# Patient Record
Sex: Male | Born: 1945 | Race: White | Hispanic: No | Marital: Married | Smoking: Former smoker
Health system: Southern US, Community
[De-identification: ages and names within clinical notes are randomized; demographics above are authoritative.]

## PROBLEM LIST (undated history)

## (undated) DIAGNOSIS — G20 Parkinson's disease: Secondary | ICD-10-CM

## (undated) DIAGNOSIS — G20A1 Parkinson's disease without dyskinesia, without mention of fluctuations: Secondary | ICD-10-CM

## (undated) DIAGNOSIS — H269 Unspecified cataract: Secondary | ICD-10-CM

## (undated) DIAGNOSIS — K219 Gastro-esophageal reflux disease without esophagitis: Secondary | ICD-10-CM

## (undated) DIAGNOSIS — I2510 Atherosclerotic heart disease of native coronary artery without angina pectoris: Secondary | ICD-10-CM

## (undated) DIAGNOSIS — Z87442 Personal history of urinary calculi: Secondary | ICD-10-CM

## (undated) DIAGNOSIS — S71139A Puncture wound without foreign body, unspecified thigh, initial encounter: Secondary | ICD-10-CM

## (undated) DIAGNOSIS — I10 Essential (primary) hypertension: Secondary | ICD-10-CM

## (undated) DIAGNOSIS — E785 Hyperlipidemia, unspecified: Secondary | ICD-10-CM

## (undated) DIAGNOSIS — Z9109 Other allergy status, other than to drugs and biological substances: Secondary | ICD-10-CM

## (undated) DIAGNOSIS — W3400XA Accidental discharge from unspecified firearms or gun, initial encounter: Secondary | ICD-10-CM

## (undated) DIAGNOSIS — N200 Calculus of kidney: Secondary | ICD-10-CM

## (undated) HISTORY — PX: LEG WOUND REPAIR / CLOSURE: SUR1143

## (undated) HISTORY — DX: Puncture wound without foreign body, unspecified thigh, initial encounter: S71.139A

## (undated) HISTORY — PX: CARDIAC CATHETERIZATION: SHX172

## (undated) HISTORY — DX: Unspecified cataract: H26.9

## (undated) HISTORY — DX: Accidental discharge from unspecified firearms or gun, initial encounter: W34.00XA

## (undated) HISTORY — DX: Parkinson's disease without dyskinesia, without mention of fluctuations: G20.A1

## (undated) HISTORY — DX: Parkinson's disease: G20

## (undated) HISTORY — PX: COLONOSCOPY: SHX174

## (undated) HISTORY — PX: TONSILLECTOMY AND ADENOIDECTOMY: SHX28

---

## 1998-10-28 ENCOUNTER — Emergency Department (HOSPITAL_COMMUNITY): Admission: EM | Admit: 1998-10-28 | Discharge: 1998-10-28 | Payer: Self-pay | Admitting: Emergency Medicine

## 2000-05-29 ENCOUNTER — Other Ambulatory Visit: Admission: RE | Admit: 2000-05-29 | Discharge: 2000-05-29 | Payer: Self-pay | Admitting: Orthopedic Surgery

## 2003-06-19 ENCOUNTER — Encounter: Payer: Self-pay | Admitting: Internal Medicine

## 2006-07-30 ENCOUNTER — Ambulatory Visit: Payer: Self-pay | Admitting: Internal Medicine

## 2006-08-07 ENCOUNTER — Ambulatory Visit: Payer: Self-pay | Admitting: Internal Medicine

## 2009-08-01 ENCOUNTER — Encounter (INDEPENDENT_AMBULATORY_CARE_PROVIDER_SITE_OTHER): Payer: Self-pay | Admitting: *Deleted

## 2009-09-24 ENCOUNTER — Encounter (INDEPENDENT_AMBULATORY_CARE_PROVIDER_SITE_OTHER): Payer: Self-pay | Admitting: *Deleted

## 2009-10-03 ENCOUNTER — Telehealth: Payer: Self-pay | Admitting: Internal Medicine

## 2009-10-24 ENCOUNTER — Encounter (INDEPENDENT_AMBULATORY_CARE_PROVIDER_SITE_OTHER): Payer: Self-pay | Admitting: *Deleted

## 2009-10-25 ENCOUNTER — Ambulatory Visit: Payer: Self-pay | Admitting: Internal Medicine

## 2009-11-01 ENCOUNTER — Ambulatory Visit (HOSPITAL_COMMUNITY): Admission: RE | Admit: 2009-11-01 | Discharge: 2009-11-01 | Payer: Self-pay | Admitting: Internal Medicine

## 2009-11-01 ENCOUNTER — Ambulatory Visit: Payer: Self-pay | Admitting: Internal Medicine

## 2010-04-27 ENCOUNTER — Emergency Department (HOSPITAL_COMMUNITY): Admission: EM | Admit: 2010-04-27 | Discharge: 2010-04-27 | Payer: Self-pay | Admitting: Emergency Medicine

## 2010-09-10 NOTE — Letter (Signed)
Summary: Previsit letter  [facility]  [address] [place], [place] [address]   Phone: [phone]  Fax: [phone]       09/24/2009 MRN: [id number]  Randall Moore [address] [place], [place]  [address]  Dear [name],  Welcome to the Gastroenterology Division at [facility].    You are scheduled to see a nurse for your pre-procedure visit on 10-25-09 at 1:00p.m. on the 3rd floor at [facility], [address]  We ask that you try to arrive at our office 15 minutes prior to your appointment time to allow for check-in.  Your nurse visit will consist of discussing your medical and surgical history, your immediate family medical history, and your medications.    Please bring a complete list of all your medications or, if you prefer, bring the medication bottles and we will list them.  We will need to be aware of both prescribed and over the counter drugs.  We will need to know exact dosage information as well.  If you are on blood thinners (Coumadin, Plavix, Aggrenox, Ticlid, etc.) please call our office today/prior to your appointment, as we need to consult with your physician about holding your medication.   Please be prepared to read and sign documents such as consent forms, a financial agreement, and acknowledgement forms.  If necessary, and with your consent, a friend or relative is welcome to sit-in on the nurse visit with you.  Please bring your insurance card so that we may make a copy of it.  If your insurance requires a referral to see a specialist, please bring your referral form from your primary care physician.  No co-pay is required for this nurse visit.     If you cannot keep your appointment, please call [phone] to cancel or reschedule prior to your appointment date.  This allows [place] the opportunity to schedule an appointment for another patient in need of care.    Thank you for choosing [facility] for your medical needs.   We appreciate the opportunity to care for you.  Please visit [place] at our website  to learn more about our practice.                     Sincerely.                                                                                                                   The Gastroenterology Division

## 2010-09-10 NOTE — Procedures (Signed)
Summary: Colonoscopy   Colonoscopy  Procedure date:  08/07/2006  Findings:      Location:  [facility].  Results: Normal.   Comments:      Repeat colonoscopy in 3 years.     Procedures Next Due Date:    Colonoscopy: 08/2009 Patient Name: Randall Moore, Randall Moore. MRN:  Procedure Procedures: Colonoscopy CPT: [phone].  Personnel: Endoscopist: Wilhemina Bonito. Marina Goodell, MD.  Exam Location: Exam performed in Outpatient Clinic. Outpatient  Patient Consent: Procedure, Alternatives, Risks and Benefits discussed, consent obtained, from patient. Consent was obtained by the RN.  Indications  Surveillance of: Adenomatous Polyp(s). This is an initial surveillance exam. Initial polypectomy was performed in 2004. in Nov. 1-2 Polyps were found at Index Exam. Largest polyp removed was 6 to 9 mm. Prior polyp located in proximal (splenic flexure and beyond) colon. Pathology of worst  polyp: tubular adenoma.  History  Current Medications: Patient is not currently taking Coumadin.  Pre-Exam Physical: Performed Aug 07, 2006. Cardio-pulmonary exam, Rectal exam, HEENT exam , Abdominal exam, Mental status exam WNL.  Comments: Pt. history reviewed/updated, physical exam performed prior to initiation of sedation?yes Exam Exam: Extent of exam reached: Cecum, extent intended: Cecum.  The cecum was identified by IC valve. Patient position: on left side. Time to Cecum: 00:17:25. Time for Withdrawl: 00:10:36. Colon retroflexion performed. Images taken. ASA Classification: I. Tolerance: poor.  Monitoring: Pulse and BP monitoring, Oximetry used. Supplemental O2 given.  Colon Prep Used Movi prep for colon prep. Prep results: fair, exam compromised.  Sedation Meds: Patient assessed and found to be appropriate for moderate (conscious) sedation. Fentanyl 125 mcg. given IV. Versed 12 given IV.  Comments:    Findings NORMAL EXAM: Cecum to Rectum.    Comments: Exam significantly compromised due to  patient combativeness and inadequate prep Assessment Normal examination.  Comments: SEE LIMITATIONS ABOVE Events  Unplanned Interventions: No intervention was required.  Unplanned Events: There were no complications. Plans Disposition: After procedure patient sent to recovery. After recovery patient sent home.  Scheduling/Referral: Colonoscopy, to Wilhemina Bonito. Marina Goodell, MD, in 3 years (consider osmo prep and anesthesia assisted sedation),    This report was created from the original endoscopy report, which was reviewed and signed by the above listed endoscopist.   cc:  [facility].      The Patient

## 2010-09-10 NOTE — Miscellaneous (Signed)
Summary: [facility] PV  Clinical Lists Changes  Medications: Added new medication of OSMOPREP 1.102-0.398 GM  TABS (SOD PHOS MONO-SOD PHOS DIBASIC) As per prep instructions. - Signed Rx of OSMOPREP 1.102-0.398 GM  TABS (SOD PHOS MONO-SOD PHOS DIBASIC) As per prep instructions.;  #32 x 0;  Signed;  Entered by: Ezra Sites RN;  Authorized by: Hilarie Fredrickson MD;  Method used: [facility]  [place] [address], [place], [place]  [address], Ph: [phone] or [phone], Fax: [phone] Allergies: Added new allergy or adverse reaction of PCN Added new allergy or adverse reaction of IBUPROFEN Observations: Added new observation of NKA: F (10/25/2009 12:18)    Prescriptions: OSMOPREP 1.102-0.398 GM  TABS (SOD PHOS MONO-SOD PHOS DIBASIC) As per prep instructions.  #32 x 0   Entered by:   Ezra Sites RN   Authorized by:   Hilarie Fredrickson MD   Signed by:   Ezra Sites RN on 10/25/2009   Method used:   [facility]        [facility]  [place] [address] (retail)       [address]       [place], [place]  [address]       Ph: [phone] or [phone]       Fax: [phone]   RxID:   [id number]

## 2010-09-10 NOTE — Progress Notes (Signed)
Summary: Question about being put under for Recall Colon  Phone Note Call from Patient Call back at [phone] cell   Call For: Dr Marina Goodell Summary of Call: Scheduled his Recall Colon but believes that at his last procedure Dr Marina Goodell told him he might need to be put completely under for the next Colon. Should this be done at the hospital?  Initial call taken by: Leanor Kail [facility],  October 03, 2009 4:31 PM  Follow-up for Phone Call         Chart requested  Teryl Lucy RN  October 04, 2009 9:03 AM  Chart reviewed by Dr.Marva Hendryx and pt. will need colon under Propofol Dr.Deslyn Cavenaugh's next hospital wk.if there is an opening. OK with pt.  Teryl Lucy RN  October 04, 2009 12:52 PM  Pt. ntfd. that colon changed to [facility] on 11/01/2009 at 12:30pm  with Propofol.Instructed to keep PV appt. that is all ready scheduled. Follow-up by: Teryl Lucy RN,  October 04, 2009 3:27 PM

## 2010-09-10 NOTE — Letter (Signed)
Summary: Osmoprep Instructions  [facility]  [address] [place], [place] [address]   Phone: [phone]  Fax: [phone]       Randall Moore    1946/01/17    MRN: [id number]        Procedure Day /Date:  Thursday 11/01/2009     Arrival Time: 11:30 am     Procedure Time: 12:30 pm     Location of Procedure:                     _x _  [facility] ( Outpatient Registration)     PREPARATION FOR COLONOSCOPY WITH OSMOPREP  Starting 5 days prior to your procedure Saturday 3/19 do not eat nuts, seeds, popcorn, corn, beans, peas,  salads, or any raw vegetables.  Do not take any fiber supplements (e.g. Metamucil, Citrucel, and Benefiber). _________________________________________________________________________________________________  THE DAY BEFORE YOUR PROCEDURE             DATE: Wednesday 3/23  1.   Drink clear liquids the entire day - NO SOLID FOOD.  Drink at least 64 oz. of fluid during the day to prevent hydration and help the prep work efficiently.    2.   Do not drink anything colored red or purple.  Avoid juices with pulp.  No orange juice.              CLEAR LIQUIDS INCLUDE: Water Jello Ice Popsicles Tea (sugar ok, no milk/cream) Powdered fruit flavored drinks Coffee (sugar ok, no milk/cream) Gatorade Juice: apple, white grape, white cranberry  Lemonade Clear bullion, consomm, broth Carbonated beverages (any kind) Strained chicken noodle soup Hard Candy   3.   Beginning at 5:00 p.m. or 6:00 p.m. the night before your procedure, drink one dose (4 tablets with 8 oz. of any clear liquid) every 15 minutes for a total of 5 doses (20 tablets total).  ___________________________________________________________________________________________________   THE DAY OF YOUR PROCEDURE            DATE: Thursday 3/24  1.   Beginning at 7:30 am  (5 hours before procedure), drink one dose (4 tablets with 8 oz. of any clear liquid) every 15 minutes for a  total of 3 doses (12 tablets).  2.   Nothing to eat or drink after midnight except for prep.       MEDICATION INSTRUCTIONS  Unless otherwise instructed, you should take regular prescription medications with a small sip of water as early as possible the morning of your procedure.            OTHER INSTRUCTIONS  You will need a responsible adult at least 64 years of age to accompany you and drive you home.   This person must remain in the waiting room during your procedure.  Wear loose fitting clothing that is easily removed.  Leave jewelry and other valuables at home.  However, you may wish to bring a book to read or an iPod/MP3 player to listen to music as you wait for your procedure to start.  Remove all body piercing jewelry and leave at home.  Total time from sign-in until discharge is approximately 2-3 hours.  You should go home directly after your procedure and rest.  You can resume normal activities the day after your procedure.  The day of your procedure you should not:   Drive   Make legal decisions   Operate machinery   Drink alcohol   Return to work  [name]  will receive specific instructions about eating, activities and medications before you leave.   The above instructions have been reviewed and explained to me by  Ezra Sites RN  October 25, 2009 12:45 PM     I fully understand and can verbalize these instructions _____________________________ Date _________

## 2010-09-10 NOTE — Procedures (Signed)
Summary: colonoscopy   Colonoscopy  Procedure date:  06/19/2003  Findings:      Location:  [facility].  Results: Polyp.   Patient Name: Randall Moore, Randall Moore. MRN:  Procedure Procedures: Colonoscopy CPT: [phone].    with polypectomy. CPT: [id number].  Personnel: Endoscopist: Wilhemina Bonito. Marina Goodell, MD.  Referred By: Teena Irani. Arlyce Dice, MD.  Exam Location: Exam performed in Outpatient Clinic. Outpatient  Patient Consent: Procedure, Alternatives, Risks and Benefits discussed, consent obtained, from patient. Consent was obtained by the RN.  Indications  Average Risk Screening Routine.  History  Current Medications: Patient is not currently taking Coumadin.  Pre-Exam Physical: Performed Jun 19, 2003. Entire physical exam was normal.  Exam Exam: Extent of exam reached: Cecum, extent intended: Cecum.  The cecum was identified by appendiceal orifice and IC valve. Patient position: on left side. Colon retroflexion performed. Images taken. ASA Classification: II. Tolerance: excellent.  Monitoring: Pulse and BP monitoring, Oximetry used. Supplemental O2 given.  Colon Prep Used Golytely for colon prep. Prep results: excellent.  Sedation Meds: Patient assessed and found to be appropriate for moderate (conscious) sedation. Fentanyl 100 mcg. given IV. Versed 15 given IV.  Findings NORMAL EXAM: Cecum to Rectum.  POLYP: Sigmoid Colon, Maximum size: 8 mm. pedunculated polyp. Distance from Anus 30 cm. Procedure:  snare without cautery, removed, retrieved, Polyp sent to pathology. ICD9: Colon Polyps: 211.3.   Assessment Abnormal examination, see findings above.  Diagnoses: 211.3: Colon Polyps.   Events  Unplanned Interventions: No intervention was required.  Unplanned Events: There were no complications. Plans Patient Education: Patient given standard instructions for: Polyps.  Disposition: After procedure patient sent to recovery. After recovery patient sent home.    Scheduling/Referral: Colonoscopy, to Wilhemina Bonito. Marina Goodell, MD, in 3 years if polyp adenomatous; otherwise 5 years.,    This report was created from the original endoscopy report, which was reviewed and signed by the above listed endoscopist.   cc: Dara Hoyer, MD     The Patient

## 2010-09-10 NOTE — Procedures (Signed)
Summary: Colonoscopy  Patient: [name] Note: All result statuses are Final unless otherwise noted.  Tests: (1) Colonoscopy (COL)   COL Colonoscopy           DONE     [facility]     [address] [place], [place]  [address]           COLONOSCOPY PROCEDURE REPORT           PATIENT:  Randall Moore, Randall Moore  MR#:  [id number]     BIRTHDATE:  September 25, 1945, 63 yrs. old  GENDER:  male     ENDOSCOPIST:  Wilhemina Bonito. Eda Keys, MD     REF. BY:  Surveillance Program Recall     PROCEDURE DATE:  11/01/2009     PROCEDURE:  Surveillance Colonoscopy     ASA CLASS:  Class II     INDICATIONS:  history of pre-cancerous (adenomatous) colon polyps     ; INDEX 2004 TUBULAR ADENOMA; 2007 FAIR PREP     MEDICATIONS:   MAC sedation, administered by CRNA           DESCRIPTION OF PROCEDURE:   After the risks benefits and     alternatives of the procedure were thoroughly explained, informed     consent was obtained.  Digital rectal exam was performed and     revealed no abnormalities.   The EC-[id number] ([id number]) endoscope was     introduced through the anus and advanced to the cecum, which was     identified by both the appendix and ileocecal valve, without     limitations.  The quality of the prep was good, using Osmoprep.     The instrument was then slowly withdrawn as the colon was fully     examined.     <<PROCEDUREIMAGES>>           FINDINGS:  A normal appearing cecum, ileocecal valve, and     appendiceal orifice were identified. The ascending, hepatic     flexure, transverse, splenic flexure, descending, sigmoid colon,     and rectum appeared unremarkable.  No polyps or cancers were seen.     Retroflexed views in the rectum revealed internal hemorrhoids.     The scope was then withdrawn from the patient and the procedure     completed.           COMPLICATIONS:  None     ENDOSCOPIC IMPRESSION:     1) Normal colon     2) No polyps or cancers     3) Small Internal hemorrhoids        RECOMMENDATIONS:     1) Follow up colonoscopy in 5 years WITH MAC           ______________________________     Wilhemina Bonito. Eda Keys, MD           CC:  The Patient; [facility]           n.     eSIGNED:   Wilhemina Bonito. Eda Keys at 11/01/2009 01:33 PM           Charlestine Massed, [id number]  Note: An exclamation mark (!) indicates a result that was not dispersed into the flowsheet. Document Creation Date: 11/01/2009 1:33 PM _______________________________________________________________________  (1) Order result status: Final Collection or observation date-time: 11/01/2009 13:21 Requested date-time:  Receipt date-time:  Reported date-time:  Referring Physician:   Ordering Physician: Fransico Setters [phone]) Specimen Source:  Source: Launa Grill  Order Number: [id number] Lab site:

## 2010-10-24 LAB — CBC
MCH: 34.7 pg — ABNORMAL HIGH (ref 26.0–34.0)
MCV: 97.3 fL (ref 78.0–100.0)
Platelets: 178 10*3/uL (ref 150–400)
RDW: 13 % (ref 11.5–15.5)

## 2010-10-24 LAB — POCT I-STAT, CHEM 8
BUN: 25 mg/dL — ABNORMAL HIGH (ref 6–23)
Calcium, Ion: 1.08 mmol/L — ABNORMAL LOW (ref 1.12–1.32)
Creatinine, Ser: 1.6 mg/dL — ABNORMAL HIGH (ref 0.4–1.5)
TCO2: 24 mmol/L (ref 0–100)

## 2010-10-24 LAB — URINALYSIS, ROUTINE W REFLEX MICROSCOPIC
Bilirubin Urine: NEGATIVE
Ketones, ur: 15 mg/dL — AB
Nitrite: POSITIVE — AB
Urobilinogen, UA: 1 mg/dL (ref 0.0–1.0)

## 2011-01-15 ENCOUNTER — Encounter: Payer: Self-pay | Admitting: Internal Medicine

## 2011-09-20 ENCOUNTER — Encounter (HOSPITAL_COMMUNITY): Payer: Self-pay | Admitting: Emergency Medicine

## 2011-09-20 ENCOUNTER — Emergency Department (HOSPITAL_COMMUNITY): Payer: 59

## 2011-09-20 ENCOUNTER — Ambulatory Visit (INDEPENDENT_AMBULATORY_CARE_PROVIDER_SITE_OTHER): Payer: 59 | Admitting: Family Medicine

## 2011-09-20 ENCOUNTER — Observation Stay (HOSPITAL_COMMUNITY)
Admission: EM | Admit: 2011-09-20 | Discharge: 2011-09-21 | Disposition: A | Discharge status: Home / Self Care | Payer: 59 | Attending: Emergency Medicine | Admitting: Emergency Medicine

## 2011-09-20 ENCOUNTER — Other Ambulatory Visit: Payer: Self-pay

## 2011-09-20 DIAGNOSIS — E7800 Pure hypercholesterolemia, unspecified: Secondary | ICD-10-CM | POA: Insufficient documentation

## 2011-09-20 DIAGNOSIS — R079 Chest pain, unspecified: Secondary | ICD-10-CM

## 2011-09-20 DIAGNOSIS — K635 Polyp of colon: Secondary | ICD-10-CM | POA: Insufficient documentation

## 2011-09-20 DIAGNOSIS — N442 Benign cyst of testis: Secondary | ICD-10-CM | POA: Insufficient documentation

## 2011-09-20 DIAGNOSIS — H538 Other visual disturbances: Secondary | ICD-10-CM | POA: Insufficient documentation

## 2011-09-20 DIAGNOSIS — R0789 Other chest pain: Secondary | ICD-10-CM

## 2011-09-20 DIAGNOSIS — E785 Hyperlipidemia, unspecified: Secondary | ICD-10-CM | POA: Insufficient documentation

## 2011-09-20 DIAGNOSIS — G453 Amaurosis fugax: Secondary | ICD-10-CM

## 2011-09-20 DIAGNOSIS — K8020 Calculus of gallbladder without cholecystitis without obstruction: Secondary | ICD-10-CM

## 2011-09-20 DIAGNOSIS — H3400 Transient retinal artery occlusion, unspecified eye: Secondary | ICD-10-CM

## 2011-09-20 DIAGNOSIS — Z87442 Personal history of urinary calculi: Secondary | ICD-10-CM | POA: Insufficient documentation

## 2011-09-20 DIAGNOSIS — J309 Allergic rhinitis, unspecified: Secondary | ICD-10-CM | POA: Insufficient documentation

## 2011-09-20 HISTORY — DX: Other allergy status, other than to drugs and biological substances: Z91.09

## 2011-09-20 HISTORY — DX: Calculus of kidney: N20.0

## 2011-09-20 HISTORY — DX: Hyperlipidemia, unspecified: E78.5

## 2011-09-20 LAB — BASIC METABOLIC PANEL
BUN: 15 mg/dL (ref 6–23)
Calcium: 9.5 mg/dL (ref 8.4–10.5)
GFR calc Af Amer: 83 mL/min — ABNORMAL LOW (ref >90)
GFR calc non Af Amer: 71 mL/min — ABNORMAL LOW (ref >90)
Potassium: 3.9 mEq/L (ref 3.5–5.1)
Sodium: 139 mEq/L (ref 135–145)

## 2011-09-20 LAB — CBC
HCT: 38 % — ABNORMAL LOW (ref 39.0–52.0)
MCHC: 35 g/dL (ref 30.0–36.0)
RDW: 12.8 % (ref 11.5–15.5)

## 2011-09-20 LAB — POCT I-STAT TROPONIN I: Troponin i, poc: 0 ng/mL (ref 0.00–0.08)

## 2011-09-20 MED ORDER — NITROGLYCERIN 0.4 MG SL SUBL
0.4000 mg | SUBLINGUAL_TABLET | SUBLINGUAL | Status: DC | PRN
  Administered 2011-09-21: 0.4 mg via SUBLINGUAL
  Filled 2011-09-20 (×2): qty 25

## 2011-09-20 MED ORDER — NITROGLYCERIN 0.4 MG SL SUBL: 0.4000 mg | SUBLINGUAL_TABLET | SUBLINGUAL | Status: DC | PRN

## 2011-09-20 NOTE — ED Notes (Signed)
Pt reports this am around 830 he had blurred vision to right eye and a pressure behind his right eye. Pt states while he was at the urgent care he started having midsternal chest pressure. Pt reports he feels like he has to concentrate hard. Pt reports he had mild nausea and right facial numbness that has resolved. Pt was given 324asa. 1 nitro given. 20g(L)AC. 170/100 initial blood pressure.

## 2011-09-20 NOTE — ED Notes (Signed)
EKG given to Dr.J, no old EKG on file. Copy placed in chart.

## 2011-09-20 NOTE — ED Provider Notes (Signed)
Medical screening examination/treatment/procedure(s) were conducted as a shared visit with non-physician practitioner(s) and myself.  I personally evaluated the patient during the encounter  Yeimy Brabant, MD 09/20/11 1712 

## 2011-09-20 NOTE — ED Notes (Signed)
EDP at bedside  

## 2011-09-20 NOTE — Progress Notes (Signed)
66 yo former smoker who works for [employer].  He experienced sudden vision loss right eye this morning associated with dizziness and some chest pressure at 8:30 am.  He has subsequently felt all symptoms gradually resolve, although the vision is not back to normal yet.  Had mild nausea (unusual for him).  Also felt right facial numbness and lost his balance for awhile No h/o heart disease or CVA.  No prior amaurosis Took ASA 81 mg this am. Is treated for hyperlipidemia. F/H mother has a.fib  Patient seen urgently  O:  Alert, NAD, cooperative, appropriate, oriented CN:  Intact Motor:  Equal 4 extrem HEENT unremarkable Eyes:  Mild pallor right fundus.  EOMI, PERRLA Heart:  Reg, no tachycardia, no murmur Neck:  No bruit, no thyromegaly Chest:  Clear Abd: soft nontender without HSM Ext:  No edema  A: acute amaurosis with new chest tightness and nausea.  Suspect TIA +/- cardiac event  P:  IV access ASA 325 mg given Emergency tx to ED
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
## 2011-09-20 NOTE — ED Notes (Signed)
Pt resting quietly at present time. Denies any chest pain or blurred vision.

## 2011-09-20 NOTE — ED Notes (Signed)
Pt currently denies any chest pain or blurred vision. Pt denies any c/o at this time.

## 2011-09-20 NOTE — ED Provider Notes (Addendum)
Subjective :Developed blurred vision in both eyes right greater than left , and difficulty concentrating onset830 am this morning followed by anterior chest discomfort which is vague which was alleviated after one sublingual nitroglycerin and 3 baby aspirins. Visual problem has resolved completely Presently discomfort is minimal cardiac risk factors hypercholesterolemia. Obecttive On exam no distress lungs clear to auscultation heart regular rate and rhythm no murmurs. Neurologic cranial nerves II through XII grossly intact moves all extremities well motor is grossly 5 over 5 overall.  ED course the patient became pain-free after he received a second sublingual nitroglycerin tablet in the emergency department Assessment symptoms highly atypical for TIA . Plan :Based on symptoms will place in CDU for cardiac rule out protocol   Doug Sou, MD 09/20/11 1547  Doug Sou, MD 09/20/11 1719

## 2011-09-20 NOTE — ED Notes (Signed)
Received report assumed patient care walking rounds completed.  Patient sitting up in bed pain free,  Denies needs.

## 2011-09-20 NOTE — ED Provider Notes (Signed)
Patient is resting comfortably in bed without complaint. VSS. Patient is on CP protocol pending CT coronary for the morning. He he agreeable to protocol and voices understanding to alert staff if return of CP. Sign out given by Dr. Sherri Sear.   Jenness Corner, PA 09/20/11 1709  Jenness Corner, PA 09/20/11 2033

## 2011-09-21 ENCOUNTER — Other Ambulatory Visit: Payer: Self-pay

## 2011-09-21 ENCOUNTER — Encounter (HOSPITAL_COMMUNITY): Payer: Self-pay | Admitting: Physician Assistant

## 2011-09-21 ENCOUNTER — Observation Stay (HOSPITAL_COMMUNITY): Payer: 59

## 2011-09-21 MED ORDER — METOPROLOL TARTRATE 25 MG PO TABS
50.0000 mg | ORAL_TABLET | Freq: Once | ORAL | Status: AC
  Administered 2011-09-21: 50 mg via ORAL
  Filled 2011-09-21: qty 2

## 2011-09-21 MED ORDER — METOPROLOL TARTRATE 1 MG/ML IV SOLN
5.0000 mg | INTRAVENOUS | Status: DC | PRN
  Filled 2011-09-21: qty 10

## 2011-09-21 MED ORDER — IOHEXOL 350 MG/ML SOLN
80.0000 mL | Freq: Once | INTRAVENOUS | Status: AC | PRN
  Administered 2011-09-21: 80 mL via INTRAVENOUS

## 2011-09-21 NOTE — Consult Note (Signed)
Patient examined chart reviewed.  Reviewed cardiac CT.  Nonobstructive plaque in LAD 30% or less RCA and circumflex normal.  Neuro symptoms more concerning.  Plan per ER for that.  However I encouraged patient to F/U with Dr Jamelle Haring who has done lens work on him before.  Check eyes for embolic event.  Will do carotid dopplers in our office.  I will see him in 2 weeks and recheck BP and make sure he has F/U as he goes to the [place] when needed and has no primary in [place].  He has been seen at [facility] urgent care and I encouraged him to F/U there for primary care.  ASA for now.  [facility]  CT negative for acute event and no carotid bruits.

## 2011-09-21 NOTE — ED Notes (Signed)
Cardiology coming to see pt about results of ct scan.

## 2011-09-21 NOTE — ED Provider Notes (Signed)
Medical screening examination/treatment/procedure(s) were conducted as a shared visit with non-physician practitioner(s) and myself.  I personally evaluated the patient during the encounter  Doug Sou, MD 09/21/11 1434
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
## 2011-09-21 NOTE — ED Notes (Signed)
Transported to CT for Cardiac CT via w/c w/telemetry.

## 2011-09-21 NOTE — ED Notes (Signed)
Returned from CT. Pt tolerated well.

## 2011-10-01 NOTE — Progress Notes (Signed)
Observation review is complete for 09/20/2011.

## 2011-10-10 ENCOUNTER — Ambulatory Visit (INDEPENDENT_AMBULATORY_CARE_PROVIDER_SITE_OTHER): Payer: PRIVATE HEALTH INSURANCE | Admitting: Internal Medicine

## 2011-10-10 ENCOUNTER — Encounter: Payer: Self-pay | Admitting: Internal Medicine

## 2011-10-10 VITALS — BP 134/83 | HR 84 | Ht 71.0 in | Wt 183.0 lb

## 2011-10-10 DIAGNOSIS — H547 Unspecified visual loss: Secondary | ICD-10-CM

## 2011-10-10 DIAGNOSIS — I2510 Atherosclerotic heart disease of native coronary artery without angina pectoris: Secondary | ICD-10-CM

## 2011-10-10 DIAGNOSIS — E785 Hyperlipidemia, unspecified: Secondary | ICD-10-CM
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
## 2011-10-10 NOTE — Patient Instructions (Signed)
Your physician has requested that you have a carotid duplex. This test is an ultrasound of the carotid arteries in your neck. It looks at blood flow through these arteries that supply the brain with blood. Allow one hour for this exam. There are no restrictions or special instructions. 

## 2011-10-10 NOTE — Progress Notes (Signed)
Patient ID: Randall Moore, male   DOB: 1945-08-30, 66 y.o.   MRN: [id number]

## 2011-10-13 DIAGNOSIS — H547 Unspecified visual loss: Secondary | ICD-10-CM | POA: Insufficient documentation

## 2011-10-13 DIAGNOSIS — E785 Hyperlipidemia, unspecified: Secondary | ICD-10-CM | POA: Insufficient documentation

## 2011-10-13 DIAGNOSIS — I2510 Atherosclerotic heart disease of native coronary artery without angina pectoris: Secondary | ICD-10-CM | POA: Insufficient documentation

## 2011-10-13 NOTE — Assessment & Plan Note (Signed)
Needs to have carotid USN.  Should also be seen in ophthy, possibly neuro.   Keep on ASA

## 2011-10-13 NOTE — Assessment & Plan Note (Signed)
Patient with mild chol plaquing in coronaries.  I do not think his symptoms related to above event.   Does need aggressive control of risk factors.

## 2011-10-13 NOTE — Assessment & Plan Note (Signed)
Needs to have fasting lipids.

## 2011-10-27 ENCOUNTER — Encounter (INDEPENDENT_AMBULATORY_CARE_PROVIDER_SITE_OTHER): Payer: 59

## 2011-10-27 DIAGNOSIS — I6529 Occlusion and stenosis of unspecified carotid artery: Secondary | ICD-10-CM

## 2011-10-27 DIAGNOSIS — H53129 Transient visual loss, unspecified eye: Secondary | ICD-10-CM

## 2011-10-27 DIAGNOSIS — R209 Unspecified disturbances of skin sensation: Secondary | ICD-10-CM

## 2011-10-27 DIAGNOSIS — H547 Unspecified visual loss: Secondary | ICD-10-CM

## 2012-11-16 ENCOUNTER — Encounter (INDEPENDENT_AMBULATORY_CARE_PROVIDER_SITE_OTHER): Payer: 59

## 2012-11-16 DIAGNOSIS — I6529 Occlusion and stenosis of unspecified carotid artery: Secondary | ICD-10-CM

## 2013-04-06 ENCOUNTER — Ambulatory Visit (INDEPENDENT_AMBULATORY_CARE_PROVIDER_SITE_OTHER): Payer: Self-pay | Admitting: General Surgery

## 2013-04-07 ENCOUNTER — Encounter (INDEPENDENT_AMBULATORY_CARE_PROVIDER_SITE_OTHER): Payer: Self-pay | Admitting: General Surgery

## 2013-04-07 ENCOUNTER — Ambulatory Visit (INDEPENDENT_AMBULATORY_CARE_PROVIDER_SITE_OTHER): Payer: 59 | Admitting: General Surgery

## 2013-04-07 VITALS — BP 160/92 | HR 72 | Temp 96.7°F | Resp 14 | Ht 72.0 in | Wt 175.2 lb

## 2013-04-07 DIAGNOSIS — M6200 Separation of muscle (nontraumatic), unspecified site: Secondary | ICD-10-CM

## 2013-04-07 DIAGNOSIS — M6208 Separation of muscle (nontraumatic), other site: Secondary | ICD-10-CM | POA: Insufficient documentation

## 2013-04-07 NOTE — Patient Instructions (Signed)
The bulge in your upper abdomen is a condition called diastases recti.  This is not a true hernia, and we do not recommend surgery to repair this.  You have been given a prescription for a Velcro elastic abdominal binder to see if that helps with the discomfort.  Return to see Dr. Derrell Lolling if your symptoms progress or if the bulge progresses.
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
## 2013-06-21 ENCOUNTER — Encounter: Payer: Self-pay | Admitting: Family Medicine

## 2013-12-05 ENCOUNTER — Other Ambulatory Visit (HOSPITAL_COMMUNITY): Payer: Self-pay | Admitting: Cardiology

## 2013-12-05 DIAGNOSIS — I6529 Occlusion and stenosis of unspecified carotid artery: Secondary | ICD-10-CM

## 2013-12-12 ENCOUNTER — Ambulatory Visit (HOSPITAL_COMMUNITY): Payer: 59 | Attending: Cardiovascular Disease | Admitting: Cardiology

## 2013-12-12 DIAGNOSIS — I6529 Occlusion and stenosis of unspecified carotid artery: Secondary | ICD-10-CM

## 2013-12-12 DIAGNOSIS — I2510 Atherosclerotic heart disease of native coronary artery without angina pectoris: Secondary | ICD-10-CM | POA: Insufficient documentation

## 2013-12-12 DIAGNOSIS — E785 Hyperlipidemia, unspecified: Secondary | ICD-10-CM | POA: Insufficient documentation

## 2013-12-12 DIAGNOSIS — I658 Occlusion and stenosis of other precerebral arteries: Secondary | ICD-10-CM | POA: Insufficient documentation

## 2013-12-12 DIAGNOSIS — Z87891 Personal history of nicotine dependence: Secondary | ICD-10-CM | POA: Insufficient documentation

## 2013-12-12 NOTE — Progress Notes (Signed)
Carotid duplex complete 

## 2016-03-19 DIAGNOSIS — R079 Chest pain, unspecified: Secondary | ICD-10-CM | POA: Diagnosis present

## 2016-03-19 NOTE — H&P (Signed)
OFFICE VISIT NOTES COPIED TO EPIC FOR DOCUMENTATION  . History of Present Illness Randall Moore AGNP-C; 03/19/2016 11:48 AM) The patient is a 70 year old male who presents with chest pain. He has a history of hyperlipidemia. He saw his PCP this morning for evaluation of intermittent episodes of palpitations and chest pain over the past 3-4 days. Patient typically very active and runs several miles regularly, however is now experiencing chest discomfort with even minimal amounts of exertion such as walking across the parking lot into our office. Denies any associated shortness of breath, nausea, diaphoresis, or dizziness. Denies any radiation of pain. Symptoms resolve with rest. He describes palpitations as a sensation of a skipped beat and feels a fullness in his chest or head during this. He denies any PND, orthopnea, edema, dizziness, syncope, or symptoms suggestive of TIA. He also reports symptoms suggestive of claudication in his right thigh with exertion.   Problem List/Past Medical Randall Moore; 04/08/2016 3:47 PM) Hypercholesteremia (E78.00)  Seasonal allergies (J30.2)  History of kidney stones [id number])   Allergies Randall Moore; April 08, 2016 3:40 PM) Penicillins  Ibuprofen *ANALGESICS - ANTI-INFLAMMATORY*   Family History Randall Moore; 2016/04/08 3:42 PM) Mother  Deceased. at age 59, afib Father  Deceased. at age 54, mesothelioma, no known heart conditions Sister 1  1 year younger, Hypercholesteremia  Social History Randall Moore; 2016/04/08 3:43 PM) Current tobacco use  quit in 1976, smoked for about 8 years Non Drinker/No Alcohol Use  Marital status  Married. Living Situation  Lives with spouse. Number of Children  2.  Past Surgical History Randall Moore; 2016/04/08 3:44 PM) Adenoidectomy 1958 Tonsillectomy 1958  Medication History Randall Moore; April 08, 2016 3:54 PM) Simvastatin (40MG  Tablet, 1 Oral daily) Active. Clarinex (5MG   Tablet, 1 Oral daily) Active. Clobetasol Propionate (0.05% Ointment, External as needed) Active. (for bug bites) Omega 3 (1 gram Oral daily) Specific dose unknown - Active. Aspirin (81MG  Tablet DR, 1 Oral daily) Active. Saw Palmetto (1 Oral daily) Specific dose unknown - Active. Multivital (1 Oral daily) Active. Calcium Acetate (500 mg Oral daily) Specific dose unknown - Active. Desloratadine (5MG  Tablet, 1 Oral daily) Active. Docusate Sodium (1 Oral daily) Specific dose unknown - Active. Ferrous Sulfate (1 Oral daily) Specific dose unknown - Active. Medications Reconciled (List present)  Diagnostic Studies History Randall Moore, AGNP-C; 08-Apr-2016 4:12 PM) Carotid Doppler 12/12/2013 1-39% bilateral ICA stenosis, mild right subclavian artery stenosis Colonoscopy 2008 Normal.    Review of Systems (Randall Moore AGNP-C; 2016-04-08 4:15 PM) General Not Present- Anorexia, Fatigue and Fever. Respiratory Not Present- Cough, Decreased Exercise Tolerance, Difficulty Breathing on Exertion and Dyspnea. Cardiovascular Present- Chest Pain and Claudications (right thigh). Not Present- Edema, Orthopnea, Palpitations and Paroxysmal Nocturnal Dyspnea. Gastrointestinal Not Present- Black, Tarry Stool, Change in Bowel Habits and Nausea. Neurological Not Present- Focal Neurological Symptoms and Syncope. Endocrine Not Present- Cold Intolerance, Excessive Sweating, Heat Intolerance and Thyroid Problems. Hematology Not Present- Anemia, Easy Bruising, Petechiae and Prolonged Bleeding.  Vitals Randall Moore; April 08, 2016 3:56 PM) Apr 08, 2016 3:25 PM Weight: 173.44 lb Height: 72in Body Surface Area: 2.01 m Body Mass Index: 23.52 kg/m  Pulse: 65 (Regular)  P.OX: 98% (Room air) BP: 140/80 (Sitting, Left Arm, Standard)       Physical Exam (Randall Moore AGNP-C; 2016-04-08 4:16 PM) General Mental Status-Alert. General Appearance-Cooperative, Appears stated age, Not in  acute distress. Build & Nutrition-Moderately built.  Head and Neck Thyroid Gland Characteristics - no palpable nodules, no palpable enlargement.  Chest and Lung Exam Palpation Tender - No  chest wall tenderness. Auscultation Breath sounds - Clear.  Cardiovascular Inspection Jugular vein - Right - No Distention. Auscultation Heart Sounds - S1 WNL, S2 WNL and No gallop present. Murmurs & Other Heart Sounds - Murmur - No murmur.  Abdomen Palpation/Percussion Palpation and Percussion of the abdomen reveal - Non Tender and No hepatosplenomegaly. Auscultation Auscultation of the abdomen reveals - Bowel sounds normal.  Peripheral Vascular Lower Extremity Inspection - Bilateral - Inspection Normal. Palpation - Edema - Bilateral - No edema. Femoral pulse - Bilateral - Normal. Popliteal pulse - Bilateral - Normal. Dorsalis pedis pulse - Bilateral - Normal. Posterior tibial pulse - Left - Normal. Right - 1+. Carotid arteries - Bilateral-No Carotid bruit. Abdomen-No prominent abdominal aortic pulsation, No epigastric bruit.  Neurologic Neurologic evaluation reveals -alert and oriented x 3 with no impairment of recent or remote memory. Motor-Grossly intact without any focal deficits.  Musculoskeletal Global Assessment Left Lower Extremity - normal range of motion without pain. Right Lower Extremity - normal range of motion without pain.    Assessment & Plan (Randall Moore AGNP-C; 03/19/2016 11:49 AM) 1. 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 x1. Started Nitroglycerin 0.4MG , 1 (one) Tablet every 5 minutes as needed for chest pain., #25, 03/18/2016, No Refill. Started AmLODIPine Besylate 5MG , 1 (one) Tablet daily, #30, 03/18/2016, Ref. x1. METABOLIC PANEL, BASIC (99991111) CBC & PLATELETS (AUTO) (85027) PT (PROTHROMBIN TIME) (09811) 2. 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 Palpitations (R00.2) 4. Hyperlipidemia, group A (E78.00) Current Plans Started Atorvastatin Calcium 80MG , 1 (one) Tablet daily, #30, 03/18/2016, No Refill. Local Order: d/c simvastatin Discontinued Simvastatin 40MG  (change to atorvastatin). 5. 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 I discussed primary/secondary prevention and also dietary counseling was done. He presents for evaluation of intermittent episodes of chest pain, strongly suggestive of unstable angina pectoris. Symptoms occur with even minimal amounts of exertion such as walking across the parking lot. Given presentation in a patient with established hyperlipidemia and carotid atherosclerosis, would recommend proceeding with coronary angiography for definitive evaluation of coronary anatomy. We will began metoprolol tartrate 12.5 mg twice a day for cardioprotection and amlodipine 5 mg both for hypertension and angina. Was also given sublingual nitroglycerin to use for chest pain not relieved by rest. I have also discontinued simvastatin and changed to 80 mg atorvastatin. Schedule for cardiac catheterization, and possible angioplasty. We discussed regarding risks, benefits, alternatives to this including stress testing, CTA and continued medical therapy. Patient wants to proceed. Understands <1-2% risk of death, stroke, MI, urgent CABG, bleeding, infection, renal failure but not limited to these. Follow up after catheterization for reevaluation and further recommendations.  *I have discussed this case with Dr. Einar Gip and he personally examined the patient and participated in formulating the plan.*  Addendum Note(Randall Moore AGNP-C; 03/19/2016 7:35 PM) 03/19/2016: creatinine 1.11, potassium 4.8, CBC normal, PT/INR normal  Labs stable for coronary angiogram.   Signed by Randall Moore, AGNP-C (03/19/2016 11:49 AM)

## 2016-03-21 ENCOUNTER — Encounter (HOSPITAL_COMMUNITY)
Admission: RE | Disposition: A | Discharge status: Home / Self Care | Payer: Self-pay | Source: Ambulatory Visit | Attending: Cardiology

## 2016-03-21 ENCOUNTER — Ambulatory Visit (HOSPITAL_COMMUNITY)
Admission: RE | Admit: 2016-03-21 | Discharge: 2016-03-21 | Disposition: A | Discharge status: Home / Self Care | Payer: 59 | Source: Ambulatory Visit | Attending: Cardiology | Admitting: Cardiology

## 2016-03-21 DIAGNOSIS — E7800 Pure hypercholesterolemia, unspecified: Secondary | ICD-10-CM | POA: Diagnosis not present

## 2016-03-21 DIAGNOSIS — Z7982 Long term (current) use of aspirin: Secondary | ICD-10-CM | POA: Insufficient documentation

## 2016-03-21 DIAGNOSIS — R079 Chest pain, unspecified: Secondary | ICD-10-CM | POA: Diagnosis present

## 2016-03-21 DIAGNOSIS — J302 Other seasonal allergic rhinitis: Secondary | ICD-10-CM | POA: Insufficient documentation

## 2016-03-21 DIAGNOSIS — I209 Angina pectoris, unspecified: Secondary | ICD-10-CM | POA: Diagnosis present

## 2016-03-21 DIAGNOSIS — E785 Hyperlipidemia, unspecified: Secondary | ICD-10-CM | POA: Insufficient documentation

## 2016-03-21 DIAGNOSIS — I2584 Coronary atherosclerosis due to calcified coronary lesion: Secondary | ICD-10-CM | POA: Diagnosis not present

## 2016-03-21 DIAGNOSIS — Z87891 Personal history of nicotine dependence: Secondary | ICD-10-CM | POA: Diagnosis not present

## 2016-03-21 DIAGNOSIS — I25119 Atherosclerotic heart disease of native coronary artery with unspecified angina pectoris: Secondary | ICD-10-CM | POA: Insufficient documentation

## 2016-03-21 DIAGNOSIS — Z79899 Other long term (current) drug therapy: Secondary | ICD-10-CM | POA: Insufficient documentation

## 2016-03-21 HISTORY — PX: CARDIAC CATHETERIZATION: SHX172

## 2016-03-21 LAB — POCT ACTIVATED CLOTTING TIME: ACTIVATED CLOTTING TIME: 246 s

## 2016-03-21 SURGERY — LEFT HEART CATH AND CORONARY ANGIOGRAPHY

## 2016-03-21 MED ORDER — IOPAMIDOL (ISOVUE-370) INJECTION 76%
INTRAVENOUS | Status: AC
  Filled 2016-03-21: qty 50

## 2016-03-21 MED ORDER — ASPIRIN 81 MG PO CHEW
81.0000 mg | CHEWABLE_TABLET | ORAL | Status: AC
  Administered 2016-03-21: 81 mg via ORAL

## 2016-03-21 MED ORDER — ADENOSINE (DIAGNOSTIC) 140MCG/KG/MIN
INTRAVENOUS | Status: DC | PRN
  Administered 2016-03-21: 140 ug/kg/min via INTRAVENOUS

## 2016-03-21 MED ORDER — SODIUM CHLORIDE 0.9% FLUSH: 3.0000 mL | Freq: Two times a day (BID) | INTRAVENOUS | Status: DC

## 2016-03-21 MED ORDER — SODIUM CHLORIDE 0.9 % WEIGHT BASED INFUSION: 3.0000 mL/kg/h | INTRAVENOUS | Status: AC

## 2016-03-21 MED ORDER — MIDAZOLAM HCL 2 MG/2ML IJ SOLN
INTRAMUSCULAR | Status: DC | PRN
  Administered 2016-03-21: 2 mg via INTRAVENOUS
  Administered 2016-03-21: 1 mg via INTRAVENOUS

## 2016-03-21 MED ORDER — HYDROMORPHONE HCL 1 MG/ML IJ SOLN
INTRAMUSCULAR | Status: DC | PRN
  Administered 2016-03-21: 0.5 mg via INTRAVENOUS

## 2016-03-21 MED ORDER — VERAPAMIL HCL 2.5 MG/ML IV SOLN
INTRAVENOUS | Status: AC
  Filled 2016-03-21: qty 2

## 2016-03-21 MED ORDER — IOPAMIDOL (ISOVUE-370) INJECTION 76%
INTRAVENOUS | Status: AC
  Filled 2016-03-21: qty 100

## 2016-03-21 MED ORDER — HEPARIN SODIUM (PORCINE) 1000 UNIT/ML IJ SOLN
INTRAMUSCULAR | Status: DC | PRN
  Administered 2016-03-21: 8000 [IU] via INTRAVENOUS
  Administered 2016-03-21: 1000 [IU] via INTRAVENOUS
  Administered 2016-03-21: 1500 [IU] via INTRAVENOUS

## 2016-03-21 MED ORDER — VERAPAMIL HCL 2.5 MG/ML IV SOLN
INTRA_ARTERIAL | Status: DC | PRN
  Administered 2016-03-21: 10 mL via INTRA_ARTERIAL

## 2016-03-21 MED ORDER — ADENOSINE 12 MG/4ML IV SOLN
INTRAVENOUS | Status: AC
  Filled 2016-03-21: qty 4

## 2016-03-21 MED ORDER — SODIUM CHLORIDE 0.9 % IV SOLN: 250.0000 mL | INTRAVENOUS | Status: DC | PRN

## 2016-03-21 MED ORDER — IOPAMIDOL (ISOVUE-370) INJECTION 76%
INTRAVENOUS | Status: DC | PRN
  Administered 2016-03-21: 110 mL via INTRAVENOUS

## 2016-03-21 MED ORDER — LIDOCAINE HCL (PF) 1 % IJ SOLN
INTRAMUSCULAR | Status: AC
  Filled 2016-03-21: qty 30

## 2016-03-21 MED ORDER — ASPIRIN 81 MG PO CHEW
CHEWABLE_TABLET | ORAL | Status: AC
  Filled 2016-03-21: qty 1

## 2016-03-21 MED ORDER — SODIUM CHLORIDE 0.9% FLUSH: 3.0000 mL | INTRAVENOUS | Status: DC | PRN

## 2016-03-21 MED ORDER — HEPARIN (PORCINE) IN NACL 2-0.9 UNIT/ML-% IJ SOLN
INTRAMUSCULAR | Status: AC
  Filled 2016-03-21: qty 1000

## 2016-03-21 MED ORDER — SODIUM CHLORIDE 0.9 % WEIGHT BASED INFUSION: 1.0000 mL/kg/h | INTRAVENOUS | Status: DC

## 2016-03-21 MED ORDER — HEPARIN SODIUM (PORCINE) 1000 UNIT/ML IJ SOLN
INTRAMUSCULAR | Status: AC
  Filled 2016-03-21: qty 1

## 2016-03-21 MED ORDER — NITROGLYCERIN 1 MG/10 ML FOR IR/CATH LAB
INTRA_ARTERIAL | Status: AC
  Filled 2016-03-21: qty 10

## 2016-03-21 MED ORDER — HYDROMORPHONE HCL 1 MG/ML IJ SOLN
INTRAMUSCULAR | Status: AC
  Filled 2016-03-21: qty 1

## 2016-03-21 MED ORDER — SODIUM CHLORIDE 0.9 % WEIGHT BASED INFUSION
3.0000 mL/kg/h | INTRAVENOUS | Status: DC
  Administered 2016-03-21: 3 mL/kg/h via INTRAVENOUS

## 2016-03-21 MED ORDER — HEPARIN (PORCINE) IN NACL 2-0.9 UNIT/ML-% IJ SOLN
INTRAMUSCULAR | Status: DC | PRN
  Administered 2016-03-21: 1000 mL

## 2016-03-21 MED ORDER — MIDAZOLAM HCL 2 MG/2ML IJ SOLN
INTRAMUSCULAR | Status: AC
  Filled 2016-03-21: qty 2

## 2016-03-21 MED ORDER — LIDOCAINE HCL (PF) 1 % IJ SOLN
INTRAMUSCULAR | Status: DC | PRN
  Administered 2016-03-21: 2 mL

## 2016-03-21 SURGICAL SUPPLY — 11 items
CATH OPTITORQUE TIG 4.0 5F (CATHETERS) ×1 IMPLANT
CATH VISTA GUIDE 6FR XBLAD3.5 (CATHETERS) ×1 IMPLANT
DEVICE RAD COMP TR BAND LRG (VASCULAR PRODUCTS) ×1 IMPLANT
GLIDESHEATH SLEND A-KIT 6F 20G (SHEATH) ×2 IMPLANT
GUIDEWIRE PRESSURE COMET II (WIRE) ×1 IMPLANT
KIT ESSENTIALS PG (KITS) ×1 IMPLANT
KIT HEART LEFT (KITS) ×2 IMPLANT
PACK CARDIAC CATHETERIZATION (CUSTOM PROCEDURE TRAY) ×2 IMPLANT
TRANSDUCER W/STOPCOCK (MISCELLANEOUS) ×2 IMPLANT
TUBING CIL FLEX 10 FLL-RA (TUBING) ×2 IMPLANT
WIRE SAFE-T 1.5MM-J .035X260CM (WIRE) ×1 IMPLANT

## 2016-03-21 NOTE — Interval H&P Note (Signed)
History and Physical Interval Note:  03/21/2016 7:46 AM  [name]  has presented today for surgery, with the diagnosis of angina  The various methods of treatment have been discussed with the patient and family. After consideration of risks, benefits and other options for treatment, the patient has consented to  Procedure(s): Left Heart Cath and Coronary Angiography (N/A) and possible PCI  as a surgical intervention .  The patient's history has been reviewed, patient examined, no change in status, stable for surgery.  I have reviewed the patient's chart and labs.  Questions were answered to the patient's satisfaction.    Ischemic Symptoms? CCS III (Marked limitation of ordinary activity) Anti-ischemic Medical Therapy? Minimal Therapy (1 class of medications) Non-invasive Test Results? No non-invasive testing performed Prior CABG? No Previous CABG   Patient Information:   1-2V CAD, no prox LAD  A (7)  Indication: 20; Score: 7   Patient Information:   1-2V-CAD with DS 50-60% With No FFR, No IVUS  I (3)  Indication: 21; Score: 3   Patient Information:   1-2V-CAD with DS 50-60% With FFR  A (7)  Indication: 22; Score: 7   Patient Information:   1-2V-CAD with DS 50-60% With FFR>0.8, IVUS not significant  I (2)  Indication: 23; Score: 2   Patient Information:   3V-CAD without LMCA With Abnormal LV systolic function  A (9)  Indication: 48; Score: 9   Patient Information:   LMCA-CAD  A (9)  Indication: 49; Score: 9   Patient Information:   2V-CAD with prox LAD PCI  A (7)  Indication: 62; Score: 7   Patient Information:   2V-CAD with prox LAD CABG  A (8)  Indication: 62; Score: 8   Patient Information:   3V-CAD without LMCA With Low CAD burden(i.e., 3 focal stenoses, low SYNTAX score) PCI  A (7)  Indication: 63; Score: 7   Patient Information:   3V-CAD without LMCA With Low CAD burden(i.e., 3 focal stenoses, low SYNTAX  score) CABG  A (9)  Indication: 63; Score: 9   Patient Information:   3V-CAD without LMCA E06c - Intermediate-high CAD burden (i.e., multiple diffuse lesions, presence of CTO, or high SYNTAX score) PCI  U (4)  Indication: 64; Score: 4   Patient Information:   3V-CAD without LMCA E06c - Intermediate-high CAD burden (i.e., multiple diffuse lesions, presence of CTO, or high SYNTAX score) CABG  A (9)  Indication: 64; Score: 9   Patient Information:   LMCA-CAD With Isolated LMCA stenosis  PCI  U (6)  Indication: 65; Score: 6   Patient Information:   LMCA-CAD With Isolated LMCA stenosis  CABG  A (9)  Indication: 65; Score: 9   Patient Information:   LMCA-CAD Additional CAD, low CAD burden (i.e., 1- to 2-vessel additional involvement, low SYNTAX score) PCI  U (5)  Indication: 66; Score: 5   Patient Information:   LMCA-CAD Additional CAD, low CAD burden (i.e., 1- to 2-vessel additional involvement, low SYNTAX score) CABG  A (9)  Indication: 66; Score: 9   Patient Information:   LMCA-CAD Additional CAD, intermediate-high CAD burden (i.e., 3-vessel involvement, presence of CTO, or high SYNTAX score) PCI  I (3)  Indication: 67; Score: 3   Patient Information:   LMCA-CAD Additional CAD, intermediate-high CAD burden (i.e., 3-vessel involvement, presence of CTO, or high SYNTAX score) CABG  A (9)  Indication: 67; Score: 9  Randall Moore

## 2016-03-21 NOTE — Discharge Instructions (Signed)

## 2016-03-24 ENCOUNTER — Encounter (HOSPITAL_COMMUNITY): Payer: Self-pay | Admitting: Cardiology

## 2016-07-01 ENCOUNTER — Encounter (INDEPENDENT_AMBULATORY_CARE_PROVIDER_SITE_OTHER): Payer: Self-pay

## 2016-07-01 ENCOUNTER — Ambulatory Visit (INDEPENDENT_AMBULATORY_CARE_PROVIDER_SITE_OTHER): Payer: 59 | Admitting: Physician Assistant

## 2016-07-01 ENCOUNTER — Encounter: Payer: Self-pay | Admitting: Physician Assistant

## 2016-07-01 VITALS — BP 130/72 | HR 64 | Ht 71.0 in | Wt 179.0 lb

## 2016-07-01 DIAGNOSIS — Z1211 Encounter for screening for malignant neoplasm of colon: Secondary | ICD-10-CM | POA: Diagnosis not present

## 2016-07-01 DIAGNOSIS — Z1212 Encounter for screening for malignant neoplasm of rectum: Secondary | ICD-10-CM

## 2016-07-01 DIAGNOSIS — R1032 Left lower quadrant pain: Secondary | ICD-10-CM | POA: Diagnosis not present

## 2016-07-01 MED ORDER — CIPROFLOXACIN HCL 500 MG PO TABS: 500.0000 mg | ORAL_TABLET | Freq: Two times a day (BID) | ORAL | 0 refills | Status: DC

## 2016-07-01 MED ORDER — METRONIDAZOLE 500 MG PO TABS: 500.0000 mg | ORAL_TABLET | Freq: Three times a day (TID) | ORAL | 0 refills | Status: DC

## 2016-07-01 MED ORDER — NA SULFATE-K SULFATE-MG SULF 17.5-3.13-1.6 GM/177ML PO SOLN: 1.0000 | ORAL | 0 refills | Status: DC

## 2016-07-01 NOTE — Progress Notes (Signed)
Agree with initial assessment and plans. Recommend CT if problem persists or worsens despite therapy

## 2016-07-01 NOTE — Patient Instructions (Addendum)
You have been scheduled for a colonoscopy. Please follow written instructions given to you at your visit today.  Please pick up your prep supplies at the pharmacy within the next 1-3 days. If you use inhalers (even only as needed), please bring them with you on the day of your procedure.  We have sent the following medications to your pharmacy for you to pick up at your convenience: Cipro 500 mg twice a day for 10 days Flagyl 500 mg three times a day for 10 days
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
## 2016-09-09 ENCOUNTER — Encounter: Payer: Self-pay | Admitting: Internal Medicine

## 2016-09-09 ENCOUNTER — Ambulatory Visit (AMBULATORY_SURGERY_CENTER): Payer: 59 | Admitting: Internal Medicine

## 2016-09-09 VITALS — BP 100/49 | HR 55 | Temp 98.0°F | Resp 16 | Ht 71.0 in | Wt 179.0 lb

## 2016-09-09 DIAGNOSIS — D125 Benign neoplasm of sigmoid colon: Secondary | ICD-10-CM | POA: Diagnosis not present

## 2016-09-09 DIAGNOSIS — Z1212 Encounter for screening for malignant neoplasm of rectum: Secondary | ICD-10-CM | POA: Diagnosis not present

## 2016-09-09 DIAGNOSIS — D123 Benign neoplasm of transverse colon: Secondary | ICD-10-CM

## 2016-09-09 DIAGNOSIS — K635 Polyp of colon: Secondary | ICD-10-CM

## 2016-09-09 DIAGNOSIS — Z1211 Encounter for screening for malignant neoplasm of colon: Secondary | ICD-10-CM

## 2016-09-09 MED ORDER — SODIUM CHLORIDE 0.9 % IV SOLN: 500.0000 mL | INTRAVENOUS | Status: DC

## 2016-09-09 NOTE — Patient Instructions (Signed)
HANDOUT GIVEN : POLYPS   YOU HAD AN ENDOSCOPIC PROCEDURE TODAY AT THE  ENDOSCOPY CENTER:   Refer to the procedure report that was given to you for any specific questions about what was found during the examination.  If the procedure report does not answer your questions, please call your gastroenterologist to clarify.  If you requested that your care partner not be given the details of your procedure findings, then the procedure report has been included in a sealed envelope for you to review at your convenience later.  YOU SHOULD EXPECT: Some feelings of bloating in the abdomen. Passage of more gas than usual.  Walking can help get rid of the air that was put into your GI tract during the procedure and reduce the bloating. If you had a lower endoscopy (such as a colonoscopy or flexible sigmoidoscopy) you may notice spotting of blood in your stool or on the toilet paper. If you underwent a bowel prep for your procedure, you may not have a normal bowel movement for a few days.  Please Note:  You might notice some irritation and congestion in your nose or some drainage.  This is from the oxygen used during your procedure.  There is no need for concern and it should clear up in a day or so.  SYMPTOMS TO REPORT IMMEDIATELY:   Following lower endoscopy (colonoscopy or flexible sigmoidoscopy):  Excessive amounts of blood in the stool  Significant tenderness or worsening of abdominal pains  Swelling of the abdomen that is new, acute  Fever of 100F or higher   For urgent or emergent issues, a gastroenterologist can be reached at any hour by calling [phone].   DIET:  We do recommend a small meal at first, but then you may proceed to your regular diet.  Drink plenty of fluids but you should avoid alcoholic beverages for 24 hours.  ACTIVITY:  You should plan to take it easy for the rest of today and you should NOT DRIVE or use heavy machinery until tomorrow (because of the sedation  medicines used during the test).    FOLLOW UP: Our staff will call the number listed on your records the next business day following your procedure to check on you and address any questions or concerns that you may have regarding the information given to you following your procedure. If we do not reach you, we will leave a message.  However, if you are feeling well and you are not experiencing any problems, there is no need to return our call.  We will assume that you have returned to your regular daily activities without incident.  If any biopsies were taken you will be contacted by phone or by letter within the next 1-3 weeks.  Please call us at [phone] if you have not heard about the biopsies in 3 weeks.    SIGNATURES/CONFIDENTIALITY: You and/or your care partner have signed paperwork which will be entered into your electronic medical record.  These signatures attest to the fact that that the information above on your After Visit Summary has been reviewed and is understood.  Full responsibility of the confidentiality of this discharge information lies with you and/or your care-partner. 

## 2016-09-09 NOTE — Progress Notes (Signed)
Pt's states no medical or surgical changes since previsit or office visit.  Called to room to assist during endoscopic procedure.  Patient ID and intended procedure confirmed with present staff. Received instructions for my participation in the procedure from the performing physician.   

## 2016-09-09 NOTE — Op Note (Signed)
[facility] Patient Name: Randall Moore Procedure Date: 09/09/2016 1:16 PM MRN: [id number] Endoscopist: Docia Chuck. Henrene Pastor , MD Age: 71 Referring MD:  Date of Birth: 07/24/1946 Gender: Male Account #: [id number] Procedure:                Colonoscopy, with cold snare polypectomy X2 Indications:              High risk colon cancer surveillance: Personal                            history of non-advanced adenoma. Index examination                            2004 with tubular adenoma. Follow-up 2007 with fair                            prep. Last exam 2011 negative for neoplasia Medicines:                Monitored Anesthesia Care Procedure:                Pre-Anesthesia Assessment:                           - Prior to the procedure, a History and Physical                            was performed, and patient medications and                            allergies were reviewed. The patient's tolerance of                            previous anesthesia was also reviewed. The risks                            and benefits of the procedure and the sedation                            options and risks were discussed with the patient.                            All questions were answered, and informed consent                            was obtained. Prior Anticoagulants: The patient has                            taken no previous anticoagulant or antiplatelet                            agents. ASA Grade Assessment: II - A patient with                            mild systemic disease. After reviewing the risks  and benefits, the patient was deemed in                            satisfactory condition to undergo the procedure.                           After obtaining informed consent, the colonoscope                            was passed under direct vision. Throughout the                            procedure, the patient's blood pressure, pulse, and                             oxygen saturations were monitored continuously. The                            Model CF-HQ190L [id number]) scope was introduced                            through the anus and advanced to the the cecum,                            identified by appendiceal orifice and ileocecal                            valve. The ileocecal valve, appendiceal orifice,                            and rectum were photographed. The quality of the                            bowel preparation was good. The colonoscopy was                            performed without difficulty. The patient tolerated                            the procedure well. The bowel preparation used was                            SUPREP.                           RECTAL EXAM. Unremarkable including prostate Scope In: 1:21:17 PM Scope Out: 1:48:23 PM Scope Withdrawal Time: 0 hours 17 minutes 23 seconds  Total Procedure Duration: 0 hours 27 minutes 6 seconds  Findings:                 Two polyps were found in the sigmoid colon and                            transverse colon. The polyps were 3 to 6 mm in  size. These polyps were removed with a cold snare.                            Resection and retrieval were complete.                           The exam was otherwise without abnormality on                            direct and retroflexion views. Complications:            No immediate complications. Estimated blood loss:                            None. Estimated Blood Loss:     Estimated blood loss: none. Impression:               - Two 3 to 6 mm polyps in the sigmoid colon and in                            the transverse colon, removed with a cold snare.                            Resected and retrieved.                           - The examination was otherwise normal on direct                            and retroflexion views. Recommendation:           - Repeat colonoscopy in 5 years for  surveillance.                           - Patient has a contact number available for                            emergencies. The signs and symptoms of potential                            delayed complications were discussed with the                            patient. Return to normal activities tomorrow.                            Written discharge instructions were provided to the                            patient.                           - Resume previous diet.                           - Continue present medications.                           -  Await pathology results. Docia Chuck. Henrene Pastor, MD 09/09/2016 1:53:19 PM This report has been signed electronically.

## 2016-09-09 NOTE — Progress Notes (Signed)
Report to PACU, RN, vss, BBS= Clear.  

## 2016-09-10 ENCOUNTER — Telehealth: Payer: Self-pay

## 2016-09-10 NOTE — Telephone Encounter (Signed)
  Follow up Call-  Call back number 09/09/2016  Post procedure Call Back phone  # [phone]  Permission to leave phone message Yes  Some recent data might be hidden     Patient questions:  Do you have a fever, pain , or abdominal swelling? No. Pain Score  0 *  Have you tolerated food without any problems? Yes.    Have you been able to return to your normal activities? Yes.    Do you have any questions about your discharge instructions: Diet   No. Medications  No. Follow up visit  No.  Do you have questions or concerns about your Care? No.  Actions: * If pain score is 4 or above: No action needed, pain <4.

## 2016-09-18 ENCOUNTER — Encounter: Payer: Self-pay | Admitting: Internal Medicine

## 2016-12-15 ENCOUNTER — Emergency Department (HOSPITAL_COMMUNITY): Payer: 59

## 2016-12-15 ENCOUNTER — Encounter (HOSPITAL_COMMUNITY): Payer: Self-pay | Admitting: Radiology

## 2016-12-15 ENCOUNTER — Observation Stay (HOSPITAL_COMMUNITY)
Admission: EM | Admit: 2016-12-15 | Discharge: 2016-12-17 | Disposition: A | Discharge status: Home / Self Care | Payer: 59 | Attending: Internal Medicine | Admitting: Internal Medicine

## 2016-12-15 DIAGNOSIS — I493 Ventricular premature depolarization: Secondary | ICD-10-CM | POA: Diagnosis not present

## 2016-12-15 DIAGNOSIS — Z91018 Allergy to other foods: Secondary | ICD-10-CM | POA: Insufficient documentation

## 2016-12-15 DIAGNOSIS — Z808 Family history of malignant neoplasm of other organs or systems: Secondary | ICD-10-CM | POA: Insufficient documentation

## 2016-12-15 DIAGNOSIS — Z7982 Long term (current) use of aspirin: Secondary | ICD-10-CM | POA: Insufficient documentation

## 2016-12-15 DIAGNOSIS — K219 Gastro-esophageal reflux disease without esophagitis: Secondary | ICD-10-CM | POA: Insufficient documentation

## 2016-12-15 DIAGNOSIS — K8000 Calculus of gallbladder with acute cholecystitis without obstruction: Principal | ICD-10-CM | POA: Insufficient documentation

## 2016-12-15 DIAGNOSIS — R109 Unspecified abdominal pain: Secondary | ICD-10-CM | POA: Diagnosis present

## 2016-12-15 DIAGNOSIS — I2510 Atherosclerotic heart disease of native coronary artery without angina pectoris: Secondary | ICD-10-CM | POA: Diagnosis not present

## 2016-12-15 DIAGNOSIS — E785 Hyperlipidemia, unspecified: Secondary | ICD-10-CM | POA: Diagnosis present

## 2016-12-15 DIAGNOSIS — Z79899 Other long term (current) drug therapy: Secondary | ICD-10-CM | POA: Insufficient documentation

## 2016-12-15 DIAGNOSIS — Z87891 Personal history of nicotine dependence: Secondary | ICD-10-CM | POA: Diagnosis not present

## 2016-12-15 DIAGNOSIS — Z886 Allergy status to analgesic agent status: Secondary | ICD-10-CM | POA: Diagnosis not present

## 2016-12-15 DIAGNOSIS — Z880 Allergy status to penicillin: Secondary | ICD-10-CM | POA: Diagnosis not present

## 2016-12-15 DIAGNOSIS — K635 Polyp of colon: Secondary | ICD-10-CM | POA: Insufficient documentation

## 2016-12-15 DIAGNOSIS — K8020 Calculus of gallbladder without cholecystitis without obstruction: Secondary | ICD-10-CM | POA: Diagnosis present

## 2016-12-15 DIAGNOSIS — I10 Essential (primary) hypertension: Secondary | ICD-10-CM | POA: Insufficient documentation

## 2016-12-15 DIAGNOSIS — N442 Benign cyst of testis: Secondary | ICD-10-CM | POA: Diagnosis not present

## 2016-12-15 DIAGNOSIS — J309 Allergic rhinitis, unspecified: Secondary | ICD-10-CM | POA: Diagnosis not present

## 2016-12-15 DIAGNOSIS — Z8249 Family history of ischemic heart disease and other diseases of the circulatory system: Secondary | ICD-10-CM | POA: Diagnosis not present

## 2016-12-15 DIAGNOSIS — Z91010 Allergy to peanuts: Secondary | ICD-10-CM | POA: Insufficient documentation

## 2016-12-15 DIAGNOSIS — Z87442 Personal history of urinary calculi: Secondary | ICD-10-CM | POA: Diagnosis not present

## 2016-12-15 DIAGNOSIS — K810 Acute cholecystitis: Secondary | ICD-10-CM

## 2016-12-15 LAB — COMPREHENSIVE METABOLIC PANEL
ALBUMIN: 4.7 g/dL (ref 3.5–5.0)
ALK PHOS: 73 U/L (ref 38–126)
ALT: 34 U/L (ref 17–63)
ANION GAP: 8 (ref 5–15)
AST: 33 U/L (ref 15–41)
BILIRUBIN TOTAL: 0.9 mg/dL (ref 0.3–1.2)
BUN: 19 mg/dL (ref 6–20)
CALCIUM: 9.5 mg/dL (ref 8.9–10.3)
CO2: 25 mmol/L (ref 22–32)
Chloride: 106 mmol/L (ref 101–111)
Creatinine, Ser: 1.07 mg/dL (ref 0.61–1.24)
GFR calc Af Amer: >60 mL/min (ref >60)
GFR calc non Af Amer: >60 mL/min (ref >60)
GLUCOSE: 121 mg/dL — AB (ref 65–99)
Potassium: 4.4 mmol/L (ref 3.5–5.1)
Sodium: 139 mmol/L (ref 135–145)
TOTAL PROTEIN: 7.2 g/dL (ref 6.5–8.1)

## 2016-12-15 LAB — CBC WITH DIFFERENTIAL/PLATELET
BASOS ABS: 0 10*3/uL (ref 0.0–0.1)
BASOS PCT: 1 %
EOS ABS: 0.2 10*3/uL (ref 0.0–0.7)
EOS PCT: 3 %
HCT: 45.7 % (ref 39.0–52.0)
Hemoglobin: 16.9 g/dL (ref 13.0–17.0)
Lymphocytes Relative: 21 %
Lymphs Abs: 1.4 10*3/uL (ref 0.7–4.0)
MCH: 33.8 pg (ref 26.0–34.0)
MCHC: 37 g/dL — AB (ref 30.0–36.0)
MCV: 91.4 fL (ref 78.0–100.0)
MONO ABS: 0.3 10*3/uL (ref 0.1–1.0)
Monocytes Relative: 4 %
NEUTROS ABS: 4.6 10*3/uL (ref 1.7–7.7)
Neutrophils Relative %: 72 %
Platelets: 145 10*3/uL — ABNORMAL LOW (ref 150–400)
RBC: 5 MIL/uL (ref 4.22–5.81)
RDW: 12.3 % (ref 11.5–15.5)
WBC: 6.5 10*3/uL (ref 4.0–10.5)

## 2016-12-15 LAB — I-STAT CHEM 8, ED
BUN: 21 mg/dL — AB (ref 6–20)
CALCIUM ION: 1.14 mmol/L — AB (ref 1.15–1.40)
CHLORIDE: 105 mmol/L (ref 101–111)
Creatinine, Ser: 1 mg/dL (ref 0.61–1.24)
Glucose, Bld: 119 mg/dL — ABNORMAL HIGH (ref 65–99)
HCT: 46 % (ref 39.0–52.0)
Hemoglobin: 15.6 g/dL (ref 13.0–17.0)
Potassium: 4.4 mmol/L (ref 3.5–5.1)
SODIUM: 140 mmol/L (ref 135–145)
TCO2: 28 mmol/L (ref 0–100)

## 2016-12-15 LAB — PROTIME-INR
INR: 0.97
PROTHROMBIN TIME: 12.9 s (ref 11.4–15.2)

## 2016-12-15 LAB — SURGICAL PCR SCREEN
MRSA, PCR: NEGATIVE
Staphylococcus aureus: NEGATIVE

## 2016-12-15 LAB — I-STAT TROPONIN, ED
Troponin i, poc: 0 ng/mL (ref 0.00–0.08)
Troponin i, poc: 0 ng/mL (ref 0.00–0.08)

## 2016-12-15 LAB — LIPASE, BLOOD: LIPASE: 23 U/L (ref 11–51)

## 2016-12-15 LAB — APTT: aPTT: 28 seconds (ref 24–36)

## 2016-12-15 MED ORDER — BISACODYL 5 MG PO TBEC
5.0000 mg | DELAYED_RELEASE_TABLET | Freq: Every day | ORAL | Status: DC | PRN
  Filled 2016-12-15: qty 1

## 2016-12-15 MED ORDER — ONDANSETRON HCL 4 MG/2ML IJ SOLN
4.0000 mg | Freq: Four times a day (QID) | INTRAMUSCULAR | Status: DC | PRN
  Administered 2016-12-15 – 2016-12-16 (×2): 4 mg via INTRAVENOUS
  Filled 2016-12-15: qty 2

## 2016-12-15 MED ORDER — CIPROFLOXACIN IN D5W 400 MG/200ML IV SOLN
400.0000 mg | Freq: Two times a day (BID) | INTRAVENOUS | Status: DC
  Administered 2016-12-15 – 2016-12-17 (×4): 400 mg via INTRAVENOUS
  Filled 2016-12-15 (×5): qty 200

## 2016-12-15 MED ORDER — PANTOPRAZOLE SODIUM 40 MG IV SOLR
40.0000 mg | Freq: Once | INTRAVENOUS | Status: AC
  Administered 2016-12-15: 40 mg via INTRAVENOUS
  Filled 2016-12-15: qty 40

## 2016-12-15 MED ORDER — PROCHLORPERAZINE EDISYLATE 5 MG/ML IJ SOLN
5.0000 mg | INTRAMUSCULAR | Status: DC | PRN
  Administered 2016-12-16: 5 mg via INTRAVENOUS
  Filled 2016-12-15: qty 2

## 2016-12-15 MED ORDER — ATORVASTATIN CALCIUM 40 MG PO TABS
80.0000 mg | ORAL_TABLET | Freq: Every evening | ORAL | Status: DC
  Administered 2016-12-15 – 2016-12-16 (×2): 80 mg via ORAL
  Filled 2016-12-15 (×2): qty 2

## 2016-12-15 MED ORDER — AMLODIPINE BESYLATE 5 MG PO TABS
5.0000 mg | ORAL_TABLET | Freq: Every day | ORAL | Status: DC
  Administered 2016-12-16: 5 mg via ORAL
  Filled 2016-12-15: qty 1

## 2016-12-15 MED ORDER — IOPAMIDOL (ISOVUE-370) INJECTION 76%
INTRAVENOUS | Status: AC
  Administered 2016-12-15: 100 mL
  Filled 2016-12-15: qty 100

## 2016-12-15 MED ORDER — HYDROMORPHONE HCL 1 MG/ML IJ SOLN
1.0000 mg | INTRAMUSCULAR | Status: DC | PRN
  Administered 2016-12-15 – 2016-12-16 (×3): 1 mg via INTRAVENOUS
  Filled 2016-12-15 (×4): qty 1

## 2016-12-15 MED ORDER — LORATADINE 10 MG PO TABS
5.0000 mg | ORAL_TABLET | Freq: Every day | ORAL | Status: DC
  Administered 2016-12-15 – 2016-12-16 (×2): 5 mg via ORAL
  Filled 2016-12-15 (×2): qty 1

## 2016-12-15 MED ORDER — SODIUM CHLORIDE 0.9 % IV SOLN
INTRAVENOUS | Status: DC
  Administered 2016-12-15 – 2016-12-16 (×3): via INTRAVENOUS

## 2016-12-15 MED ORDER — CARVEDILOL 3.125 MG PO TABS
3.1250 mg | ORAL_TABLET | Freq: Every day | ORAL | Status: DC
  Administered 2016-12-16 – 2016-12-17 (×2): 3.125 mg via ORAL
  Filled 2016-12-15 (×2): qty 1

## 2016-12-15 MED ORDER — HYDROCODONE-ACETAMINOPHEN 5-325 MG PO TABS
1.0000 | ORAL_TABLET | ORAL | Status: DC | PRN
  Administered 2016-12-15 – 2016-12-17 (×4): 1 via ORAL
  Filled 2016-12-15 (×4): qty 1

## 2016-12-15 MED ORDER — HYDROMORPHONE HCL 1 MG/ML IJ SOLN
1.0000 mg | Freq: Once | INTRAMUSCULAR | Status: AC
  Administered 2016-12-15: 1 mg via INTRAVENOUS
  Filled 2016-12-15: qty 1

## 2016-12-15 MED ORDER — LACTATED RINGERS IV BOLUS (SEPSIS): 1000.0000 mL | Freq: Once | INTRAVENOUS | Status: DC
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
## 2016-12-15 NOTE — ED Triage Notes (Signed)
Pt reports epigastric pain radiating to mid back. Hx HTN BP 184/100. sts took aspirin and antiacid meds with no relief. Pt appears in distress. Alert and oriented  x4.

## 2016-12-15 NOTE — ED Notes (Signed)
Patient transported to X-ray 

## 2016-12-16 ENCOUNTER — Observation Stay (HOSPITAL_COMMUNITY): Payer: 59 | Admitting: Anesthesiology

## 2016-12-16 ENCOUNTER — Encounter (HOSPITAL_COMMUNITY)
Admission: EM | Disposition: A | Discharge status: Home / Self Care | Payer: Self-pay | Source: Home / Self Care | Attending: Emergency Medicine

## 2016-12-16 ENCOUNTER — Observation Stay (HOSPITAL_COMMUNITY): Payer: 59

## 2016-12-16 ENCOUNTER — Encounter (HOSPITAL_COMMUNITY): Payer: Self-pay | Admitting: Anesthesiology

## 2016-12-16 DIAGNOSIS — I2510 Atherosclerotic heart disease of native coronary artery without angina pectoris: Secondary | ICD-10-CM | POA: Diagnosis not present

## 2016-12-16 DIAGNOSIS — J309 Allergic rhinitis, unspecified: Secondary | ICD-10-CM | POA: Diagnosis not present

## 2016-12-16 DIAGNOSIS — R109 Unspecified abdominal pain: Secondary | ICD-10-CM | POA: Diagnosis not present

## 2016-12-16 DIAGNOSIS — K8020 Calculus of gallbladder without cholecystitis without obstruction: Secondary | ICD-10-CM | POA: Diagnosis not present

## 2016-12-16 DIAGNOSIS — E785 Hyperlipidemia, unspecified: Secondary | ICD-10-CM | POA: Diagnosis not present

## 2016-12-16 HISTORY — PX: CHOLECYSTECTOMY: SHX55

## 2016-12-16 LAB — LIPASE, BLOOD: LIPASE: 16 U/L (ref 11–51)

## 2016-12-16 LAB — COMPREHENSIVE METABOLIC PANEL
ALBUMIN: 4 g/dL (ref 3.5–5.0)
ALK PHOS: 64 U/L (ref 38–126)
ALT: 26 U/L (ref 17–63)
ANION GAP: 8 (ref 5–15)
AST: 21 U/L (ref 15–41)
BILIRUBIN TOTAL: 1.5 mg/dL — AB (ref 0.3–1.2)
BUN: 17 mg/dL (ref 6–20)
CALCIUM: 8.6 mg/dL — AB (ref 8.9–10.3)
CO2: 28 mmol/L (ref 22–32)
Chloride: 102 mmol/L (ref 101–111)
Creatinine, Ser: 1.05 mg/dL (ref 0.61–1.24)
GFR calc non Af Amer: >60 mL/min (ref >60)
Glucose, Bld: 116 mg/dL — ABNORMAL HIGH (ref 65–99)
POTASSIUM: 4 mmol/L (ref 3.5–5.1)
SODIUM: 138 mmol/L (ref 135–145)
TOTAL PROTEIN: 6.6 g/dL (ref 6.5–8.1)

## 2016-12-16 LAB — CBC
HEMATOCRIT: 42.3 % (ref 39.0–52.0)
HEMOGLOBIN: 15.3 g/dL (ref 13.0–17.0)
MCH: 33.8 pg (ref 26.0–34.0)
MCHC: 36.2 g/dL — AB (ref 30.0–36.0)
MCV: 93.6 fL (ref 78.0–100.0)
PLATELETS: 141 10*3/uL — AB (ref 150–400)
RBC: 4.52 MIL/uL (ref 4.22–5.81)
RDW: 12.7 % (ref 11.5–15.5)
WBC: 10.7 10*3/uL — ABNORMAL HIGH (ref 4.0–10.5)

## 2016-12-16 SURGERY — LAPAROSCOPIC CHOLECYSTECTOMY WITH INTRAOPERATIVE CHOLANGIOGRAM
Anesthesia: General

## 2016-12-16 MED ORDER — SUGAMMADEX SODIUM 200 MG/2ML IV SOLN
INTRAVENOUS | Status: AC
  Filled 2016-12-16: qty 2

## 2016-12-16 MED ORDER — EPHEDRINE SULFATE-NACL 50-0.9 MG/10ML-% IV SOSY
PREFILLED_SYRINGE | INTRAVENOUS | Status: DC | PRN
  Administered 2016-12-16: 5 mg via INTRAVENOUS

## 2016-12-16 MED ORDER — PROMETHAZINE HCL 25 MG/ML IJ SOLN: 6.2500 mg | INTRAMUSCULAR | Status: DC | PRN

## 2016-12-16 MED ORDER — MIDAZOLAM HCL 5 MG/5ML IJ SOLN
INTRAMUSCULAR | Status: DC | PRN
  Administered 2016-12-16: 2 mg via INTRAVENOUS

## 2016-12-16 MED ORDER — DEXAMETHASONE SODIUM PHOSPHATE 10 MG/ML IJ SOLN
INTRAMUSCULAR | Status: AC
  Filled 2016-12-16: qty 1

## 2016-12-16 MED ORDER — LACTATED RINGERS IR SOLN
Status: DC | PRN
  Administered 2016-12-16: 1000 mL

## 2016-12-16 MED ORDER — LACTATED RINGERS IV SOLN
INTRAVENOUS | Status: DC
  Administered 2016-12-16 (×3): via INTRAVENOUS

## 2016-12-16 MED ORDER — IOPAMIDOL (ISOVUE-300) INJECTION 61%
INTRAVENOUS | Status: AC
  Filled 2016-12-16: qty 50

## 2016-12-16 MED ORDER — FENTANYL CITRATE (PF) 100 MCG/2ML IJ SOLN
INTRAMUSCULAR | Status: DC | PRN
  Administered 2016-12-16 (×2): 50 ug via INTRAVENOUS
  Administered 2016-12-16: 100 ug via INTRAVENOUS
  Administered 2016-12-16: 50 ug via INTRAVENOUS

## 2016-12-16 MED ORDER — HYDROMORPHONE HCL 1 MG/ML IJ SOLN
INTRAMUSCULAR | Status: AC
  Filled 2016-12-16: qty 0.5

## 2016-12-16 MED ORDER — SUCCINYLCHOLINE CHLORIDE 200 MG/10ML IV SOSY
PREFILLED_SYRINGE | INTRAVENOUS | Status: DC | PRN
  Administered 2016-12-16: 100 mg via INTRAVENOUS

## 2016-12-16 MED ORDER — BUPIVACAINE HCL (PF) 0.25 % IJ SOLN
INTRAMUSCULAR | Status: DC | PRN
  Administered 2016-12-16: 20 mL

## 2016-12-16 MED ORDER — SUCCINYLCHOLINE CHLORIDE 200 MG/10ML IV SOSY
PREFILLED_SYRINGE | INTRAVENOUS | Status: AC
  Filled 2016-12-16: qty 10

## 2016-12-16 MED ORDER — 0.9 % SODIUM CHLORIDE (POUR BTL) OPTIME
TOPICAL | Status: DC | PRN
  Administered 2016-12-16: 1000 mL

## 2016-12-16 MED ORDER — BUPIVACAINE HCL (PF) 0.25 % IJ SOLN
INTRAMUSCULAR | Status: AC
  Filled 2016-12-16: qty 30

## 2016-12-16 MED ORDER — FENTANYL CITRATE (PF) 250 MCG/5ML IJ SOLN
INTRAMUSCULAR | Status: AC
  Filled 2016-12-16: qty 5

## 2016-12-16 MED ORDER — HYDROMORPHONE HCL 1 MG/ML IJ SOLN
0.2500 mg | INTRAMUSCULAR | Status: DC | PRN
  Administered 2016-12-16: 0.5 mg via INTRAVENOUS

## 2016-12-16 MED ORDER — MIDAZOLAM HCL 2 MG/2ML IJ SOLN
INTRAMUSCULAR | Status: AC
  Filled 2016-12-16: qty 2

## 2016-12-16 MED ORDER — ROCURONIUM BROMIDE 10 MG/ML (PF) SYRINGE
PREFILLED_SYRINGE | INTRAVENOUS | Status: DC | PRN
  Administered 2016-12-16: 10 mg via INTRAVENOUS
  Administered 2016-12-16: 40 mg via INTRAVENOUS

## 2016-12-16 MED ORDER — LIDOCAINE 2% (20 MG/ML) 5 ML SYRINGE
INTRAMUSCULAR | Status: DC | PRN
  Administered 2016-12-16: 100 mg via INTRAVENOUS

## 2016-12-16 MED ORDER — PROPOFOL 10 MG/ML IV BOLUS
INTRAVENOUS | Status: AC
  Filled 2016-12-16: qty 20

## 2016-12-16 MED ORDER — ONDANSETRON HCL 4 MG/2ML IJ SOLN
INTRAMUSCULAR | Status: AC
  Filled 2016-12-16: qty 2

## 2016-12-16 MED ORDER — IOPAMIDOL (ISOVUE-300) INJECTION 61%
INTRAVENOUS | Status: DC | PRN
  Administered 2016-12-16: 5 mL

## 2016-12-16 MED ORDER — EPHEDRINE 5 MG/ML INJ
INTRAVENOUS | Status: AC
  Filled 2016-12-16: qty 10

## 2016-12-16 MED ORDER — LORATADINE 10 MG PO TABS
10.0000 mg | ORAL_TABLET | Freq: Every day | ORAL | Status: DC
  Administered 2016-12-16 – 2016-12-17 (×2): 10 mg via ORAL
  Filled 2016-12-16 (×2): qty 1

## 2016-12-16 MED ORDER — SUGAMMADEX SODIUM 200 MG/2ML IV SOLN
INTRAVENOUS | Status: DC | PRN
  Administered 2016-12-16: 200 mg via INTRAVENOUS

## 2016-12-16 MED ORDER — PROPOFOL 10 MG/ML IV BOLUS
INTRAVENOUS | Status: DC | PRN
  Administered 2016-12-16: 150 mg via INTRAVENOUS

## 2016-12-16 MED ORDER — LIDOCAINE 2% (20 MG/ML) 5 ML SYRINGE
INTRAMUSCULAR | Status: AC
  Filled 2016-12-16: qty 5

## 2016-12-16 MED ORDER — KETAMINE HCL 10 MG/ML IJ SOLN
INTRAMUSCULAR | Status: DC | PRN
  Administered 2016-12-16: 10 mg via INTRAVENOUS
  Administered 2016-12-16: 20 mg via INTRAVENOUS

## 2016-12-16 MED ORDER — ROCURONIUM BROMIDE 50 MG/5ML IV SOSY
PREFILLED_SYRINGE | INTRAVENOUS | Status: AC
  Filled 2016-12-16: qty 5

## 2016-12-16 MED ORDER — DEXAMETHASONE SODIUM PHOSPHATE 10 MG/ML IJ SOLN
INTRAMUSCULAR | Status: DC | PRN
  Administered 2016-12-16: 10 mg via INTRAVENOUS

## 2016-12-16 SURGICAL SUPPLY — 43 items
APL SKNCLS STERI-STRIP NONHPOA (GAUZE/BANDAGES/DRESSINGS) ×1
APPLIER CLIP ROT 10 11.4 M/L (STAPLE)
APR CLP MED LRG 11.4X10 (STAPLE)
BAG SPEC RTRVL 10 TROC 200 (ENDOMECHANICALS) ×1
BANDAGE ADH SHEER 1  50/CT (GAUZE/BANDAGES/DRESSINGS) ×2 IMPLANT
BENZOIN TINCTURE PRP APPL 2/3 (GAUZE/BANDAGES/DRESSINGS) ×1 IMPLANT
CABLE HIGH FREQUENCY MONO STRZ (ELECTRODE) ×2 IMPLANT
CATH CHOLANG 76X19 KUMAR (CATHETERS) ×2 IMPLANT
CHLORAPREP W/TINT 26ML (MISCELLANEOUS) ×2 IMPLANT
CLIP APPLIE ROT 10 11.4 M/L (STAPLE) IMPLANT
CLIP LIGATING HEM O LOK PURPLE (MISCELLANEOUS) ×1 IMPLANT
CLIP LIGATING HEMO LOK XL GOLD (MISCELLANEOUS) IMPLANT
COVER MAYO STAND STRL (DRAPES) ×2 IMPLANT
COVER SURGICAL LIGHT HANDLE (MISCELLANEOUS) ×2 IMPLANT
DECANTER SPIKE VIAL GLASS SM (MISCELLANEOUS) ×2 IMPLANT
DRAIN CHANNEL 19F RND (DRAIN) IMPLANT
DRAPE C-ARM 42X120 X-RAY (DRAPES) ×2 IMPLANT
EVACUATOR SILICONE 100CC (DRAIN) IMPLANT
GLOVE BIOGEL PI IND STRL 7.0 (GLOVE) ×1 IMPLANT
GLOVE BIOGEL PI INDICATOR 7.0 (GLOVE) ×2
GLOVE SURG SS PI 7.0 STRL IVOR (GLOVE) ×4 IMPLANT
GOWN STRL REUS W/TWL LRG LVL3 (GOWN DISPOSABLE) ×4 IMPLANT
GOWN STRL REUS W/TWL XL LVL3 (GOWN DISPOSABLE) ×6 IMPLANT
GRASPER SUT TROCAR 14GX15 (MISCELLANEOUS) ×2 IMPLANT
IRRIG SUCT STRYKERFLOW 2 WTIP (MISCELLANEOUS) ×2
IRRIGATION SUCT STRKRFLW 2 WTP (MISCELLANEOUS) ×1 IMPLANT
KIT BASIN OR (CUSTOM PROCEDURE TRAY) ×2 IMPLANT
POUCH RETRIEVAL ECOSAC 10 (ENDOMECHANICALS) ×1 IMPLANT
POUCH RETRIEVAL ECOSAC 10MM (ENDOMECHANICALS) ×1
SCISSORS LAP 5X35 DISP (ENDOMECHANICALS) ×2 IMPLANT
SHEARS HARMONIC ACE PLUS 36CM (ENDOMECHANICALS) IMPLANT
SLEEVE XCEL OPT CAN 5 100 (ENDOMECHANICALS) ×4 IMPLANT
STOPCOCK 4 WAY LG BORE MALE ST (IV SETS) ×2 IMPLANT
STRIP CLOSURE SKIN 1/2X4 (GAUZE/BANDAGES/DRESSINGS) ×2 IMPLANT
SUT ETHILON 2 0 PS N (SUTURE) IMPLANT
SUT MNCRL AB 4-0 PS2 18 (SUTURE) ×3 IMPLANT
SUT VICRYL 0 ENDOLOOP (SUTURE) IMPLANT
TOWEL OR 17X26 10 PK STRL BLUE (TOWEL DISPOSABLE) ×2 IMPLANT
TOWEL OR NON WOVEN STRL DISP B (DISPOSABLE) ×1 IMPLANT
TRAY LAPAROSCOPIC (CUSTOM PROCEDURE TRAY) ×2 IMPLANT
TROCAR BLADELESS OPT 5 100 (ENDOMECHANICALS) ×2 IMPLANT
TROCAR XCEL 12X100 BLDLESS (ENDOMECHANICALS) ×2 IMPLANT
TUBING INSUF HEATED (TUBING) ×2 IMPLANT

## 2016-12-16 NOTE — Progress Notes (Signed)
PROGRESS NOTE    Randall Moore  FIE:[id number] DOB: [birth year] DOA: 12/15/2016 PCP: Christain Sacramento, MD   Chief Complaint  Patient presents with  . Back Pain  . Abdominal Pain    Brief Narrative:  71 year old male with history of coronary artery disease, hypertension, hyperlipidemia presented emergency department with complaints of abdominal pain. Ultrasound obtained showing gallstones and mild gallbladder wall thickening. Cholelithiasis noted on CTA abdomen and pelvis. General surgery consulted, status post laparoscopic cholecystectomy  Assessment & Plan   Abdominal pain/symptomatic cholelithiasis with possible acute cholecystitis -Ultrasound abdomen showed gallstones with mild gallbladder wall thickening, acute cholecystitis could not be ruled out -CTA abdomen pelvis did show cholelithiasis -Gen. surgery consulted appreciated, status post laparoscopic cholecystectomy -Patient placed on ciprofloxacin by general surgery -Continue pain control  Mildly elevated bilirubin -Continue to monitor BMP -?secondary to the above  Essential hypertension -Continue Coreg, Norvasc  Hyperlipidemia -Continue statin  History of coronary artery disease -Currently no chest pain -Patient did have cardiac catheterization 2017 which showed coronary disease, treated with aggressive medical therapy  DVT Prophylaxis  SCDs  Code Status: Full  Family Communication: Wife at bedside  Disposition Plan: Observation. Possible discharge in 24 hours pending recommendations from surgery.  Consultants General surgery  Procedures  Abdominal US Laparoscopic cholecystectomy  Antibiotics   Anti-infectives    Start     Dose/Rate Route Frequency Ordered Stop   12/15/16 1400  ciprofloxacin (CIPRO) IVPB 400 mg     400 mg 200 mL/hr over 60 Minutes Intravenous Every 12 hours 12/15/16 1346        Subjective:   Randall Moore seen and examined today.  Patient feels abdominal soreness but better since  yesterday. Denies chest pain, shortness of breath, current nausea, vomiting.   Objective:   Vitals:   12/16/16 1215 12/16/16 1230 12/16/16 1245 12/16/16 1300  BP: 131/71 129/77 138/77 (!) 148/77  Pulse: 76 77 78 76  Resp: 17 (!) 9 11 12   Temp:   99.1 F (37.3 C) 98.7 F (37.1 C)  TempSrc:      SpO2: 100% 99% 98% 99%  Weight:      Height:        Intake/Output Summary (Last 24 hours) at 12/16/16 1414 Last data filed at 12/16/16 1245  Gross per 24 hour  Intake           3087.5 ml  Output               50 ml  Net           3037.5 ml   Filed Weights   12/16/16 0200  Weight: 77.1 kg (170 lb)    Exam  General: Well developed, well nourished, NAD, appears stated age  HEENT: NCAT, mucous membranes moist.   Cardiovascular: S1 S2 auscultated, no rubs, murmurs or gallops. Regular rate and rhythm.  Respiratory: Clear to auscultation bilaterally with equal chest rise  Abdomen: Soft, mild TTP, nondistended, + bowel sounds, bandages in place  Extremities: warm dry without cyanosis clubbing or edema  Neuro: AAOx3, nonfocal  Psych: Normal affect and demeanor with intact judgement and insight   Data Reviewed: I have personally reviewed following labs and imaging studies  CBC:  Recent Labs Lab 12/15/16 0843 12/15/16 0858 12/16/16 0606  WBC 6.5  --  10.7*  NEUTROABS 4.6  --   --   HGB 16.9 15.6 15.3  HCT 45.7 46.0 42.3  MCV 91.4  --  93.6  PLT 145*  --  416*   Basic Metabolic Panel:  Recent Labs Lab 12/15/16 0843 12/15/16 0858 12/16/16 0606  NA 139 140 138  K 4.4 4.4 4.0  CL 106 105 102  CO2 25  --  28  GLUCOSE 121* 119* 116*  BUN 19 21* 17  CREATININE 1.07 1.00 1.05  CALCIUM 9.5  --  8.6*   GFR: Estimated Creatinine Clearance: 70.7 mL/min (by C-G formula based on SCr of 1.05 mg/dL). Liver Function Tests:  Recent Labs Lab 12/15/16 0843 12/16/16 0606  AST 33 21  ALT 34 26  ALKPHOS 73 64  BILITOT 0.9 1.5*  PROT 7.2 6.6  ALBUMIN 4.7 4.0     Recent Labs Lab 12/15/16 0843 12/16/16 0606  LIPASE 23 16   No results for input(s): AMMONIA in the last 168 hours. Coagulation Profile:  Recent Labs Lab 12/15/16 0843  INR 0.97   Cardiac Enzymes: No results for input(s): CKTOTAL, CKMB, CKMBINDEX, TROPONINI in the last 168 hours. BNP (last 3 results) No results for input(s): PROBNP in the last 8760 hours. HbA1C: No results for input(s): HGBA1C in the last 72 hours. CBG: No results for input(s): GLUCAP in the last 168 hours. Lipid Profile: No results for input(s): CHOL, HDL, LDLCALC, TRIG, CHOLHDL, LDLDIRECT in the last 72 hours. Thyroid Function Tests: No results for input(s): TSH, T4TOTAL, FREET4, T3FREE, THYROIDAB in the last 72 hours. Anemia Panel: No results for input(s): VITAMINB12, FOLATE, FERRITIN, TIBC, IRON, RETICCTPCT in the last 72 hours. Urine analysis:    Component Value Date/Time   COLORURINE RED BIOCHEMICALS MAY BE AFFECTED BY COLOR (A) 04/27/2010 0655   APPEARANCEUR TURBID (A) 04/27/2010 0655   LABSPEC 1.024 04/27/2010 0655   PHURINE 6.5 04/27/2010 0655   GLUCOSEU NEGATIVE 04/27/2010 0655   HGBUR LARGE (A) 04/27/2010 0655   BILIRUBINUR NEGATIVE 04/27/2010 0655   KETONESUR 15 (A) 04/27/2010 0655   PROTEINUR >300 (A) 04/27/2010 0655   UROBILINOGEN 1.0 04/27/2010 0655   NITRITE POSITIVE (A) 04/27/2010 0655   LEUKOCYTESUR MODERATE (A) 04/27/2010 0655   Sepsis Labs: @LABRCNTIP (procalcitonin:4,lacticidven:4)  ) Recent Results (from the past 240 hour(s))  Surgical pcr screen     Status: None   Collection Time: 12/15/16  3:53 PM  Result Value Ref Range Status   MRSA, PCR NEGATIVE NEGATIVE Final   Staphylococcus aureus NEGATIVE NEGATIVE Final    Comment:        The Xpert SA Assay (FDA approved for NASAL specimens in patients over 53 years of age), is one component of a comprehensive surveillance program.  Test performance has been validated by [facility] for patients greater than or equal  to 73 year old. It is not intended to diagnose infection nor to guide or monitor treatment.       Radiology Studies: Dg Chest 2 View  Result Date: 12/15/2016 CLINICAL DATA:  Chest pain. EXAM: CHEST  2 VIEW COMPARISON:  None. FINDINGS: The heart size and mediastinal contours are within normal limits. Both lungs are clear. No pneumothorax or pleural effusion is noted. The visualized skeletal structures are unremarkable. IMPRESSION: No active cardiopulmonary disease. Electronically Signed   By: Marijo Conception, M.D.   On: 12/15/2016 09:13   Dg Cholangiogram Operative  Result Date: 12/16/2016 CLINICAL DATA:  Cholecystectomy for cholelithiasis. EXAM: INTRAOPERATIVE CHOLANGIOGRAM TECHNIQUE: Cholangiographic images from the C-arm fluoroscopic device were submitted for interpretation post-operatively. Please see the procedural report for the amount of contrast and the fluoroscopy time utilized. COMPARISON:  CT and ultrasound studies on 12/15/2016 FINDINGS: Intraoperative  cholangiogram demonstrates normal opacified bile ducts without evidence of obstruction or filling defects. Single large calcified calculus is visible in the gallbladder lumen. There is some mild reflux of contrast into the pancreatic duct. No contrast extravasation identified. IMPRESSION: Unremarkable intraoperative cholangiogram. Electronically Signed   By: Aletta Edouard M.D.   On: 12/16/2016 11:29   US Abdomen Limited  Result Date: 12/15/2016 CLINICAL DATA:  Epigastric pain and right upper quadrant pain EXAM: US ABDOMEN LIMITED - RIGHT UPPER QUADRANT COMPARISON:  04/27/2010 FINDINGS: Gallbladder: 1.9 cm stone identified within the gallbladder neck. The gallbladder wall appears mildly thickened measuring 3.3 mm. Negative sonographic Murphy's sign. Common bile duct: Diameter: 4.1 Liver: No focal lesion identified. Within normal limits in parenchymal echogenicity. IMPRESSION: 1. Gallstones and mild gallbladder wall thickening. Negative  sonographic Murphy's sign. Cannot rule out early acute cholecystitis. If further imaging is clinically indicated consider nuclear medicine hepatobiliary scan. Electronically Signed   By: Kerby Moors M.D.   On: 12/15/2016 11:08   Ct Angio Abd/pel W And/or Wo Contrast  Result Date: 12/15/2016 CLINICAL DATA:  Abdominal pain.  Back pain.  Symptoms today. EXAM: CTA ABDOMEN AND PELVIS wITHOUT AND WITH CONTRAST TECHNIQUE: Multidetector CT imaging of the abdomen and pelvis was performed using the standard protocol during bolus administration of intravenous contrast. Multiplanar reconstructed images and MIPs were obtained and reviewed to evaluate the vascular anatomy. CONTRAST:  100 cc Isovue 370 COMPARISON:  04/27/2010 FINDINGS: VASCULAR Aorta: Aorta is non aneurysmal and patent. Minimal calcified and irregular plaque in the infrarenal aorta. Celiac: Minimal atherosclerotic changes at origin. No significant narrowing. SMA: Patent. Renals: Single renal arteries are patent. IMA: Patent. Inflow: Mild atherosclerotic calcifications of the iliac arteries. There is some irregular plaque involving the distal half of the right common iliac artery. There are web like areas of narrowing and some atherosclerotic changes. All of these findings may be purely due to atherosclerosis, however fibromuscular dysplasia superimposed upon atherosclerotic disease cannot be excluded. Minimal atherosclerotic calcification of the left common iliac artery without narrowing. External iliac arteries are widely patent. There is mild narrowing at the origin of the right internal iliac artery. Left internal iliac artery is patent. Proximal Outflow: Visualize femoral arteries are grossly patent. Review of the MIP images confirms the above findings. NON-VASCULAR Lower chest: Dependent atelectasis. Hepatobiliary: Large solitary gallstone is present. Liver is unremarkable in appearance. Pancreas: Unremarkable Spleen: Unremarkable Adrenals/Urinary  Tract: There are mild chronic changes involving the kidneys but no obvious mass or hydronephrosis. Adrenal glands are unremarkable. Bladder is unremarkable. Stomach/Bowel: Moderate stool burden throughout the length of the colon is present. There is no obvious mass in the colon. No evidence of small-bowel obstruction. Stomach is unremarkable. Lymphatic: No abnormal retroperitoneal adenopathy. Reproductive: Unremarkable appearance of the prostate gland. Other: No free-fluid. Musculoskeletal: There is no vertebral compression deformity. No obvious spinal stenosis. IMPRESSION: VASCULAR There are atherosclerotic changes within the abdomen and pelvis but no significant focal area of narrowing. There are changes in the right common iliac artery which may be purely related to atherosclerosis, however fibromuscular dysplasia superimposed upon atherosclerosis cannot be excluded. NON-VASCULAR Cholelithiasis. Prominent stool throughout the colon. Electronically Signed   By: Marybelle Killings M.D.   On: 12/15/2016 11:58     Scheduled Meds: . amLODipine  5 mg Oral QHS  . atorvastatin  80 mg Oral QPM  . carvedilol  3.125 mg Oral Daily  . HYDROmorphone      . loratadine  5 mg Oral Daily   Continuous Infusions: .  sodium chloride 75 mL/hr at 12/16/16 0212  . ciprofloxacin 400 mg (12/16/16 1412)  . lactated ringers       LOS: 0 days   Time Spent in minutes   30 minutes  Arelie Kuzel D.O. on 12/16/2016 at 2:14 PM  Between 7am to 7pm - Pager - [phone]  After 7pm go to [email or web address] - password [email or web address]  And look for the night coverage person covering for me after hours  [facility] Office  [phone]

## 2016-12-16 NOTE — Anesthesia Procedure Notes (Signed)
Procedure Name: Intubation Date/Time: 12/16/2016 10:27 AM Performed by: Rahsaan Weakland, Virgel Gess Pre-anesthesia Checklist: Patient identified, Emergency Drugs available, Suction available, Patient being monitored and Timeout performed Patient Re-evaluated:Patient Re-evaluated prior to inductionOxygen Delivery Method: Circle system utilized Preoxygenation: Pre-oxygenation with 100% oxygen Intubation Type: IV induction Ventilation: Mask ventilation without difficulty Laryngoscope Size: Mac and 4 Grade View: Grade I Tube type: Oral Tube size: 7.5 mm Number of attempts: 1 Airway Equipment and Method: Stylet Placement Confirmation: ETT inserted through vocal cords under direct vision,  positive ETCO2,  CO2 detector and breath sounds checked- equal and bilateral Secured at: 22 cm Tube secured with: Tape Dental Injury: Teeth and Oropharynx as per pre-operative assessment

## 2016-12-16 NOTE — Op Note (Signed)
PATIENT:  Randall Moore  71 y.o. male  PRE-OPERATIVE DIAGNOSIS:  Symptomatic cholelithiasis  POST-OPERATIVE DIAGNOSIS:  Acute cholecystitis  PROCEDURE:  Procedure(s): LAPAROSCOPIC CHOLECYSTECTOMY WITH INTRAOPERATIVE CHOLANGIOGRAM  SURGEON:  Surgeon(s): Denaisha Swango, Arta Bruce, MD  ASSISTANT: Jackson Latino, P.A.  ANESTHESIA:   local and general  Indications for procedure: Randall Moore is a 71 y.o. male with symptoms of Abdominal pain and Nausea and vomiting consistent with gallbladder disease, Confirmed by Ultrasound.  Description of procedure: The patient was brought into the operative suite, placed supine. Anesthesia was administered with endotracheal tube. Patient was strapped in place and foot board was secured. All pressure points were offloaded by foam padding. The patient was prepped and draped in the usual sterile fashion.  A small incision was made to the right of the umbilicus. A 78mm trocar was inserted into the peritoneal cavity with optical entry. Pneumoperitoneum was applied with high flow low pressure. 2 4mm trocars were placed in the RUQ. A 37mm trocar was placed in the subxiphoid space. All trocars sites were first anesthesized with 0.25% marcaine with epinephrine in the subcutaneous and preperitoneal layers. Next the patient was placed in reverse trendelenberg. The gallbladder was thickened, tense and inflamed, a drainage needle was used to decompress the gallbladder.  The gallbladder was retracted cephalad and lateral. The peritoneum was reflected off the infundibulum working lateral to medial. The cystic duct and cystic artery were identified and further dissection revealed a critical view, due to concern for choledocholithiasis a cholangiogram was performed with [name]. Normal ductal anatomy was seen by cholangiogram. The cystic duct and cystic artery were doubly clipped and ligated.   The gallbladder was removed off the liver bed with cautery. The Gallbladder was  placed in a specimen bag. The gallbladder fossa was irrigated and hemostasis was applied with cautery. The gallbladder was removed via the 8mm trocar. The fascial defect was closed with interrupted 0 vicryl suture via laparoscopic trans-fascial suture passer. Pneumoperitoneum was removed, all trocar were removed. All incisions were closed with 4-0 monocryl subcuticular stitch. The patient woke from anesthesia and was brought to PACU in stable condition. All counts were correct  Findings: inflamed gallbladder, normal ductal anatomy  Specimen: gallbladder  Blood loss: Total I/O In: 1393.8 [I.V.:1393.8] Out: 50 [Blood:50] ml  Local anesthesia: 88ml 0.93% marcaine  Complications: none  PLAN OF CARE: Admit to inpatient   PATIENT DISPOSITION:  PACU - hemodynamically stable.  [name], M.D. General, Bariatric, & Minimally Invasive Surgery [facility] [place]

## 2016-12-16 NOTE — Anesthesia Postprocedure Evaluation (Signed)
Anesthesia Post Note  Patient: [name]  Procedure(s) Performed: Procedure(s) (LRB): LAPAROSCOPIC CHOLECYSTECTOMY WITH INTRAOPERATIVE CHOLANGIOGRAM (N/A)  Patient location during evaluation: PACU Anesthesia Type: General Level of consciousness: awake and alert Pain management: pain level controlled Vital Signs Assessment: post-procedure vital signs reviewed and stable Respiratory status: spontaneous breathing, nonlabored ventilation, respiratory function stable and patient connected to nasal cannula oxygen Cardiovascular status: blood pressure returned to baseline and stable Postop Assessment: no signs of nausea or vomiting Anesthetic complications: no       Last Vitals:  Vitals:   12/16/16 1245 12/16/16 1300  BP: 138/77 (!) 148/77  Pulse: 78 76  Resp: 11 12  Temp: 37.3 C 37.1 C    Last Pain:  Vitals:   12/16/16 1245  TempSrc:   PainSc: 3                  Abelardo Seidner S

## 2016-12-16 NOTE — Progress Notes (Signed)
Date of Initial H&P: 12/15/16  History reviewed, patient examined, bilirubin increased, otherwise no clinical change, stable for surgery.

## 2016-12-16 NOTE — Anesthesia Preprocedure Evaluation (Signed)
Anesthesia Evaluation  Patient identified by MRN, date of birth, ID band Patient awake    Reviewed: Allergy & Precautions, NPO status , Patient's Chart, lab work & pertinent test results  Airway Mallampati: II  TM Distance: >3 FB Neck ROM: Full    Dental no notable dental hx.    Pulmonary neg pulmonary ROS, former smoker,    Pulmonary exam normal breath sounds clear to auscultation       Cardiovascular negative cardio ROS Normal cardiovascular exam Rhythm:Regular Rate:Normal     Neuro/Psych negative neurological ROS  negative psych ROS   GI/Hepatic negative GI ROS, Neg liver ROS,   Endo/Other  negative endocrine ROS  Renal/GU negative Renal ROS  negative genitourinary   Musculoskeletal negative musculoskeletal ROS (+)   Abdominal   Peds negative pediatric ROS (+)  Hematology negative hematology ROS (+)   Anesthesia Other Findings   Reproductive/Obstetrics negative OB ROS                             Anesthesia Physical Anesthesia Plan  ASA: II  Anesthesia Plan: General   Post-op Pain Management:    Induction: Intravenous  Airway Management Planned: Oral ETT  Additional Equipment:   Intra-op Plan:   Post-operative Plan: Extubation in OR  Informed Consent: I have reviewed the patients History and Physical, chart, labs and discussed the procedure including the risks, benefits and alternatives for the proposed anesthesia with the patient or authorized representative who has indicated his/her understanding and acceptance.   Dental advisory given  Plan Discussed with: CRNA and Surgeon  Anesthesia Plan Comments:         Anesthesia Quick Evaluation

## 2016-12-16 NOTE — Transfer of Care (Signed)
Immediate Anesthesia Transfer of Care Note  Patient: Randall Moore  Procedure(s) Performed: Procedure(s): LAPAROSCOPIC CHOLECYSTECTOMY WITH INTRAOPERATIVE CHOLANGIOGRAM (N/A)  Patient Location: PACU  Anesthesia Type:General  Level of Consciousness:  sedated, patient cooperative and responds to stimulation  Airway & Oxygen Therapy:Patient Spontanous Breathing and Patient connected to face mask oxgen  Post-op Assessment:  Report given to PACU RN and Post -op Vital signs reviewed and stable  Post vital signs:  Reviewed and stable  Last Vitals:  Vitals:   12/16/16 0718 12/16/16 0839  BP: 130/65 131/70  Pulse: 76 79  Resp: 16 16  Temp: 37.4 C (!) 31.4 C    Complications: No apparent anesthesia complications

## 2016-12-17 ENCOUNTER — Encounter (HOSPITAL_COMMUNITY): Payer: Self-pay | Admitting: General Surgery

## 2016-12-17 DIAGNOSIS — K8020 Calculus of gallbladder without cholecystitis without obstruction: Secondary | ICD-10-CM | POA: Diagnosis not present

## 2016-12-17 DIAGNOSIS — E784 Other hyperlipidemia: Secondary | ICD-10-CM | POA: Diagnosis not present

## 2016-12-17 DIAGNOSIS — K810 Acute cholecystitis: Secondary | ICD-10-CM

## 2016-12-17 DIAGNOSIS — E785 Hyperlipidemia, unspecified: Secondary | ICD-10-CM | POA: Diagnosis not present

## 2016-12-17 DIAGNOSIS — I2510 Atherosclerotic heart disease of native coronary artery without angina pectoris: Secondary | ICD-10-CM | POA: Diagnosis not present

## 2016-12-17 MED ORDER — HYDROCODONE-ACETAMINOPHEN 5-325 MG PO TABS: 1.0000 | ORAL_TABLET | Freq: Four times a day (QID) | ORAL | 0 refills | Status: DC | PRN

## 2016-12-17 NOTE — Discharge Summary (Signed)
Physician Discharge Summary  Randall Moore PJK:[id number] DOB: 01/01/1946 DOA: 12/15/2016  PCP: Christain Sacramento, MD  Admit date: 12/15/2016 Discharge date: 12/17/2016  Admitted From: home Disposition:  Home  Recommendations for Outpatient Follow-up:  1. Follow up with PCP in 1-2 weeks 2. Please obtain BMP/CBC in one week 3. Follow up with Dr. [name] in 2 weeks    Discharge Condition: Stable CODE STATUS: FULL Diet recommendation: Heart Healthy   Brief/Interim Summary: 71 year old male with history of coronary artery disease, hypertension, hyperlipidemia presented emergency department with complaints of abdominal pain. Ultrasound obtained showing gallstones and mild gallbladder wall thickening. Cholelithiasis noted on CTA abdomen and pelvis. General surgery consulted, status post laparoscopic cholecystectomy  Discharge Diagnoses:  Abdominal pain/symptomatic cholelithiasis with acute cholecystitis -Ultrasound abdomen showed gallstones with mild gallbladder wall thickening, acute cholecystitis could not be ruled out -CTA abdomen pelvis showed A large solitary gallstone without any significant focal areas of narrowing vascular-wise -Gen. surgery consulted appreciated, status post laparoscopic cholecystectomy 12/16/2016 -Patient placed on ciprofloxacin by general surgery--will not continue post-op -diet advanced post-op which pt tolerated  Mildly elevated bilirubin -secondary to the above  Essential hypertension -Continue Coreg, Norvasc -controlled  Hyperlipidemia -Continue statin  History of coronary artery disease -Currently no chest pain -Patient did have cardiac catheterization 2017 which showed coronary disease, treated with aggressive medical therapy    Discharge Instructions  Discharge Instructions    Diet - low sodium heart healthy    Complete by:  As directed    Increase activity slowly    Complete by:  As directed      Allergies as of 12/17/2016        Reactions   Ibuprofen Hives, Swelling   tongue and lips swell   Peanut-containing Drug Products Hives   Penicillins Hives, Swelling   tongue and lips swell, Has patient had a PCN reaction causing immediate rash, facial/tongue/throat swelling, SOB or lightheadedness with hypotension: Yes Has patient had a PCN reaction causing severe rash involving mucus membranes or skin necrosis: No Has patient had a PCN reaction that required hospitalization No Has patient had a PCN reaction occurring within the last 10 years: No If all of the above answers are "NO", then may proceed with Cephalosporin use.   Tomato Hives      Medication List    TAKE these medications   amLODipine 5 MG tablet Commonly known as:  NORVASC Take 5 mg by mouth at bedtime.   aspirin EC 81 MG tablet Take 81 mg by mouth every morning.   atorvastatin 80 MG tablet Commonly known as:  LIPITOR Take 80 mg by mouth every evening.   CALCIUM PO Take 600 mg by mouth daily.   carvedilol 3.125 MG tablet Commonly known as:  COREG Take 1 tablet by mouth daily.   clobetasol ointment 0.05 % Commonly known as:  TEMOVATE Apply 1 application topically 3 (three) times daily as needed (for rash or itching).   desloratadine 5 MG tablet Commonly known as:  CLARINEX Take 5 mg by mouth daily.   docusate sodium 100 MG capsule Commonly known as:  COLACE Take 100 mg by mouth daily.   ferrous sulfate 325 (65 FE) MG tablet Take 325 mg by mouth every other day. In the morning   fish oil-omega-3 fatty acids 1000 MG capsule Take 1,000 mg by mouth daily.   HYDROcodone-acetaminophen 5-325 MG tablet Commonly known as:  NORCO/VICODIN Take 1 tablet by mouth every 6 (six) hours as needed for moderate pain.  MULTI-VITAMINS Tabs Take 1 tablet by mouth.   nitroGLYCERIN 0.4 MG SL tablet Commonly known as:  NITROSTAT Place 0.4 mg under the tongue every 5 (five) minutes as needed for chest pain.   RA SAW PALMETTO 80 MG  capsule Generic drug:  saw palmetto Take 320 mg by mouth daily.      Follow-up [facility], [place]. Call.   Specialty:  General Surgery Why:  to confirm appointment date and time Contact information: [place] [address] [phone]         Allergies  Allergen Reactions  . Ibuprofen Hives and Swelling    tongue and lips swell  . Peanut-Containing Drug Products Hives  . Penicillins Hives and Swelling    tongue and lips swell, Has patient had a PCN reaction causing immediate rash, facial/tongue/throat swelling, SOB or lightheadedness with hypotension: Yes Has patient had a PCN reaction causing severe rash involving mucus membranes or skin necrosis: No Has patient had a PCN reaction that required hospitalization No Has patient had a PCN reaction occurring within the last 10 years: No If all of the above answers are "NO", then may proceed with Cephalosporin use.   Wendie Chess Hives    Consultations:  General surgery--[facility]   Procedures/Studies: Dg Chest 2 View  Result Date: 12/15/2016 CLINICAL DATA:  Chest pain. EXAM: CHEST  2 VIEW COMPARISON:  None. FINDINGS: The heart size and mediastinal contours are within normal limits. Both lungs are clear. No pneumothorax or pleural effusion is noted. The visualized skeletal structures are unremarkable. IMPRESSION: No active cardiopulmonary disease. Electronically Signed   By: Marijo Conception, M.D.   On: 12/15/2016 09:13   Dg Cholangiogram Operative  Result Date: 12/16/2016 CLINICAL DATA:  Cholecystectomy for cholelithiasis. EXAM: INTRAOPERATIVE CHOLANGIOGRAM TECHNIQUE: Cholangiographic images from the C-arm fluoroscopic device were submitted for interpretation post-operatively. Please see the procedural report for the amount of contrast and the fluoroscopy time utilized. COMPARISON:  CT and ultrasound studies on 12/15/2016 FINDINGS: Intraoperative cholangiogram  demonstrates normal opacified bile ducts without evidence of obstruction or filling defects. Single large calcified calculus is visible in the gallbladder lumen. There is some mild reflux of contrast into the pancreatic duct. No contrast extravasation identified. IMPRESSION: Unremarkable intraoperative cholangiogram. Electronically Signed   By: Aletta Edouard M.D.   On: 12/16/2016 11:29   US Abdomen Limited  Result Date: 12/15/2016 CLINICAL DATA:  Epigastric pain and right upper quadrant pain EXAM: US ABDOMEN LIMITED - RIGHT UPPER QUADRANT COMPARISON:  04/27/2010 FINDINGS: Gallbladder: 1.9 cm stone identified within the gallbladder neck. The gallbladder wall appears mildly thickened measuring 3.3 mm. Negative sonographic Murphy's sign. Common bile duct: Diameter: 4.1 Liver: No focal lesion identified. Within normal limits in parenchymal echogenicity. IMPRESSION: 1. Gallstones and mild gallbladder wall thickening. Negative sonographic Murphy's sign. Cannot rule out early acute cholecystitis. If further imaging is clinically indicated consider nuclear medicine hepatobiliary scan. Electronically Signed   By: Kerby Moors M.D.   On: 12/15/2016 11:08   Ct Angio Abd/pel W And/or Wo Contrast  Result Date: 12/15/2016 CLINICAL DATA:  Abdominal pain.  Back pain.  Symptoms today. EXAM: CTA ABDOMEN AND PELVIS wITHOUT AND WITH CONTRAST TECHNIQUE: Multidetector CT imaging of the abdomen and pelvis was performed using the standard protocol during bolus administration of intravenous contrast. Multiplanar reconstructed images and MIPs were obtained and reviewed to evaluate the vascular anatomy. CONTRAST:  100 cc Isovue 370 COMPARISON:  04/27/2010 FINDINGS: VASCULAR Aorta: Aorta  is non aneurysmal and patent. Minimal calcified and irregular plaque in the infrarenal aorta. Celiac: Minimal atherosclerotic changes at origin. No significant narrowing. SMA: Patent. Renals: Single renal arteries are patent. IMA: Patent. Inflow:  Mild atherosclerotic calcifications of the iliac arteries. There is some irregular plaque involving the distal half of the right common iliac artery. There are web like areas of narrowing and some atherosclerotic changes. All of these findings may be purely due to atherosclerosis, however fibromuscular dysplasia superimposed upon atherosclerotic disease cannot be excluded. Minimal atherosclerotic calcification of the left common iliac artery without narrowing. External iliac arteries are widely patent. There is mild narrowing at the origin of the right internal iliac artery. Left internal iliac artery is patent. Proximal Outflow: Visualize femoral arteries are grossly patent. Review of the MIP images confirms the above findings. NON-VASCULAR Lower chest: Dependent atelectasis. Hepatobiliary: Large solitary gallstone is present. Liver is unremarkable in appearance. Pancreas: Unremarkable Spleen: Unremarkable Adrenals/Urinary Tract: There are mild chronic changes involving the kidneys but no obvious mass or hydronephrosis. Adrenal glands are unremarkable. Bladder is unremarkable. Stomach/Bowel: Moderate stool burden throughout the length of the colon is present. There is no obvious mass in the colon. No evidence of small-bowel obstruction. Stomach is unremarkable. Lymphatic: No abnormal retroperitoneal adenopathy. Reproductive: Unremarkable appearance of the prostate gland. Other: No free-fluid. Musculoskeletal: There is no vertebral compression deformity. No obvious spinal stenosis. IMPRESSION: VASCULAR There are atherosclerotic changes within the abdomen and pelvis but no significant focal area of narrowing. There are changes in the right common iliac artery which may be purely related to atherosclerosis, however fibromuscular dysplasia superimposed upon atherosclerosis cannot be excluded. NON-VASCULAR Cholelithiasis. Prominent stool throughout the colon. Electronically Signed   By: Marybelle Killings M.D.   On:  12/15/2016 11:58         Discharge Exam: Vitals:   12/17/16 0839 12/17/16 0926  BP: 135/68 125/63  Pulse: 73 76  Resp:  15  Temp:  98.9 F (37.2 C)   Vitals:   12/17/16 0154 12/17/16 0619 12/17/16 0839 12/17/16 0926  BP: (!) 110/56 111/65 135/68 125/63  Pulse: 77 74 73 76  Resp: 16 16  15   Temp: 98.8 F (37.1 C) 98.1 F (36.7 C)  98.9 F (37.2 C)  TempSrc: Oral Oral  Oral  SpO2: 96% 96%  96%  Weight:      Height:        General: Pt is alert, awake, not in acute distress Cardiovascular: RRR, S1/S2 +, no rubs, no gallops Respiratory: CTA bilaterally, no wheezing, no rhonchi Abdominal: Soft, NT, ND, bowel sounds + Extremities: no edema, no cyanosis   The results of significant diagnostics from this hospitalization (including imaging, microbiology, ancillary and laboratory) are listed below for reference.    Significant Diagnostic Studies: Dg Chest 2 View  Result Date: 12/15/2016 CLINICAL DATA:  Chest pain. EXAM: CHEST  2 VIEW COMPARISON:  None. FINDINGS: The heart size and mediastinal contours are within normal limits. Both lungs are clear. No pneumothorax or pleural effusion is noted. The visualized skeletal structures are unremarkable. IMPRESSION: No active cardiopulmonary disease. Electronically Signed   By: Marijo Conception, M.D.   On: 12/15/2016 09:13   Dg Cholangiogram Operative  Result Date: 12/16/2016 CLINICAL DATA:  Cholecystectomy for cholelithiasis. EXAM: INTRAOPERATIVE CHOLANGIOGRAM TECHNIQUE: Cholangiographic images from the C-arm fluoroscopic device were submitted for interpretation post-operatively. Please see the procedural report for the amount of contrast and the fluoroscopy time utilized. COMPARISON:  CT and ultrasound studies on 12/15/2016 FINDINGS:  Intraoperative cholangiogram demonstrates normal opacified bile ducts without evidence of obstruction or filling defects. Single large calcified calculus is visible in the gallbladder lumen. There is some mild  reflux of contrast into the pancreatic duct. No contrast extravasation identified. IMPRESSION: Unremarkable intraoperative cholangiogram. Electronically Signed   By: Aletta Edouard M.D.   On: 12/16/2016 11:29   US Abdomen Limited  Result Date: 12/15/2016 CLINICAL DATA:  Epigastric pain and right upper quadrant pain EXAM: US ABDOMEN LIMITED - RIGHT UPPER QUADRANT COMPARISON:  04/27/2010 FINDINGS: Gallbladder: 1.9 cm stone identified within the gallbladder neck. The gallbladder wall appears mildly thickened measuring 3.3 mm. Negative sonographic Murphy's sign. Common bile duct: Diameter: 4.1 Liver: No focal lesion identified. Within normal limits in parenchymal echogenicity. IMPRESSION: 1. Gallstones and mild gallbladder wall thickening. Negative sonographic Murphy's sign. Cannot rule out early acute cholecystitis. If further imaging is clinically indicated consider nuclear medicine hepatobiliary scan. Electronically Signed   By: Kerby Moors M.D.   On: 12/15/2016 11:08   Ct Angio Abd/pel W And/or Wo Contrast  Result Date: 12/15/2016 CLINICAL DATA:  Abdominal pain.  Back pain.  Symptoms today. EXAM: CTA ABDOMEN AND PELVIS wITHOUT AND WITH CONTRAST TECHNIQUE: Multidetector CT imaging of the abdomen and pelvis was performed using the standard protocol during bolus administration of intravenous contrast. Multiplanar reconstructed images and MIPs were obtained and reviewed to evaluate the vascular anatomy. CONTRAST:  100 cc Isovue 370 COMPARISON:  04/27/2010 FINDINGS: VASCULAR Aorta: Aorta is non aneurysmal and patent. Minimal calcified and irregular plaque in the infrarenal aorta. Celiac: Minimal atherosclerotic changes at origin. No significant narrowing. SMA: Patent. Renals: Single renal arteries are patent. IMA: Patent. Inflow: Mild atherosclerotic calcifications of the iliac arteries. There is some irregular plaque involving the distal half of the right common iliac artery. There are web like areas of  narrowing and some atherosclerotic changes. All of these findings may be purely due to atherosclerosis, however fibromuscular dysplasia superimposed upon atherosclerotic disease cannot be excluded. Minimal atherosclerotic calcification of the left common iliac artery without narrowing. External iliac arteries are widely patent. There is mild narrowing at the origin of the right internal iliac artery. Left internal iliac artery is patent. Proximal Outflow: Visualize femoral arteries are grossly patent. Review of the MIP images confirms the above findings. NON-VASCULAR Lower chest: Dependent atelectasis. Hepatobiliary: Large solitary gallstone is present. Liver is unremarkable in appearance. Pancreas: Unremarkable Spleen: Unremarkable Adrenals/Urinary Tract: There are mild chronic changes involving the kidneys but no obvious mass or hydronephrosis. Adrenal glands are unremarkable. Bladder is unremarkable. Stomach/Bowel: Moderate stool burden throughout the length of the colon is present. There is no obvious mass in the colon. No evidence of small-bowel obstruction. Stomach is unremarkable. Lymphatic: No abnormal retroperitoneal adenopathy. Reproductive: Unremarkable appearance of the prostate gland. Other: No free-fluid. Musculoskeletal: There is no vertebral compression deformity. No obvious spinal stenosis. IMPRESSION: VASCULAR There are atherosclerotic changes within the abdomen and pelvis but no significant focal area of narrowing. There are changes in the right common iliac artery which may be purely related to atherosclerosis, however fibromuscular dysplasia superimposed upon atherosclerosis cannot be excluded. NON-VASCULAR Cholelithiasis. Prominent stool throughout the colon. Electronically Signed   By: Marybelle Killings M.D.   On: 12/15/2016 11:58     Microbiology: Recent Results (from the past 240 hour(s))  Surgical pcr screen     Status: None   Collection Time: 12/15/16  3:53 PM  Result Value Ref Range  Status   MRSA, PCR NEGATIVE NEGATIVE Final   Staphylococcus  aureus NEGATIVE NEGATIVE Final    Comment:        The Xpert SA Assay (FDA approved for NASAL specimens in patients over 83 years of age), is one component of a comprehensive surveillance program.  Test performance has been validated by [facility] for patients greater than or equal to 54 year old. It is not intended to diagnose infection nor to guide or monitor treatment.      Labs: Basic Metabolic Panel:  Recent Labs Lab 12/15/16 0843 12/15/16 0858 12/16/16 0606  NA 139 140 138  K 4.4 4.4 4.0  CL 106 105 102  CO2 25  --  28  GLUCOSE 121* 119* 116*  BUN 19 21* 17  CREATININE 1.07 1.00 1.05  CALCIUM 9.5  --  8.6*   Liver Function Tests:  Recent Labs Lab 12/15/16 0843 12/16/16 0606  AST 33 21  ALT 34 26  ALKPHOS 73 64  BILITOT 0.9 1.5*  PROT 7.2 6.6  ALBUMIN 4.7 4.0    Recent Labs Lab 12/15/16 0843 12/16/16 0606  LIPASE 23 16   No results for input(s): AMMONIA in the last 168 hours. CBC:  Recent Labs Lab 12/15/16 0843 12/15/16 0858 12/16/16 0606  WBC 6.5  --  10.7*  NEUTROABS 4.6  --   --   HGB 16.9 15.6 15.3  HCT 45.7 46.0 42.3  MCV 91.4  --  93.6  PLT 145*  --  141*   Cardiac Enzymes: No results for input(s): CKTOTAL, CKMB, CKMBINDEX, TROPONINI in the last 168 hours. BNP: Invalid input(s): POCBNP CBG: No results for input(s): GLUCAP in the last 168 hours.  Time coordinating discharge:  Greater than 30 minutes  Signed:  Strother Everitt, DO [facility] Pager: [phone] 12/17/2016, 10:52 AM

## 2016-12-17 NOTE — Discharge Instructions (Signed)
Please arrive at least 30 min before your appointment to complete your check in paperwork.  If you are unable to arrive 30 min prior to your appointment time we may have to cancel or reschedule you.  LAPAROSCOPIC SURGERY: POST OP INSTRUCTIONS  1. DIET: Follow a light bland diet the first 24 hours after arrival home, such as soup, liquids, crackers, etc. Be sure to include lots of fluids daily. Avoid fast food or heavy meals as your are more likely to get nauseated. Eat a low fat the next few days after surgery.  2. Take your usually prescribed home medications unless otherwise directed. 3. PAIN CONTROL:  1. Pain is best controlled by a usual combination of three different methods TOGETHER:  1. Ice/Heat 2. Over the counter pain medication 3. Prescription pain medication 2. Most patients will experience some swelling and bruising around the incisions. Ice packs or heating pads (30-60 minutes up to 6 times a day) will help. Use ice for the first few days to help decrease swelling and bruising, then switch to heat to help relax tight/sore spots and speed recovery. Some people prefer to use ice alone, heat alone, alternating between ice & heat. Experiment to what works for you. Swelling and bruising can take several weeks to resolve.  3. It is helpful to take an over-the-counter pain medication regularly for the first few weeks. Choose one of the following that works best for you:  1. Naproxen (Aleve, etc) Two 220mg  tabs twice a day 2. Ibuprofen (Advil, etc) Three 200mg  tabs four times a day (every meal & bedtime) 3. Acetaminophen (Tylenol, etc) 500-650mg  four times a day (every meal & bedtime) 4. A prescription for pain medication (such as oxycodone, hydrocodone, etc) should be given to you upon discharge. Take your pain medication as prescribed.  1. If you are having problems/concerns with the prescription medicine (does not control pain, nausea, vomiting, rash, itching, etc), please call us [phone]  [phone] to see if we need to switch you to a different pain medicine that will work better for you and/or control your side effect better. 2. If you need a refill on your pain medication, please contact your pharmacy. They will contact our office to request authorization. Prescriptions will not be filled after 5 pm or on week-ends. 4. Avoid getting constipated. Between the surgery and the pain medications, it is common to experience some constipation. Increasing fluid intake and taking a fiber supplement (such as Metamucil, Citrucel, FiberCon, MiraLax, etc) 1-2 times a day regularly will usually help prevent this problem from occurring. A mild laxative (prune juice, Milk of Magnesia, MiraLax, etc) should be taken according to package directions if there are no bowel movements after 48 hours.  5. Watch out for diarrhea. If you have many loose bowel movements, simplify your diet to bland foods & liquids for a few days. Stop any stool softeners and decrease your fiber supplement. Switching to mild anti-diarrheal medications (Kayopectate, Pepto Bismol) can help. If this worsens or does not improve, please call us. 6. Wash / shower every day. You may shower tomorrow and remove the bandages. Leave the steri-strip in place. No need to recover your incisions. 7. ACTIVITIES as tolerated:  1. You may resume regular (light) daily activities beginning the next day--such as daily self-care, walking, climbing stairs--gradually increasing activities as tolerated. If you can walk 30 minutes without difficulty, it is safe to try more intense activity such as jogging, treadmill, bicycling, low-impact aerobics, swimming, etc. 2. Save the most  intensive and strenuous activity for last such as sit-ups, heavy lifting, contact sports, etc Refrain from any heavy lifting or straining until you are off narcotics for pain control.  3. DO NOT PUSH THROUGH PAIN. Let pain be your guide: If it hurts to do something, don't do it. Pain is  your body warning you to avoid that activity for another week until the pain goes down. 4. You may drive when you are no longer taking prescription pain medication, you can comfortably wear a seatbelt, and you can safely maneuver your car and apply brakes. 5. You may have sexual intercourse when it is comfortable.  8. FOLLOW UP in our office  1. Please call [facility] at [phone] to set up an appointment to see your surgeon in the office for a follow-up appointment approximately 2-3 weeks after your surgery. 2. Make sure that you call for this appointment the day you arrive home to insure a convenient appointment time.      10. IF YOU HAVE DISABILITY OR FAMILY LEAVE FORMS, BRING THEM TO THE               OFFICE FOR PROCESSING.   WHEN TO CALL us [phone]:  1. Poor pain control 2. Reactions / problems with new medications (rash/itching, nausea, etc)  3. Fever over 101.5 F (38.5 C) 4. Inability to urinate 5. Nausea and/or vomiting 6. Worsening swelling or bruising 7. Continued bleeding from incision. 8. Increased pain, redness, or drainage from the incision  The clinic staff is available to answer your questions during regular business hours (8:30am-5pm). Please dont hesitate to call and ask to speak to one of our nurses for clinical concerns.  If you have a medical emergency, go to the nearest emergency room or call 911.  A surgeon from [facility] is always on call at the [facility], [place], [place], [place], [place] [address] ?  MAIN: [phone] ? TOLL FREE: [phone] ?  FAX [phone]) [phone]  [email or web address]

## 2016-12-17 NOTE — Progress Notes (Signed)
Discharge planning, no HH needs identified. [phone] 

## 2016-12-17 NOTE — Progress Notes (Signed)
[facility]/Trauma Progress Note  1 Day Post-Op   Subjective:  CC: abdominal soreness  No acute events overnight. Tolerating diet, ambulating, no fevers. Minimal abdominal pain.  Objective: Vital signs in last 24 hours: Temp:  [97.4 F (36.3 C)-100.5 F (38.1 C)] 98.1 F (36.7 C) (05/09 0619) Pulse Rate:  [74-82] 74 (05/09 0619) Resp:  [9-17] 16 (05/09 0619) BP: (110-178)/(56-100) 111/65 (05/09 0619) SpO2:  [93 %-100 %] 96 % (05/09 0619) Last BM Date: 12/13/16  Intake/Output from previous day: 05/08 0701 - 05/09 0700 In: 4628.8 [P.O.:960; I.V.:3268.8; IV Piggyback:400] Out: 2100 [Urine:2050; Blood:50] Intake/Output this shift: No intake/output data recorded.  PE: Gen:  Alert, NAD, pleasant, cooperative, well appearing Card:  RRR, no M/G/R heard Pulm:  CTA, no W/R/R, effort normal Abd: Soft, not distended, +BS, no HSM, incisions C/D/I, mild generalized TTP Skin: no rashes noted, warm and dry  Lab Results:   Recent Labs  12/15/16 0843 12/15/16 0858 12/16/16 0606  WBC 6.5  --  10.7*  HGB 16.9 15.6 15.3  HCT 45.7 46.0 42.3  PLT 145*  --  141*   BMET  Recent Labs  12/15/16 0843 12/15/16 0858 12/16/16 0606  NA 139 140 138  K 4.4 4.4 4.0  CL 106 105 102  CO2 25  --  28  GLUCOSE 121* 119* 116*  BUN 19 21* 17  CREATININE 1.07 1.00 1.05  CALCIUM 9.5  --  8.6*   PT/INR  Recent Labs  12/15/16 0843  LABPROT 12.9  INR 0.97   CMP     Component Value Date/Time   NA 138 12/16/2016 0606   K 4.0 12/16/2016 0606   CL 102 12/16/2016 0606   CO2 28 12/16/2016 0606   GLUCOSE 116 (H) 12/16/2016 0606   BUN 17 12/16/2016 0606   CREATININE 1.05 12/16/2016 0606   CALCIUM 8.6 (L) 12/16/2016 0606   PROT 6.6 12/16/2016 0606   ALBUMIN 4.0 12/16/2016 0606   AST 21 12/16/2016 0606   ALT 26 12/16/2016 0606   ALKPHOS 64 12/16/2016 0606   BILITOT 1.5 (H) 12/16/2016 0606   GFRNONAA >60 12/16/2016 0606   GFRAA >60 12/16/2016 0606   Lipase      Component Value Date/Time   LIPASE 16 12/16/2016 0606    Studies/Results: Dg Chest 2 View  Result Date: 12/15/2016 CLINICAL DATA:  Chest pain. EXAM: CHEST  2 VIEW COMPARISON:  None. FINDINGS: The heart size and mediastinal contours are within normal limits. Both lungs are clear. No pneumothorax or pleural effusion is noted. The visualized skeletal structures are unremarkable. IMPRESSION: No active cardiopulmonary disease. Electronically Signed   By: Marijo Conception, M.D.   On: 12/15/2016 09:13   Dg Cholangiogram Operative  Result Date: 12/16/2016 CLINICAL DATA:  Cholecystectomy for cholelithiasis. EXAM: INTRAOPERATIVE CHOLANGIOGRAM TECHNIQUE: Cholangiographic images from the C-arm fluoroscopic device were submitted for interpretation post-operatively. Please see the procedural report for the amount of contrast and the fluoroscopy time utilized. COMPARISON:  CT and ultrasound studies on 12/15/2016 FINDINGS: Intraoperative cholangiogram demonstrates normal opacified bile ducts without evidence of obstruction or filling defects. Single large calcified calculus is visible in the gallbladder lumen. There is some mild reflux of contrast into the pancreatic duct. No contrast extravasation identified. IMPRESSION: Unremarkable intraoperative cholangiogram. Electronically Signed   By: Aletta Edouard M.D.   On: 12/16/2016 11:29   US Abdomen Limited  Result Date: 12/15/2016 CLINICAL DATA:  Epigastric pain and right upper quadrant pain EXAM: US ABDOMEN LIMITED - RIGHT UPPER QUADRANT  COMPARISON:  04/27/2010 FINDINGS: Gallbladder: 1.9 cm stone identified within the gallbladder neck. The gallbladder wall appears mildly thickened measuring 3.3 mm. Negative sonographic Murphy's sign. Common bile duct: Diameter: 4.1 Liver: No focal lesion identified. Within normal limits in parenchymal echogenicity. IMPRESSION: 1. Gallstones and mild gallbladder wall thickening. Negative sonographic Murphy's sign. Cannot rule out early  acute cholecystitis. If further imaging is clinically indicated consider nuclear medicine hepatobiliary scan. Electronically Signed   By: Kerby Moors M.D.   On: 12/15/2016 11:08   Ct Angio Abd/pel W And/or Wo Contrast  Result Date: 12/15/2016 CLINICAL DATA:  Abdominal pain.  Back pain.  Symptoms today. EXAM: CTA ABDOMEN AND PELVIS wITHOUT AND WITH CONTRAST TECHNIQUE: Multidetector CT imaging of the abdomen and pelvis was performed using the standard protocol during bolus administration of intravenous contrast. Multiplanar reconstructed images and MIPs were obtained and reviewed to evaluate the vascular anatomy. CONTRAST:  100 cc Isovue 370 COMPARISON:  04/27/2010 FINDINGS: VASCULAR Aorta: Aorta is non aneurysmal and patent. Minimal calcified and irregular plaque in the infrarenal aorta. Celiac: Minimal atherosclerotic changes at origin. No significant narrowing. SMA: Patent. Renals: Single renal arteries are patent. IMA: Patent. Inflow: Mild atherosclerotic calcifications of the iliac arteries. There is some irregular plaque involving the distal half of the right common iliac artery. There are web like areas of narrowing and some atherosclerotic changes. All of these findings may be purely due to atherosclerosis, however fibromuscular dysplasia superimposed upon atherosclerotic disease cannot be excluded. Minimal atherosclerotic calcification of the left common iliac artery without narrowing. External iliac arteries are widely patent. There is mild narrowing at the origin of the right internal iliac artery. Left internal iliac artery is patent. Proximal Outflow: Visualize femoral arteries are grossly patent. Review of the MIP images confirms the above findings. NON-VASCULAR Lower chest: Dependent atelectasis. Hepatobiliary: Large solitary gallstone is present. Liver is unremarkable in appearance. Pancreas: Unremarkable Spleen: Unremarkable Adrenals/Urinary Tract: There are mild chronic changes involving the  kidneys but no obvious mass or hydronephrosis. Adrenal glands are unremarkable. Bladder is unremarkable. Stomach/Bowel: Moderate stool burden throughout the length of the colon is present. There is no obvious mass in the colon. No evidence of small-bowel obstruction. Stomach is unremarkable. Lymphatic: No abnormal retroperitoneal adenopathy. Reproductive: Unremarkable appearance of the prostate gland. Other: No free-fluid. Musculoskeletal: There is no vertebral compression deformity. No obvious spinal stenosis. IMPRESSION: VASCULAR There are atherosclerotic changes within the abdomen and pelvis but no significant focal area of narrowing. There are changes in the right common iliac artery which may be purely related to atherosclerosis, however fibromuscular dysplasia superimposed upon atherosclerosis cannot be excluded. NON-VASCULAR Cholelithiasis. Prominent stool throughout the colon. Electronically Signed   By: Marybelle Killings M.D.   On: 12/15/2016 11:58    Anti-infectives: Anti-infectives    Start     Dose/Rate Route Frequency Ordered Stop   12/15/16 1400  ciprofloxacin (CIPRO) IVPB 400 mg     400 mg 200 mL/hr over 60 Minutes Intravenous Every 12 hours 12/15/16 1346         Assessment/Plan Acute Cholecystitis - S/P LAPAROSCOPIC CHOLECYSTECTOMY WITH INTRAOPERATIVE CHOLANGIOGRAM, Dr. Kieth Brightly, 12/16/16  FEN: reg diet VTE: SCD's ID: Cipro 5/7>>  DISPO: Pt is clear for discharge from a surgical standpoint    LOS: 0 days    Kalman Drape , [facility] 12/17/2016, 7:30 AM Pager: [phone] Consults: [phone] Mon-Fri 7:00 am-4:30 pm Sat-Sun 7:00 am-11:30 am

## 2017-01-12 NOTE — Anesthesia Postprocedure Evaluation (Signed)
Anesthesia Post Note  Patient: Randall Moore  Procedure(s) Performed: Procedure(s) (LRB): LAPAROSCOPIC CHOLECYSTECTOMY WITH INTRAOPERATIVE CHOLANGIOGRAM (N/A)     Anesthesia Post Evaluation  Last Vitals:  Vitals:   12/17/16 0839 12/17/16 0926  BP: 135/68 125/63  Pulse: 73 76  Resp:  15  Temp:  37.2 C    Last Pain:  Vitals:   12/17/16 1030  TempSrc:   PainSc: 0-No pain                 Annya Lizana S

## 2017-01-12 NOTE — Addendum Note (Signed)
Addendum  created 01/12/17 1427 by Myrtie Soman, MD   Sign clinical note

## 2017-12-14 ENCOUNTER — Emergency Department (HOSPITAL_COMMUNITY): Payer: Medicare Other

## 2017-12-14 ENCOUNTER — Other Ambulatory Visit: Payer: Self-pay

## 2017-12-14 ENCOUNTER — Encounter (HOSPITAL_COMMUNITY): Payer: Self-pay

## 2017-12-14 ENCOUNTER — Inpatient Hospital Stay (HOSPITAL_COMMUNITY)
Admission: EM | Admit: 2017-12-14 | Discharge: 2017-12-16 | DRG: 390 | Disposition: A | Discharge status: Home / Self Care | Payer: Medicare Other | Attending: Internal Medicine | Admitting: Internal Medicine

## 2017-12-14 DIAGNOSIS — Z87442 Personal history of urinary calculi: Secondary | ICD-10-CM

## 2017-12-14 DIAGNOSIS — Z886 Allergy status to analgesic agent status: Secondary | ICD-10-CM

## 2017-12-14 DIAGNOSIS — I10 Essential (primary) hypertension: Secondary | ICD-10-CM | POA: Diagnosis present

## 2017-12-14 DIAGNOSIS — Z8349 Family history of other endocrine, nutritional and metabolic diseases: Secondary | ICD-10-CM

## 2017-12-14 DIAGNOSIS — Z7982 Long term (current) use of aspirin: Secondary | ICD-10-CM

## 2017-12-14 DIAGNOSIS — I2510 Atherosclerotic heart disease of native coronary artery without angina pectoris: Secondary | ICD-10-CM | POA: Diagnosis present

## 2017-12-14 DIAGNOSIS — K56600 Partial intestinal obstruction, unspecified as to cause: Secondary | ICD-10-CM | POA: Diagnosis not present

## 2017-12-14 DIAGNOSIS — Z880 Allergy status to penicillin: Secondary | ICD-10-CM

## 2017-12-14 DIAGNOSIS — E785 Hyperlipidemia, unspecified: Secondary | ICD-10-CM | POA: Diagnosis present

## 2017-12-14 DIAGNOSIS — Z91018 Allergy to other foods: Secondary | ICD-10-CM

## 2017-12-14 DIAGNOSIS — Z808 Family history of malignant neoplasm of other organs or systems: Secondary | ICD-10-CM

## 2017-12-14 DIAGNOSIS — R1013 Epigastric pain: Secondary | ICD-10-CM | POA: Diagnosis not present

## 2017-12-14 DIAGNOSIS — Z87891 Personal history of nicotine dependence: Secondary | ICD-10-CM

## 2017-12-14 DIAGNOSIS — Z8249 Family history of ischemic heart disease and other diseases of the circulatory system: Secondary | ICD-10-CM

## 2017-12-14 DIAGNOSIS — K56609 Unspecified intestinal obstruction, unspecified as to partial versus complete obstruction: Secondary | ICD-10-CM

## 2017-12-14 DIAGNOSIS — Z9049 Acquired absence of other specified parts of digestive tract: Secondary | ICD-10-CM

## 2017-12-14 DIAGNOSIS — D72829 Elevated white blood cell count, unspecified: Secondary | ICD-10-CM | POA: Diagnosis present

## 2017-12-14 DIAGNOSIS — K219 Gastro-esophageal reflux disease without esophagitis: Secondary | ICD-10-CM | POA: Diagnosis present

## 2017-12-14 DIAGNOSIS — Z91010 Allergy to peanuts: Secondary | ICD-10-CM

## 2017-12-14 LAB — CBC
HCT: 46.8 % (ref 39.0–52.0)
Hemoglobin: 17 g/dL (ref 13.0–17.0)
MCH: 33.6 pg (ref 26.0–34.0)
MCHC: 36.3 g/dL — AB (ref 30.0–36.0)
MCV: 92.5 fL (ref 78.0–100.0)
PLATELETS: 166 10*3/uL (ref 150–400)
RBC: 5.06 MIL/uL (ref 4.22–5.81)
RDW: 12.5 % (ref 11.5–15.5)
WBC: 11.7 10*3/uL — ABNORMAL HIGH (ref 4.0–10.5)

## 2017-12-14 LAB — COMPREHENSIVE METABOLIC PANEL
ALBUMIN: 4.3 g/dL (ref 3.5–5.0)
ALK PHOS: 95 U/L (ref 38–126)
ALT: 29 U/L (ref 17–63)
AST: 24 U/L (ref 15–41)
Anion gap: 10 (ref 5–15)
BILIRUBIN TOTAL: 1.3 mg/dL — AB (ref 0.3–1.2)
BUN: 16 mg/dL (ref 6–20)
CALCIUM: 9.2 mg/dL (ref 8.9–10.3)
CO2: 23 mmol/L (ref 22–32)
CREATININE: 1.07 mg/dL (ref 0.61–1.24)
Chloride: 109 mmol/L (ref 101–111)
GFR calc Af Amer: >60 mL/min (ref >60)
GFR calc non Af Amer: >60 mL/min (ref >60)
GLUCOSE: 108 mg/dL — AB (ref 65–99)
Potassium: 3.9 mmol/L (ref 3.5–5.1)
Sodium: 142 mmol/L (ref 135–145)
Total Protein: 7.2 g/dL (ref 6.5–8.1)

## 2017-12-14 LAB — TYPE AND SCREEN
ABO/RH(D): O NEG
Antibody Screen: NEGATIVE

## 2017-12-14 MED ORDER — IOPAMIDOL (ISOVUE-300) INJECTION 61%
100.0000 mL | Freq: Once | INTRAVENOUS | Status: AC | PRN
  Administered 2017-12-14: 100 mL via INTRAVENOUS

## 2017-12-14 MED ORDER — IOPAMIDOL (ISOVUE-300) INJECTION 61%
INTRAVENOUS | Status: AC
  Filled 2017-12-14: qty 100

## 2017-12-14 NOTE — ED Triage Notes (Signed)
Patient c/o mid abdominal pain, N/V/D since this AM. Patient states emesis and diarrhea are coffee-colored.

## 2017-12-14 NOTE — ED Provider Notes (Signed)
Medical screening examination/treatment/procedure(s) were conducted as a shared visit with non-physician practitioner(s) and myself.  I personally evaluated the patient during the encounter.  None Reports he started getting some reflux symptoms last night.  He had eaten chicken casserole and birthday cake yesterday.  All family members ate the same thing none are sick.  This morning after he awakened he had sudden onset of copious vomiting and diarrhea.  Around noon things seem to be improving but by 4:00 it came back again.  Reports he was having some abdominal distention and discomfort.  No fever.  At this time abdomen is slightly uncomfortable but not severely painful.  Is alert and appropriate.  Nontoxic.  Heart regular.  Positive bowel sounds.  Abdomen mildly tender in the epigastrium.  Slightly distended.  No guarding.  Lower extremities no peripheral edema.  Skin warm and dry.  Neurologic alert and appropriate with all movements coordinated.  I agree with plan of management.   Charlesetta Shanks, MD 12/14/17 2255

## 2017-12-14 NOTE — ED Triage Notes (Signed)
Per EMS-states abdominal pain, vomiting and diarrhea since this am-patient reports coffee grounds emesis--4 mg of Zofran given in route
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
## 2017-12-15 DIAGNOSIS — Z886 Allergy status to analgesic agent status: Secondary | ICD-10-CM | POA: Diagnosis not present

## 2017-12-15 DIAGNOSIS — K56600 Partial intestinal obstruction, unspecified as to cause: Secondary | ICD-10-CM | POA: Diagnosis present

## 2017-12-15 DIAGNOSIS — K219 Gastro-esophageal reflux disease without esophagitis: Secondary | ICD-10-CM | POA: Diagnosis present

## 2017-12-15 DIAGNOSIS — Z87442 Personal history of urinary calculi: Secondary | ICD-10-CM | POA: Diagnosis not present

## 2017-12-15 DIAGNOSIS — I2510 Atherosclerotic heart disease of native coronary artery without angina pectoris: Secondary | ICD-10-CM | POA: Diagnosis present

## 2017-12-15 DIAGNOSIS — D72829 Elevated white blood cell count, unspecified: Secondary | ICD-10-CM | POA: Diagnosis not present

## 2017-12-15 DIAGNOSIS — Z87891 Personal history of nicotine dependence: Secondary | ICD-10-CM | POA: Diagnosis not present

## 2017-12-15 DIAGNOSIS — R1013 Epigastric pain: Secondary | ICD-10-CM | POA: Diagnosis present

## 2017-12-15 DIAGNOSIS — Z91018 Allergy to other foods: Secondary | ICD-10-CM | POA: Diagnosis not present

## 2017-12-15 DIAGNOSIS — Z9049 Acquired absence of other specified parts of digestive tract: Secondary | ICD-10-CM | POA: Diagnosis not present

## 2017-12-15 DIAGNOSIS — Z880 Allergy status to penicillin: Secondary | ICD-10-CM | POA: Diagnosis not present

## 2017-12-15 DIAGNOSIS — E785 Hyperlipidemia, unspecified: Secondary | ICD-10-CM | POA: Diagnosis present

## 2017-12-15 DIAGNOSIS — I10 Essential (primary) hypertension: Secondary | ICD-10-CM | POA: Diagnosis present

## 2017-12-15 DIAGNOSIS — Z8349 Family history of other endocrine, nutritional and metabolic diseases: Secondary | ICD-10-CM | POA: Diagnosis not present

## 2017-12-15 DIAGNOSIS — Z8249 Family history of ischemic heart disease and other diseases of the circulatory system: Secondary | ICD-10-CM | POA: Diagnosis not present

## 2017-12-15 DIAGNOSIS — Z808 Family history of malignant neoplasm of other organs or systems: Secondary | ICD-10-CM | POA: Diagnosis not present

## 2017-12-15 DIAGNOSIS — Z7982 Long term (current) use of aspirin: Secondary | ICD-10-CM | POA: Diagnosis not present

## 2017-12-15 DIAGNOSIS — Z91010 Allergy to peanuts: Secondary | ICD-10-CM | POA: Diagnosis not present

## 2017-12-15 LAB — CBC
HCT: 41.6 % (ref 39.0–52.0)
Hemoglobin: 14.6 g/dL (ref 13.0–17.0)
MCH: 32.9 pg (ref 26.0–34.0)
MCHC: 35.1 g/dL (ref 30.0–36.0)
MCV: 93.7 fL (ref 78.0–100.0)
PLATELETS: 143 10*3/uL — AB (ref 150–400)
RBC: 4.44 MIL/uL (ref 4.22–5.81)
RDW: 12.7 % (ref 11.5–15.5)
WBC: 8.8 10*3/uL (ref 4.0–10.5)

## 2017-12-15 LAB — BASIC METABOLIC PANEL
ANION GAP: 7 (ref 5–15)
BUN: 15 mg/dL (ref 6–20)
CALCIUM: 8.4 mg/dL — AB (ref 8.9–10.3)
CO2: 25 mmol/L (ref 22–32)
Chloride: 109 mmol/L (ref 101–111)
Creatinine, Ser: 1.17 mg/dL (ref 0.61–1.24)
GFR calc Af Amer: >60 mL/min (ref >60)
GLUCOSE: 109 mg/dL — AB (ref 65–99)
Potassium: 4.1 mmol/L (ref 3.5–5.1)
SODIUM: 141 mmol/L (ref 135–145)

## 2017-12-15 LAB — ABO/RH: ABO/RH(D): O NEG

## 2017-12-15 MED ORDER — CIPROFLOXACIN IN D5W 400 MG/200ML IV SOLN
400.0000 mg | Freq: Two times a day (BID) | INTRAVENOUS | Status: DC
  Administered 2017-12-15: 400 mg via INTRAVENOUS
  Filled 2017-12-15: qty 200

## 2017-12-15 MED ORDER — SODIUM CHLORIDE 0.9 % IV BOLUS
1000.0000 mL | Freq: Once | INTRAVENOUS | Status: AC
  Administered 2017-12-15: 1000 mL via INTRAVENOUS

## 2017-12-15 MED ORDER — ONDANSETRON HCL 4 MG/2ML IJ SOLN
4.0000 mg | Freq: Four times a day (QID) | INTRAMUSCULAR | Status: DC | PRN
  Administered 2017-12-15: 4 mg via INTRAVENOUS
  Filled 2017-12-15: qty 2

## 2017-12-15 MED ORDER — SODIUM CHLORIDE 0.9 % IV SOLN
INTRAVENOUS | Status: DC
  Administered 2017-12-15: 02:00:00 via INTRAVENOUS

## 2017-12-15 MED ORDER — FAMOTIDINE IN NACL 20-0.9 MG/50ML-% IV SOLN
20.0000 mg | Freq: Two times a day (BID) | INTRAVENOUS | Status: DC
  Administered 2017-12-15 (×3): 20 mg via INTRAVENOUS
  Filled 2017-12-15 (×4): qty 50

## 2017-12-15 MED ORDER — ACETAMINOPHEN 325 MG PO TABS: 650.0000 mg | ORAL_TABLET | Freq: Four times a day (QID) | ORAL | Status: DC | PRN

## 2017-12-15 MED ORDER — ENOXAPARIN SODIUM 40 MG/0.4ML ~~LOC~~ SOLN
40.0000 mg | SUBCUTANEOUS | Status: DC
  Administered 2017-12-15: 40 mg via SUBCUTANEOUS
  Filled 2017-12-15 (×2): qty 0.4

## 2017-12-15 MED ORDER — FENTANYL CITRATE (PF) 100 MCG/2ML IJ SOLN
12.5000 ug | INTRAMUSCULAR | Status: DC | PRN
  Administered 2017-12-15: 12.5 ug via INTRAVENOUS
  Filled 2017-12-15: qty 2

## 2017-12-15 MED ORDER — METRONIDAZOLE IN NACL 5-0.79 MG/ML-% IV SOLN
500.0000 mg | Freq: Three times a day (TID) | INTRAVENOUS | Status: DC
  Administered 2017-12-15 – 2017-12-16 (×3): 500 mg via INTRAVENOUS
  Filled 2017-12-15 (×4): qty 100

## 2017-12-15 MED ORDER — DIPHENHYDRAMINE HCL 50 MG/ML IJ SOLN
12.5000 mg | Freq: Four times a day (QID) | INTRAMUSCULAR | Status: DC | PRN
  Administered 2017-12-15: 12.5 mg via INTRAVENOUS
  Filled 2017-12-15: qty 1

## 2017-12-15 MED ORDER — METRONIDAZOLE IN NACL 5-0.79 MG/ML-% IV SOLN
500.0000 mg | Freq: Once | INTRAVENOUS | Status: AC
Start: 2017-12-15 — End: 2017-12-15
  Administered 2017-12-15: 500 mg via INTRAVENOUS
  Filled 2017-12-15: qty 100

## 2017-12-15 MED ORDER — CIPROFLOXACIN IN D5W 400 MG/200ML IV SOLN
400.0000 mg | Freq: Once | INTRAVENOUS | Status: AC
  Administered 2017-12-15: 400 mg via INTRAVENOUS
  Filled 2017-12-15: qty 200

## 2017-12-15 MED ORDER — ACETAMINOPHEN 650 MG RE SUPP: 650.0000 mg | Freq: Four times a day (QID) | RECTAL | Status: DC | PRN

## 2017-12-15 MED ORDER — ONDANSETRON HCL 4 MG PO TABS: 4.0000 mg | ORAL_TABLET | Freq: Four times a day (QID) | ORAL | Status: DC | PRN

## 2017-12-15 MED ORDER — ALBUTEROL SULFATE (2.5 MG/3ML) 0.083% IN NEBU: 2.5000 mg | INHALATION_SOLUTION | Freq: Four times a day (QID) | RESPIRATORY_TRACT | Status: DC | PRN
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
## 2017-12-15 NOTE — Progress Notes (Signed)
[facility]   Patient admitted after midnight by my partner Dr Tamala Julian. See H&P fo full details   72 y/o M with MHx HTN, CAD nephrolithiasis, s/p lap chole in 12/2016 , presented to ED with abdominal pain, N/V and episodic diarrhea. CT abd shows possible inflammatory stricture contributing to partial SBO. Patient was admitted with working diagnosis of partial SBO. General surgery consulted recommended conservative management.   Patient doing well, passing flatus, no n/v. No BM yet. Advance diet to clears, if tolerated advance to full in AM. Check abd xray in AM. Encourage ambulation. Continue supportive tx   Chipper Oman, MD
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
## 2017-12-16 ENCOUNTER — Inpatient Hospital Stay (HOSPITAL_COMMUNITY): Payer: Medicare Other

## 2017-12-16 DIAGNOSIS — I10 Essential (primary) hypertension: Secondary | ICD-10-CM

## 2017-12-16 DIAGNOSIS — D72829 Elevated white blood cell count, unspecified: Secondary | ICD-10-CM

## 2017-12-16 DIAGNOSIS — K56600 Partial intestinal obstruction, unspecified as to cause: Principal | ICD-10-CM

## 2017-12-16 LAB — BASIC METABOLIC PANEL
Anion gap: 7 (ref 5–15)
BUN: 11 mg/dL (ref 6–20)
CALCIUM: 8.3 mg/dL — AB (ref 8.9–10.3)
CO2: 26 mmol/L (ref 22–32)
CREATININE: 1.19 mg/dL (ref 0.61–1.24)
Chloride: 110 mmol/L (ref 101–111)
GFR calc Af Amer: >60 mL/min (ref >60)
GFR calc non Af Amer: >60 mL/min (ref >60)
Glucose, Bld: 93 mg/dL (ref 65–99)
Potassium: 4.4 mmol/L (ref 3.5–5.1)
SODIUM: 143 mmol/L (ref 135–145)

## 2017-12-16 LAB — CBC
HCT: 40.3 % (ref 39.0–52.0)
Hemoglobin: 13.8 g/dL (ref 13.0–17.0)
MCH: 32.2 pg (ref 26.0–34.0)
MCHC: 34.2 g/dL (ref 30.0–36.0)
MCV: 93.9 fL (ref 78.0–100.0)
PLATELETS: 137 10*3/uL — AB (ref 150–400)
RBC: 4.29 MIL/uL (ref 4.22–5.81)
RDW: 12.6 % (ref 11.5–15.5)
WBC: 4.6 10*3/uL (ref 4.0–10.5)

## 2017-12-16 LAB — MAGNESIUM: MAGNESIUM: 1.6 mg/dL — AB (ref 1.7–2.4)

## 2017-12-16 MED ORDER — OMEPRAZOLE 40 MG PO CPDR: 40.0000 mg | DELAYED_RELEASE_CAPSULE | Freq: Every day | ORAL | 3 refills | Status: DC

## 2017-12-16 MED ORDER — METRONIDAZOLE 500 MG PO TABS: 500.0000 mg | ORAL_TABLET | Freq: Three times a day (TID) | ORAL | 0 refills | Status: AC

## 2017-12-16 MED ORDER — CIPROFLOXACIN HCL 500 MG PO TABS: 500.0000 mg | ORAL_TABLET | Freq: Two times a day (BID) | ORAL | 0 refills | Status: AC

## 2017-12-16 NOTE — Progress Notes (Signed)
Pt discharged home with all belongings. Discharge education completed. Cipro ordered verified by MD.

## 2017-12-16 NOTE — Progress Notes (Signed)
Pt had possible reaction to cipro yesterday, written in discharge orders. MD paged to clarify.

## 2017-12-16 NOTE — Discharge Summary (Signed)
Physician Discharge Summary  Randall Moore XNA:[id number] DOB: 05-15-1946 DOA: 12/14/2017  PCP: Christain Sacramento, MD  Admit date: 12/14/2017 Discharge date: 12/16/2017  Admitted From: home Disposition:  Home  Recommendations for Outpatient Follow-up:  1. Follow up with PCP in 1-2 weeks 2. Please obtain BMP/CBC in one week   Home Health:No  Equipment/Devices:None  Discharge Condition:stable CODE STATUS:full Diet recommendation: Heart Healthy   Brief/Interim Summary: 72 y.o. male past medical history significant of hypertension nephrolithiasis status post cholecystectomy in 12/2016 who presents with abdominal pain nausea and vomiting was found to have a small bowel obstruction  Discharge Diagnoses:  Principal Problem:   Partial small bowel obstruction ([place]) Active Problems:   Leukocytosis   Essential hypertension  Partial small bowel obstruction: He was treated conservatively was placed n.p.o. IV fluid hydration surgery was consulted.  He was treated conservatively, and by the next day his ileus was resolved.  Possible colitis: He was started empirically on IV Cipro and Flagyl he will continue to 3-10 additional days an outpatient his abdominal pain is improved.  Leukocytosis: Now resolved.  Essential hypertension: No changes were made.   Discharge Instructions  Discharge Instructions    Diet - low sodium heart healthy   Complete by:  As directed    Increase activity slowly   Complete by:  As directed      Allergies as of 12/16/2017      Reactions   Ibuprofen Hives, Swelling   tongue and lips swell   Peanut-containing Drug Products Hives   Penicillins Hives, Swelling   tongue and lips swell, Has patient had a PCN reaction causing immediate rash, facial/tongue/throat swelling, SOB or lightheadedness with hypotension: Yes Has patient had a PCN reaction causing severe rash involving mucus membranes or skin necrosis: No Has patient had a PCN reaction that required  hospitalization No Has patient had a PCN reaction occurring within the last 10 years: No If all of the above answers are "NO", then may proceed with Cephalosporin use.   Tomato Hives      Medication List    STOP taking these medications   desloratadine 5 MG tablet Commonly known as:  CLARINEX   fish oil-omega-3 fatty acids 1000 MG capsule   MULTI-VITAMINS Tabs     TAKE these medications   amLODipine 5 MG tablet Commonly known as:  NORVASC Take 5 mg by mouth at bedtime.   aspirin EC 81 MG tablet Take 81 mg by mouth every morning.   atorvastatin 80 MG tablet Commonly known as:  LIPITOR Take 80 mg by mouth every evening.   CALCIUM PO Take 600 mg by mouth daily.   carvedilol 3.125 MG tablet Commonly known as:  COREG Take 1 tablet by mouth daily.   ciprofloxacin 500 MG tablet Commonly known as:  CIPRO Take 1 tablet (500 mg total) by mouth 2 (two) times daily for 10 days.   clobetasol ointment 0.05 % Commonly known as:  TEMOVATE Apply 1 application topically 3 (three) times daily as needed (for rash or itching).   docusate sodium 100 MG capsule Commonly known as:  COLACE Take 100 mg by mouth daily.   ferrous sulfate 325 (65 FE) MG tablet Take 325 mg by mouth every other day. In the morning   HYDROcodone-acetaminophen 5-325 MG tablet Commonly known as:  NORCO/VICODIN Take 1 tablet by mouth every 6 (six) hours as needed for moderate pain.   metroNIDAZOLE 500 MG tablet Commonly known as:  FLAGYL Take 1 tablet (500 mg  total) by mouth 3 (three) times daily for 10 days.   nitroGLYCERIN 0.4 MG SL tablet Commonly known as:  NITROSTAT Place 0.4 mg under the tongue every 5 (five) minutes as needed for chest pain.   omeprazole 40 MG capsule Commonly known as:  PRILOSEC Take 1 capsule (40 mg total) by mouth daily.   RA SAW PALMETTO 80 MG capsule Generic drug:  saw palmetto Take 320 mg by mouth daily.       Allergies  Allergen Reactions  . Ibuprofen Hives and  Swelling    tongue and lips swell  . Peanut-Containing Drug Products Hives  . Penicillins Hives and Swelling    tongue and lips swell, Has patient had a PCN reaction causing immediate rash, facial/tongue/throat swelling, SOB or lightheadedness with hypotension: Yes Has patient had a PCN reaction causing severe rash involving mucus membranes or skin necrosis: No Has patient had a PCN reaction that required hospitalization No Has patient had a PCN reaction occurring within the last 10 years: No If all of the above answers are "NO", then may proceed with Cephalosporin use.   . Tomato Hives    Consultations:  Surgery   Procedures/Studies: Ct Abdomen Pelvis W Contrast  Result Date: 12/14/2017 CLINICAL DATA:  Mid abdominal pain, nausea, vomiting and diarrhea since this morning. Emesis. EXAM: CT ABDOMEN AND PELVIS WITH CONTRAST TECHNIQUE: Multidetector CT imaging of the abdomen and pelvis was performed using the standard protocol following bolus administration of intravenous contrast. CONTRAST:  179mL ISOVUE-300 IOPAMIDOL (ISOVUE-300) INJECTION 61% COMPARISON:  12/15/2016 FINDINGS: Lower chest: Heart size is normal without pericardial effusion. Clear lung bases. Hepatobiliary: Cholecystectomy. Homogeneous enhancement of the liver without space-occupying mass. No biliary dilatation is identified. Pancreas: Mild fatty atrophy of the pancreas. No ductal dilatation, mass or inflammation. Spleen: Normal Adrenals/Urinary Tract: Normal bilateral adrenal glands. Punctate calcification in the upper pole the left kidney may reflect a nonobstructing calculus versus vascular calcification. Three and four mm hypodensities in the lower pole of the left kidney are too small to further characterize but are statistically consistent with a cysts, the 3 mm hypodensity is stable since prior. The 4 mm hypodensity appears new. No hydroureteronephrosis. The urinary bladder is decompressed. Stomach/Bowel: Fluid-filled  distention of the stomach with normal small bowel rotation. The proximal jejunum is decompressed. Fluid-filled distention of mid jejunum through the ileum is identified with 5 cm segmental luminal narrowing noted of proximal ileum, series 2/72 with mucosal enhancement. 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 This is causing more proximal small bowel dilatation up to 3 cm in diameter some of which demonstrate fecalized matter within. The distal and terminal ileum are nondistended. The colon is unremarkable. Vascular/Lymphatic: Aortoiliac atherosclerosis.  No lymphadenopathy. Reproductive: Top-normal size prostate with central zone calcifications. Other: No abdominal wall hernia or abnormality. No abdominopelvic ascites. Musculoskeletal: Osteoarthritis of the left SI joint. Multilevel degenerative facet arthropathy. No acute osseous abnormality. IMPRESSION: 1. Fluid-filled distention of small bowel up to 3 cm in diameter from approximately mid jejunum through the distal ileum. A 5 cm long segmental area of small bowel luminal narrowing with mucosal enhancement is seen possibly representing an inflammatory stricture, series 2/72 and series 5/29. Findings are likely contributing to a partial SBO. 2. Left lower pole renal hypodensity statistically consistent with cysts, ranging in size from 3-4 mm. These are too small to further characterize. 3. Aortoiliac atherosclerosis. Electronically Signed   By: Ashley Royalty M.D.   On: 12/14/2017 22:56  Dg Abd 2 Views  Result Date: 12/16/2017 CLINICAL DATA:  Follow-up of small bowel obstruction. No residual abdominal pain or nausea. EXAM: ABDOMEN - 2 VIEW COMPARISON:  CT scan of the abdomen and pelvis of Dec 14, 2017 FINDINGS: The bowel gas pattern is nearly normal. There are several loops of minimally distended gas-filled small bowel in the right mid to lower abdomen. The left colonic and rectosigmoid stool burden is increased. There  is no free extraluminal gas. There are no abnormal soft tissue calcifications. There is gentle dextrocurvature of the thoracolumbar spine. IMPRESSION: Improved appearance of the bowel gas pattern suggests and of near total resolution of the previously demonstrated small bowel obstruction. A few loops of minimally distended small bowel remain. Moderately increased colonic stool burden may reflect constipation in the appropriate clinical setting. Electronically Signed   By: David  Martinique M.D.   On: 12/16/2017 10:29   Dg Abd Acute W/chest  Result Date: 12/14/2017 CLINICAL DATA:  72 year old male with abdominal pain, vomiting and diarrhea EXAM: DG ABDOMEN ACUTE W/ 1V CHEST COMPARISON:  Prior CT scan of the abdomen and pelvis 12/15/2016 FINDINGS: Cardiac and mediastinal contours are within normal limits. Nonspecific patchy left retrocardiac airspace opacity. No pleural effusion or pneumothorax. Multiple air-fluid levels present throughout the right hemiabdomen on the upright radiograph. Additionally, there are several air-filled loops of small bowel in the mid abdomen. No evidence of free air. Gas is present in the descending colon. No acute osseous abnormality. IMPRESSION: 1. Findings are concerning for at least partial small bowel obstruction. Recommend further evaluation with CT scan of the abdomen and pelvis including both oral and intravenous contrast. 2. Nonspecific patchy opacity in the left retrocardiac region may represent atelectasis or infiltrate. Electronically Signed   By: Jacqulynn Cadet M.D.   On: 12/14/2017 21:14      Subjective: No complaints feels great tolerating his diet.  Discharge Exam: Vitals:   12/15/17 2124 12/16/17 0536  BP: 132/77 124/69  Pulse: (!) 58 60  Resp:  18  Temp: 98 F (36.7 C) 97.9 F (36.6 C)  SpO2: 99% 97%   Vitals:   12/15/17 0658 12/15/17 1312 12/15/17 2124 12/16/17 0536  BP: 117/70 111/64 132/77 124/69  Pulse: 66 65 (!) 58 60  Resp: 16 14  18   Temp: 98  F (36.7 C) 98 F (36.7 C) 98 F (36.7 C) 97.9 F (36.6 C)  TempSrc: Oral Oral Oral Oral  SpO2: 97% 98% 99% 97%  Weight:      Height:        General: Pt is alert, awake, not in acute distress Cardiovascular: RRR, S1/S2 +, no rubs, no gallops Respiratory: CTA bilaterally, no wheezing, no rhonchi Abdominal: Soft, NT, ND, bowel sounds + Extremities: no edema, no cyanosis    The results of significant diagnostics from this hospitalization (including imaging, microbiology, ancillary and laboratory) are listed below for reference.     Microbiology: No results found for this or any previous visit (from the past 240 hour(s)).   Labs: BNP (last 3 results) No results for input(s): BNP in the last 8760 hours. Basic Metabolic Panel: Recent Labs  Lab 12/14/17 2012 12/15/17 0424 12/16/17 0426  NA 142 141 143  K 3.9 4.1 4.4  CL 109 109 110  CO2 23 25 26   GLUCOSE 108* 109* 93  BUN 16 15 11   CREATININE 1.07 1.17 1.19  CALCIUM 9.2 8.4* 8.3*  MG  --   --  1.6*   Liver Function Tests: Recent  Labs  Lab 12/14/17 2012  AST 24  ALT 29  ALKPHOS 95  BILITOT 1.3*  PROT 7.2  ALBUMIN 4.3   No results for input(s): LIPASE, AMYLASE in the last 168 hours. No results for input(s): AMMONIA in the last 168 hours. CBC: Recent Labs  Lab 12/14/17 2012 12/15/17 0424 12/16/17 0426  WBC 11.7* 8.8 4.6  HGB 17.0 14.6 13.8  HCT 46.8 41.6 40.3  MCV 92.5 93.7 93.9  PLT 166 143* 137*   Cardiac Enzymes: No results for input(s): CKTOTAL, CKMB, CKMBINDEX, TROPONINI in the last 168 hours. BNP: Invalid input(s): POCBNP CBG: No results for input(s): GLUCAP in the last 168 hours. D-Dimer No results for input(s): DDIMER in the last 72 hours. Hgb A1c No results for input(s): HGBA1C in the last 72 hours. Lipid Profile No results for input(s): CHOL, HDL, LDLCALC, TRIG, CHOLHDL, LDLDIRECT in the last 72 hours. Thyroid function studies No results for input(s): TSH, T4TOTAL, T3FREE, THYROIDAB in  the last 72 hours.  Invalid input(s): FREET3 Anemia work up No results for input(s): VITAMINB12, FOLATE, FERRITIN, TIBC, IRON, RETICCTPCT in the last 72 hours. Urinalysis    Component Value Date/Time   COLORURINE RED BIOCHEMICALS MAY BE AFFECTED BY COLOR (A) 04/27/2010 0655   APPEARANCEUR TURBID (A) 04/27/2010 0655   LABSPEC 1.024 04/27/2010 0655   PHURINE 6.5 04/27/2010 0655   GLUCOSEU NEGATIVE 04/27/2010 0655   HGBUR LARGE (A) 04/27/2010 0655   BILIRUBINUR NEGATIVE 04/27/2010 0655   KETONESUR 15 (A) 04/27/2010 0655   PROTEINUR >300 (A) 04/27/2010 0655   UROBILINOGEN 1.0 04/27/2010 0655   NITRITE POSITIVE (A) 04/27/2010 0655   LEUKOCYTESUR MODERATE (A) 04/27/2010 0655   Sepsis Labs Invalid input(s): PROCALCITONIN,  WBC,  LACTICIDVEN Microbiology No results found for this or any previous visit (from the past 240 hour(s)).   Time coordinating discharge: Over 30 minutes  SIGNED:   Charlynne Cousins, MD  [facility] 12/16/2017, 11:25 AM Pager   If 7PM-7AM, please contact night-coverage [email or web address] Password [email or web address]

## 2017-12-16 NOTE — Final Consult Note (Signed)
[facility] Progress Note     Subjective: CC: wants to go home Feels pretty much back to normal. Denies abdominal pain, distention, nausea. No BM but passing flatus, walking a lot.  UOP good. VSS.   Objective: Vital signs in last 24 hours: Temp:  [97.9 F (36.6 C)-98 F (36.7 C)] 97.9 F (36.6 C) (05/08 0536) Pulse Rate:  [58-65] 60 (05/08 0536) Resp:  [14-18] 18 (05/08 0536) BP: (111-132)/(64-77) 124/69 (05/08 0536) SpO2:  [97 %-99 %] 97 % (05/08 0536) Last BM Date: 12/14/17  Intake/Output from previous day: 05/07 0701 - 05/08 0700 In: 4265 [P.O.:2040; I.V.:1725; IV Piggyback:500] Out: -  Intake/Output this shift: No intake/output data recorded.  PE: Gen:  Alert, NAD, pleasant Card:  Regular rate and rhythm, pedal pulses 2+ BL Pulm:  Normal effort, clear to auscultation bilaterally Abd: Soft, non-tender, non-distended, bowel sounds present, no HSM Skin: warm and dry, no rashes  Psych: A&Ox3   Lab Results:  Recent Labs    12/15/17 0424 12/16/17 0426  WBC 8.8 4.6  HGB 14.6 13.8  HCT 41.6 40.3  PLT 143* 137*   BMET Recent Labs    12/15/17 0424 12/16/17 0426  NA 141 143  K 4.1 4.4  CL 109 110  CO2 25 26  GLUCOSE 109* 93  BUN 15 11  CREATININE 1.17 1.19  CALCIUM 8.4* 8.3*   PT/INR No results for input(s): LABPROT, INR in the last 72 hours. CMP     Component Value Date/Time   NA 143 12/16/2017 0426   K 4.4 12/16/2017 0426   CL 110 12/16/2017 0426   CO2 26 12/16/2017 0426   GLUCOSE 93 12/16/2017 0426   BUN 11 12/16/2017 0426   CREATININE 1.19 12/16/2017 0426   CALCIUM 8.3 (L) 12/16/2017 0426   PROT 7.2 12/14/2017 2012   ALBUMIN 4.3 12/14/2017 2012   AST 24 12/14/2017 2012   ALT 29 12/14/2017 2012   ALKPHOS 95 12/14/2017 2012   BILITOT 1.3 (H) 12/14/2017 2012   GFRNONAA >60 12/16/2017 0426   GFRAA >60 12/16/2017 0426   Lipase     Component Value Date/Time   LIPASE 16 12/16/2016 0606       Studies/Results: Ct Abdomen  Pelvis W Contrast  Result Date: 12/14/2017 CLINICAL DATA:  Mid abdominal pain, nausea, vomiting and diarrhea since this morning. Emesis. EXAM: CT ABDOMEN AND PELVIS WITH CONTRAST TECHNIQUE: Multidetector CT imaging of the abdomen and pelvis was performed using the standard protocol following bolus administration of intravenous contrast. CONTRAST:  160mL ISOVUE-300 IOPAMIDOL (ISOVUE-300) INJECTION 61% COMPARISON:  12/15/2016 FINDINGS: Lower chest: Heart size is normal without pericardial effusion. Clear lung bases. Hepatobiliary: Cholecystectomy. Homogeneous enhancement of the liver without space-occupying mass. No biliary dilatation is identified. Pancreas: Mild fatty atrophy of the pancreas. No ductal dilatation, mass or inflammation. Spleen: Normal Adrenals/Urinary Tract: Normal bilateral adrenal glands. Punctate calcification in the upper pole the left kidney may reflect a nonobstructing calculus versus vascular calcification. Three and four mm hypodensities in the lower pole of the left kidney are too small to further characterize but are statistically consistent with a cysts, the 3 mm hypodensity is stable since prior. The 4 mm hypodensity appears new. No hydroureteronephrosis. The urinary bladder is decompressed. Stomach/Bowel: Fluid-filled distention of the stomach with normal small bowel rotation. The proximal jejunum is decompressed. Fluid-filled distention of mid jejunum through the ileum is identified with 5 cm segmental luminal narrowing noted of proximal ileum, series 2/72 with mucosal enhancement. 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 This is causing more proximal small bowel dilatation up to 3 cm in diameter some of which demonstrate fecalized matter within. The distal and terminal ileum are nondistended. The colon is unremarkable. Vascular/Lymphatic: Aortoiliac atherosclerosis.  No lymphadenopathy. Reproductive: Top-normal size prostate with central  zone calcifications. Other: No abdominal wall hernia or abnormality. No abdominopelvic ascites. Musculoskeletal: Osteoarthritis of the left SI joint. Multilevel degenerative facet arthropathy. No acute osseous abnormality. IMPRESSION: 1. Fluid-filled distention of small bowel up to 3 cm in diameter from approximately mid jejunum through the distal ileum. A 5 cm long segmental area of small bowel luminal narrowing with mucosal enhancement is seen possibly representing an inflammatory stricture, series 2/72 and series 5/29. Findings are likely contributing to a partial SBO. 2. Left lower pole renal hypodensity statistically consistent with cysts, ranging in size from 3-4 mm. These are too small to further characterize. 3. Aortoiliac atherosclerosis. Electronically Signed   By: Ashley Royalty M.D.   On: 12/14/2017 22:56   Dg Abd Acute W/chest  Result Date: 12/14/2017 CLINICAL DATA:  72 year old male with abdominal pain, vomiting and diarrhea EXAM: DG ABDOMEN ACUTE W/ 1V CHEST COMPARISON:  Prior CT scan of the abdomen and pelvis 12/15/2016 FINDINGS: Cardiac and mediastinal contours are within normal limits. Nonspecific patchy left retrocardiac airspace opacity. No pleural effusion or pneumothorax. Multiple air-fluid levels present throughout the right hemiabdomen on the upright radiograph. Additionally, there are several air-filled loops of small bowel in the mid abdomen. No evidence of free air. Gas is present in the descending colon. No acute osseous abnormality. IMPRESSION: 1. Findings are concerning for at least partial small bowel obstruction. Recommend further evaluation with CT scan of the abdomen and pelvis including both oral and intravenous contrast. 2. Nonspecific patchy opacity in the left retrocardiac region may represent atelectasis or infiltrate. Electronically Signed   By: Jacqulynn Cadet M.D.   On: 12/14/2017 21:14    Anti-infectives: Anti-infectives (From admission, onward)   Start     Dose/Rate  Route Frequency Ordered Stop   12/15/17 1000  ciprofloxacin (CIPRO) IVPB 400 mg  Status:  Discontinued     400 mg 200 mL/hr over 60 Minutes Intravenous Every 12 hours 12/15/17 0132 12/15/17 1154   12/15/17 0800  metroNIDAZOLE (FLAGYL) IVPB 500 mg     500 mg 100 mL/hr over 60 Minutes Intravenous Every 8 hours 12/15/17 0132     12/15/17 0015  ciprofloxacin (CIPRO) IVPB 400 mg     400 mg 200 mL/hr over 60 Minutes Intravenous  Once 12/15/17 0003 12/15/17 0113   12/15/17 0015  metroNIDAZOLE (FLAGYL) IVPB 500 mg     500 mg 100 mL/hr over 60 Minutes Intravenous  Once 12/15/17 0003 12/15/17 0223       Assessment/Plan PSBO - CT: Fluid-filled distention of small bowel up to 3 cm in diameter from approximately mid jejunum through the distal ileum. A 5 cm long segmental area of small bowel luminal narrowing with mucosal enhancement is seen possibly representing an inflammatory stricture Findings are likely contributing to a partial SBO. - abdominal exam benign, pain improved, passing flatus - tolerating CLD - advance to soft diet, stable for d/c if tolerating this from our perspective - KUB this AM with normal bowel gas pattern - no indications for surgical intervention, we will sign off at this time. Please call with questions or concerns, or if new issues arise     LOS: 1 day    [employer] ,  [facility] 12/16/2017, 8:39 AM Pager: [phone] Consults: [phone] Mon-Fri 7:00 am-4:30 pm Sat-Sun 7:00 am-11:30 am

## 2018-08-21 ENCOUNTER — Emergency Department (HOSPITAL_COMMUNITY)
Admission: EM | Admit: 2018-08-21 | Discharge: 2018-08-22 | Disposition: A | Discharge status: Home / Self Care | Payer: 59 | Attending: Emergency Medicine | Admitting: Emergency Medicine

## 2018-08-21 ENCOUNTER — Encounter (HOSPITAL_COMMUNITY): Payer: Self-pay

## 2018-08-21 ENCOUNTER — Other Ambulatory Visit: Payer: Self-pay

## 2018-08-21 DIAGNOSIS — Z7982 Long term (current) use of aspirin: Secondary | ICD-10-CM | POA: Insufficient documentation

## 2018-08-21 DIAGNOSIS — Z79899 Other long term (current) drug therapy: Secondary | ICD-10-CM | POA: Insufficient documentation

## 2018-08-21 DIAGNOSIS — T7840XA Allergy, unspecified, initial encounter: Secondary | ICD-10-CM | POA: Diagnosis not present

## 2018-08-21 DIAGNOSIS — I10 Essential (primary) hypertension: Secondary | ICD-10-CM | POA: Insufficient documentation

## 2018-08-21 DIAGNOSIS — I2510 Atherosclerotic heart disease of native coronary artery without angina pectoris: Secondary | ICD-10-CM | POA: Diagnosis not present

## 2018-08-21 DIAGNOSIS — Z87891 Personal history of nicotine dependence: Secondary | ICD-10-CM | POA: Diagnosis not present

## 2018-08-21 DIAGNOSIS — R21 Rash and other nonspecific skin eruption: Secondary | ICD-10-CM | POA: Diagnosis present

## 2018-08-21 MED ORDER — DIPHENHYDRAMINE HCL 25 MG PO CAPS
25.0000 mg | ORAL_CAPSULE | Freq: Once | ORAL | Status: AC
  Administered 2018-08-21: 25 mg via ORAL
  Filled 2018-08-21: qty 1

## 2018-08-21 MED ORDER — FAMOTIDINE 20 MG PO TABS
20.0000 mg | ORAL_TABLET | Freq: Once | ORAL | Status: AC
  Administered 2018-08-21: 20 mg via ORAL
  Filled 2018-08-21: qty 1

## 2018-08-21 MED ORDER — PREDNISONE 20 MG PO TABS
60.0000 mg | ORAL_TABLET | Freq: Once | ORAL | Status: AC
  Administered 2018-08-21: 60 mg via ORAL
  Filled 2018-08-21: qty 3
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
## 2018-08-21 NOTE — ED Triage Notes (Signed)
Pt reports hives and mouth and tongue swelling starting around 7p. Denies any food triggers, but states that he often reacts to the environment. He took 50mg  benadryl at 735p and used his epipen at 815p.

## 2018-08-22 MED ORDER — FAMOTIDINE 20 MG PO TABS: 20.0000 mg | ORAL_TABLET | Freq: Two times a day (BID) | ORAL | 0 refills | Status: DC

## 2018-08-22 MED ORDER — PREDNISONE 10 MG PO TABS: 20.0000 mg | ORAL_TABLET | Freq: Every day | ORAL | 0 refills | Status: AC

## 2018-08-22 MED ORDER — LORATADINE 10 MG PO TABS
10.0000 mg | ORAL_TABLET | Freq: Once | ORAL | Status: AC
  Administered 2018-08-22: 10 mg via ORAL
  Filled 2018-08-22: qty 1

## 2018-08-22 MED ORDER — METHYLPREDNISOLONE SODIUM SUCC 125 MG IJ SOLR
60.0000 mg | Freq: Once | INTRAMUSCULAR | Status: AC
  Administered 2018-08-22: 60 mg via INTRAMUSCULAR
  Filled 2018-08-22: qty 2

## 2018-08-22 NOTE — Discharge Instructions (Signed)
Thank you for allowing me to care for you today in the Emergency Department.   Starting to tomorrow, take 2 tablets of prednisone daily with breakfast for the next 4 days.  If you develop a rash or concern you are having allergic reaction you can take 1 tablet of Pepcid in addition to Benadryl.  Sometimes taking Benadryl with a medication like Claritin or Zyrtec may increase the effectiveness of the medication.  If you are not having the symptoms and you do not need to take this medication.  Of note, this medication can also help with acid reflux.   Follow-up with your allergist if you continue to have recurrent episodes of hives.  Return to the emergency department if you feel as if your throat is closing, have swelling of your lips, shortness of breath, chest pain, or other new, concerning symptoms.

## 2019-01-28 ENCOUNTER — Other Ambulatory Visit: Payer: Self-pay

## 2019-01-28 ENCOUNTER — Emergency Department (HOSPITAL_COMMUNITY): Payer: 59

## 2019-01-28 ENCOUNTER — Emergency Department (HOSPITAL_COMMUNITY)
Admission: EM | Admit: 2019-01-28 | Discharge: 2019-01-29 | Disposition: A | Discharge status: Home / Self Care | Payer: 59 | Attending: Emergency Medicine | Admitting: Emergency Medicine

## 2019-01-28 ENCOUNTER — Encounter (HOSPITAL_COMMUNITY): Payer: Self-pay

## 2019-01-28 DIAGNOSIS — R1033 Periumbilical pain: Secondary | ICD-10-CM | POA: Diagnosis present

## 2019-01-28 DIAGNOSIS — R1084 Generalized abdominal pain: Secondary | ICD-10-CM | POA: Insufficient documentation

## 2019-01-28 DIAGNOSIS — Z87891 Personal history of nicotine dependence: Secondary | ICD-10-CM | POA: Diagnosis not present

## 2019-01-28 DIAGNOSIS — I2510 Atherosclerotic heart disease of native coronary artery without angina pectoris: Secondary | ICD-10-CM | POA: Insufficient documentation

## 2019-01-28 DIAGNOSIS — R110 Nausea: Secondary | ICD-10-CM

## 2019-01-28 DIAGNOSIS — Z91010 Allergy to peanuts: Secondary | ICD-10-CM | POA: Diagnosis not present

## 2019-01-28 DIAGNOSIS — Z7982 Long term (current) use of aspirin: Secondary | ICD-10-CM | POA: Diagnosis not present

## 2019-01-28 DIAGNOSIS — K5900 Constipation, unspecified: Secondary | ICD-10-CM | POA: Diagnosis not present

## 2019-01-28 DIAGNOSIS — Z79899 Other long term (current) drug therapy: Secondary | ICD-10-CM | POA: Insufficient documentation

## 2019-01-28 LAB — CBC WITH DIFFERENTIAL/PLATELET
Abs Immature Granulocytes: 0.01 10*3/uL (ref 0.00–0.07)
Basophils Absolute: 0 10*3/uL (ref 0.0–0.1)
Basophils Relative: 1 %
Eosinophils Absolute: 0.1 10*3/uL (ref 0.0–0.5)
Eosinophils Relative: 2 %
HCT: 41.9 % (ref 39.0–52.0)
Hemoglobin: 14.8 g/dL (ref 13.0–17.0)
Immature Granulocytes: 0 %
Lymphocytes Relative: 19 %
Lymphs Abs: 1.1 10*3/uL (ref 0.7–4.0)
MCH: 33.3 pg (ref 26.0–34.0)
MCHC: 35.3 g/dL (ref 30.0–36.0)
MCV: 94.2 fL (ref 80.0–100.0)
Monocytes Absolute: 0.7 10*3/uL (ref 0.1–1.0)
Monocytes Relative: 11 %
Neutro Abs: 3.9 10*3/uL (ref 1.7–7.7)
Neutrophils Relative %: 67 %
Platelets: 150 10*3/uL (ref 150–400)
RBC: 4.45 MIL/uL (ref 4.22–5.81)
RDW: 12.2 % (ref 11.5–15.5)
WBC: 5.9 10*3/uL (ref 4.0–10.5)
nRBC: 0 % (ref 0.0–0.2)

## 2019-01-28 LAB — COMPREHENSIVE METABOLIC PANEL
ALT: 26 U/L (ref 0–44)
AST: 21 U/L (ref 15–41)
Albumin: 4.1 g/dL (ref 3.5–5.0)
Alkaline Phosphatase: 93 U/L (ref 38–126)
Anion gap: 4 — ABNORMAL LOW (ref 5–15)
BUN: 17 mg/dL (ref 8–23)
CO2: 30 mmol/L (ref 22–32)
Calcium: 8.6 mg/dL — ABNORMAL LOW (ref 8.9–10.3)
Chloride: 106 mmol/L (ref 98–111)
Creatinine, Ser: 1.08 mg/dL (ref 0.61–1.24)
GFR calc Af Amer: >60 mL/min (ref >60)
GFR calc non Af Amer: >60 mL/min (ref >60)
Glucose, Bld: 121 mg/dL — ABNORMAL HIGH (ref 70–99)
Potassium: 3.7 mmol/L (ref 3.5–5.1)
Sodium: 140 mmol/L (ref 135–145)
Total Bilirubin: 1 mg/dL (ref 0.3–1.2)
Total Protein: 6.2 g/dL — ABNORMAL LOW (ref 6.5–8.1)

## 2019-01-28 LAB — URINALYSIS, ROUTINE W REFLEX MICROSCOPIC
Bilirubin Urine: NEGATIVE
Glucose, UA: NEGATIVE mg/dL
Hgb urine dipstick: NEGATIVE
Ketones, ur: NEGATIVE mg/dL
Leukocytes,Ua: NEGATIVE
Nitrite: NEGATIVE
Protein, ur: NEGATIVE mg/dL
Specific Gravity, Urine: 1.018 (ref 1.005–1.030)
pH: 7 (ref 5.0–8.0)

## 2019-01-28 LAB — LIPASE, BLOOD: Lipase: 25 U/L (ref 11–51)

## 2019-01-28 MED ORDER — IOHEXOL 300 MG/ML  SOLN
100.0000 mL | Freq: Once | INTRAMUSCULAR | Status: AC | PRN
  Administered 2019-01-28: 100 mL via INTRAVENOUS

## 2019-01-28 MED ORDER — ONDANSETRON HCL 4 MG/2ML IJ SOLN
4.0000 mg | Freq: Once | INTRAMUSCULAR | Status: AC
  Administered 2019-01-28: 4 mg via INTRAVENOUS
  Filled 2019-01-28: qty 2

## 2019-01-28 MED ORDER — SODIUM CHLORIDE 0.9 % IV BOLUS
500.0000 mL | Freq: Once | INTRAVENOUS | Status: AC
  Administered 2019-01-28: 500 mL via INTRAVENOUS

## 2019-01-28 NOTE — ED Notes (Signed)
Patient taken to Xray  
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
## 2019-01-29 NOTE — Discharge Instructions (Signed)
Your CT scan is reassuring and does not show an obstruction but does show significant constipation.  Please use 8 capfuls of miralax in 32 oz gatorade and drink over a few hours, after this continue to use 1 capful of miralax daily.  Make sure you are getting appropriate fiber in your diet and drinking plenty of fluids.  Please discontinue opioid pain medications, you can continue muscle relaxers, try over-the-counter lidocaine patches, topical Voltaren gel, ice heat and if back pain is not improving please follow-up with your regular doctor.  Return if you are still unable to have a bowel movement, you have vomiting, worsening abdominal pain, fevers or any other new or concerning symptoms.

## 2020-12-13 IMAGING — CT CT ABDOMEN AND PELVIS WITH CONTRAST
2 of 5 series · 16 of 46 positions shown, 18 images · IV contrast (omnipaque)
Comparison: CT abdomen and pelvis 12/14/2017.

CLINICAL DATA: Mid abdominal pain, worse today. History of
small-bowel obstruction.

EXAM:
CT ABDOMEN AND PELVIS WITH CONTRAST
TECHNIQUE: Multidetector CT imaging of the abdomen and pelvis was performed
using the standard protocol following bolus administration of
intravenous contrast.
CONTRAST:  100 mL OMNIPAQUE IOHEXOL 300 MG/ML  SOLN

[Series 3: axial st · axial · 0.76mm/px · z∈[+1056,+1486]mm · 13 of 100 slices shown, 15 images]
[im 7/100  soft-tissue]
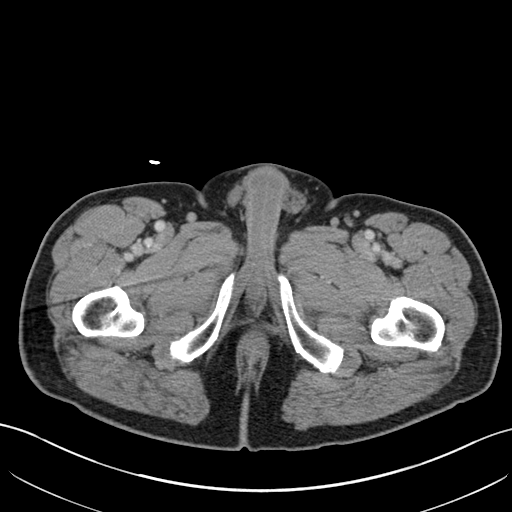
[im 7/100  bone]
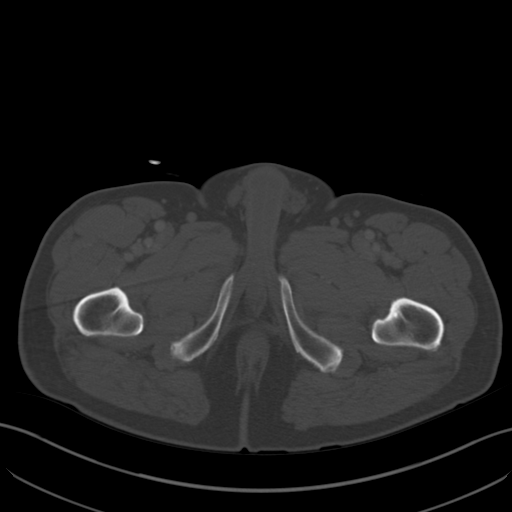
[im 13/100  soft-tissue]
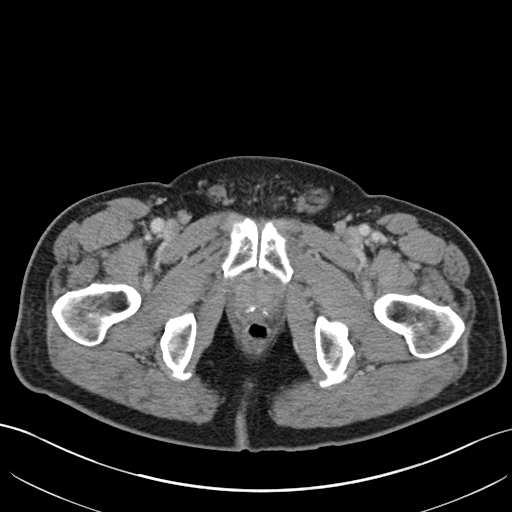
[im 19/100  soft-tissue]
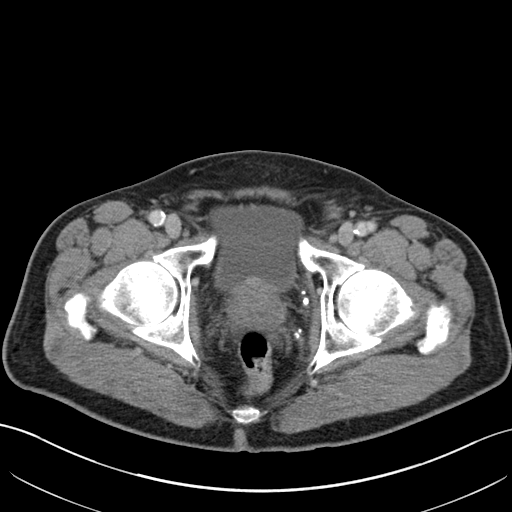
[im 31/100  soft-tissue]
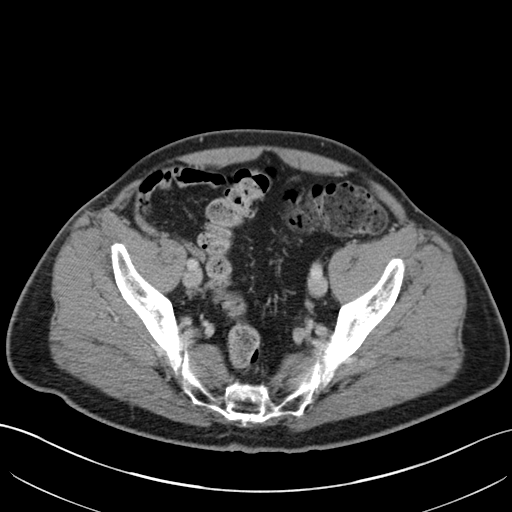
[im 38/100  soft-tissue]
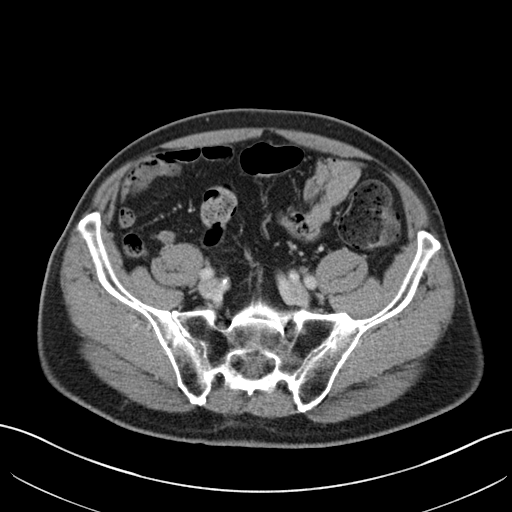
[im 44/100  soft-tissue]
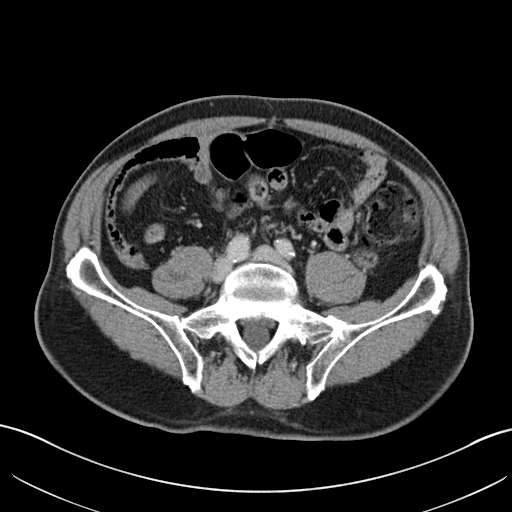
[im 50/100  soft-tissue]
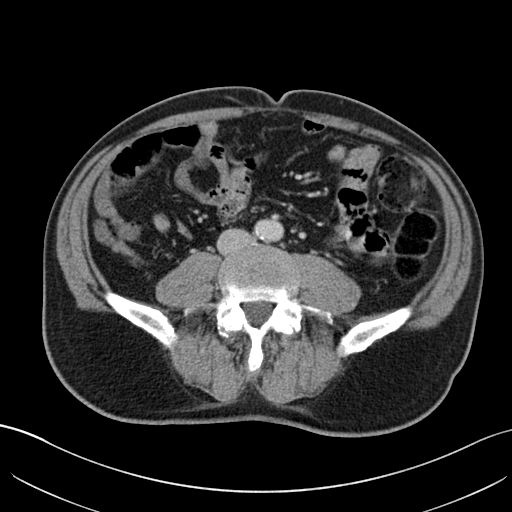
[im 56/100  soft-tissue]
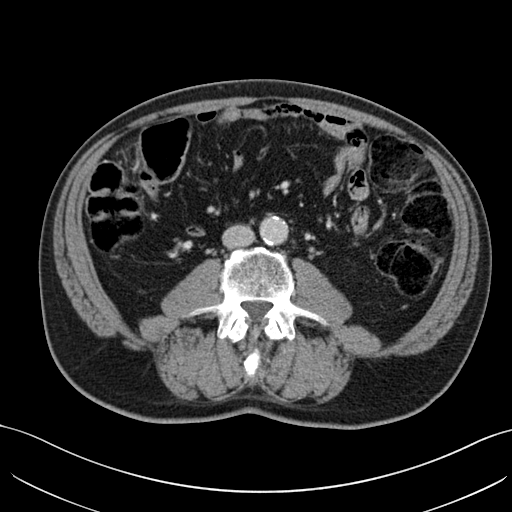
[im 62/100  soft-tissue]
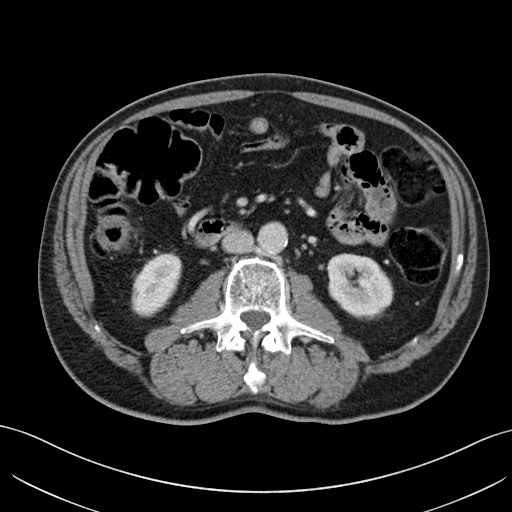
[im 62/100  bone]
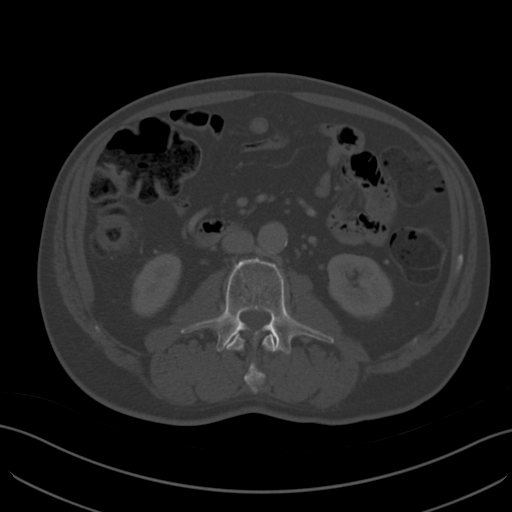
[im 69/100  soft-tissue]
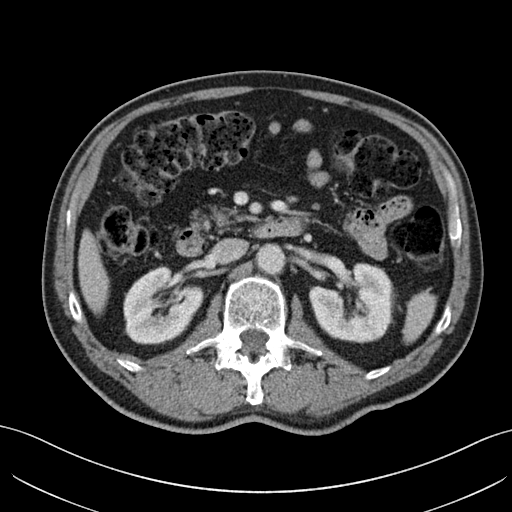
[im 81/100  soft-tissue]
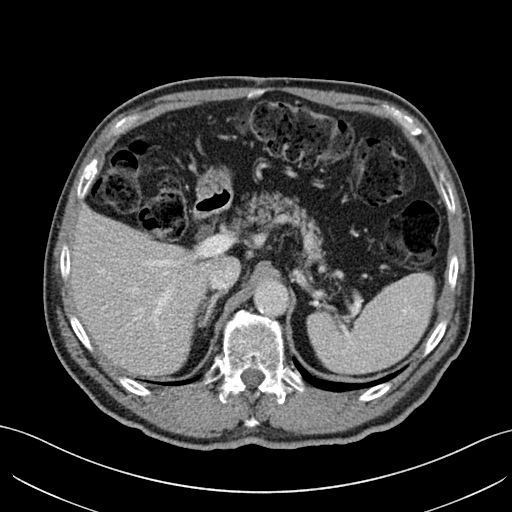
[im 87/100  soft-tissue]
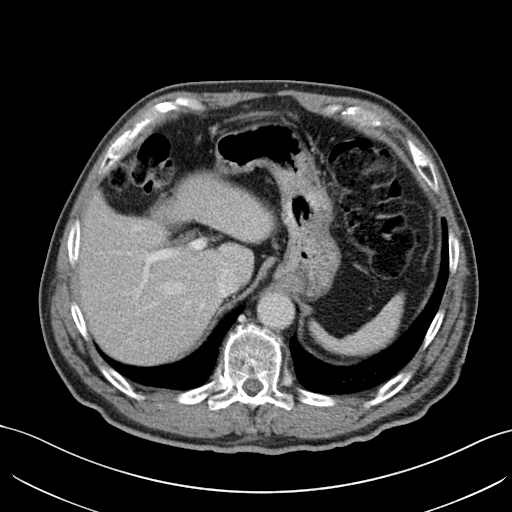
[im 93/100  soft-tissue]
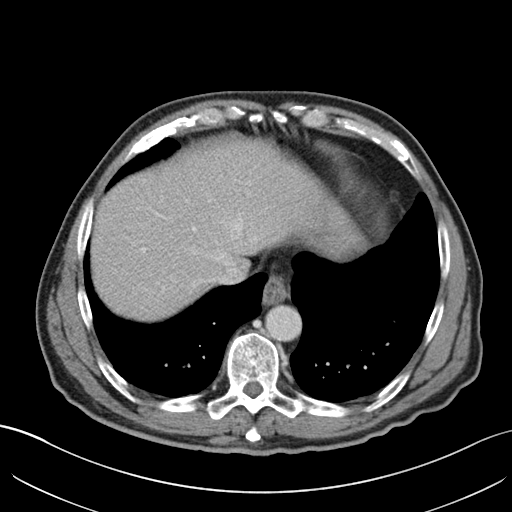

[Series 6: coronal st · coronal · 0.72mm/px · 3 of 151 slices shown]
[im 51/151  soft-tissue]
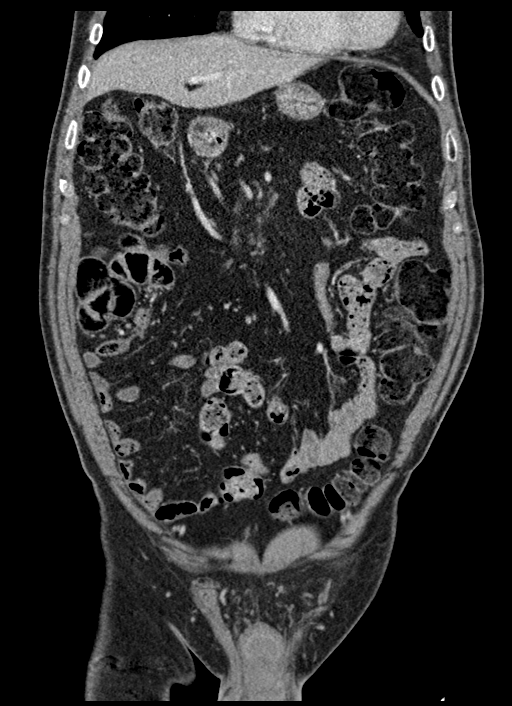
[im 67/151  soft-tissue]
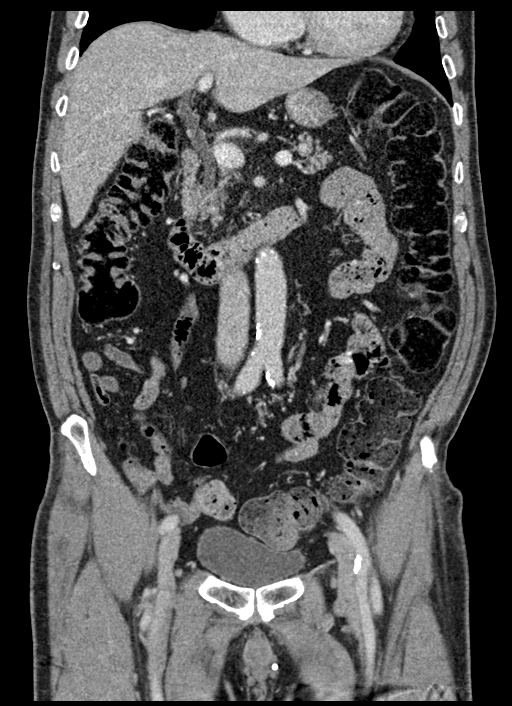
[im 84/151  soft-tissue]
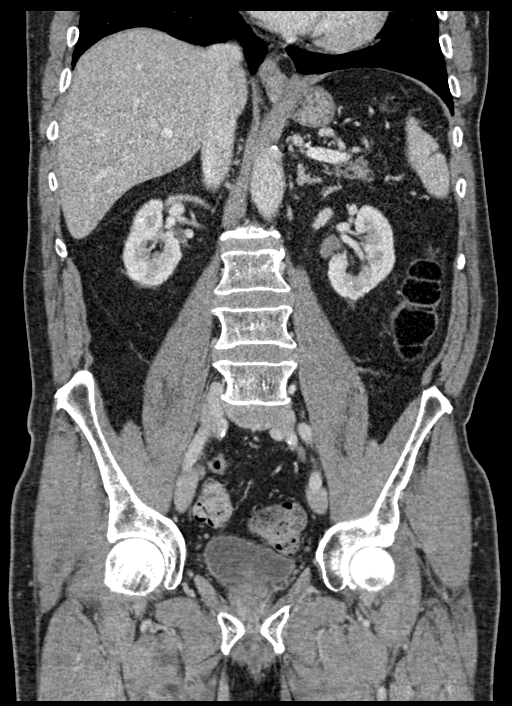

[16 of 46 positions shown; findings below may reference images not displayed]

FINDINGS: Lower chest: Mild dependent atelectasis noted. No pleural or
pericardial effusion. Heart size is upper normal.

Hepatobiliary: No focal liver abnormality is seen. No gallstones,
gallbladder wall thickening, or biliary dilatation.

Pancreas: Unremarkable. No pancreatic ductal dilatation or
surrounding inflammatory changes.

Spleen: Normal in size without focal abnormality.

Adrenals/Urinary Tract: 2 small nonobstructing stones are seen in
the right kidney. The larger measures 0.3 cm. 2-3 punctate
nonobstructing stones in the left kidney also noted. Tiny left renal
cyst is seen. No hydronephrosis or ureteral stone on the right or
left. Urinary bladder appears normal. Adrenal glands are normal.

Stomach/Bowel: Stomach is within normal limits. Appendix appears
normal. No evidence of bowel wall thickening, distention, or
inflammatory changes. Moderately large volume of stool throughout
the colon is present.

Vascular/Lymphatic: No significant vascular findings are present. No
enlarged abdominal or pelvic lymph nodes.

Reproductive: Prostate is unremarkable.

Other: None.

Musculoskeletal: No acute or focal abnormality.
IMPRESSION: No acute abnormality abdomen or pelvis.

Moderately large volume of stool throughout the colon.

Small nonobstructing renal stones in both kidneys.

Atherosclerosis.

## 2020-12-28 ENCOUNTER — Ambulatory Visit: Payer: Self-pay | Admitting: Cardiology

## 2021-01-11 ENCOUNTER — Encounter: Payer: Self-pay | Admitting: Cardiology

## 2021-01-11 ENCOUNTER — Other Ambulatory Visit: Payer: Self-pay

## 2021-01-11 ENCOUNTER — Ambulatory Visit: Payer: 59 | Admitting: Cardiology

## 2021-01-11 VITALS — BP 122/74 | HR 63 | Temp 98.0°F | Resp 16 | Ht 72.0 in | Wt 182.0 lb

## 2021-01-11 DIAGNOSIS — I209 Angina pectoris, unspecified: Secondary | ICD-10-CM

## 2021-01-11 DIAGNOSIS — G20 Parkinson's disease: Secondary | ICD-10-CM

## 2021-01-11 DIAGNOSIS — E782 Mixed hyperlipidemia: Secondary | ICD-10-CM

## 2021-01-11 DIAGNOSIS — I10 Essential (primary) hypertension: Secondary | ICD-10-CM

## 2021-01-11 DIAGNOSIS — Z87891 Personal history of nicotine dependence: Secondary | ICD-10-CM
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
## 2021-01-12 LAB — CMP14+EGFR
ALT: 18 IU/L (ref 0–44)
AST: 23 IU/L (ref 0–40)
Albumin/Globulin Ratio: 2.4 — ABNORMAL HIGH (ref 1.2–2.2)
Albumin: 4.8 g/dL — ABNORMAL HIGH (ref 3.7–4.7)
Alkaline Phosphatase: 96 IU/L (ref 44–121)
BUN/Creatinine Ratio: 14 (ref 10–24)
BUN: 16 mg/dL (ref 8–27)
Bilirubin Total: 0.9 mg/dL (ref 0.0–1.2)
CO2: 26 mmol/L (ref 20–29)
Calcium: 9.5 mg/dL (ref 8.6–10.2)
Chloride: 104 mmol/L (ref 96–106)
Creatinine, Ser: 1.17 mg/dL (ref 0.76–1.27)
Globulin, Total: 2 g/dL (ref 1.5–4.5)
Glucose: 111 mg/dL — ABNORMAL HIGH (ref 65–99)
Potassium: 4.6 mmol/L (ref 3.5–5.2)
Sodium: 141 mmol/L (ref 134–144)
Total Protein: 6.8 g/dL (ref 6.0–8.5)
eGFR: 66 mL/min/{1.73_m2} (ref >59)

## 2021-01-12 LAB — CBC
Hematocrit: 41.6 % (ref 37.5–51.0)
Hemoglobin: 14.5 g/dL (ref 13.0–17.7)
MCH: 33.6 pg — ABNORMAL HIGH (ref 26.6–33.0)
MCHC: 34.9 g/dL (ref 31.5–35.7)
MCV: 96 fL (ref 79–97)
Platelets: 179 10*3/uL (ref 150–450)
RBC: 4.32 x10E6/uL (ref 4.14–5.80)
RDW: 13 % (ref 11.6–15.4)
WBC: 7.5 10*3/uL (ref 3.4–10.8)

## 2021-01-14 NOTE — Progress Notes (Signed)
I spoke to [facility] she is working on all the [facility] from Friday she will get to it once she can we will make sure to inform patient.

## 2021-01-24 ENCOUNTER — Other Ambulatory Visit: Payer: 59

## 2021-01-29 ENCOUNTER — Other Ambulatory Visit: Payer: Self-pay

## 2021-01-29 ENCOUNTER — Ambulatory Visit: Payer: 59

## 2021-01-29 DIAGNOSIS — I209 Angina pectoris, unspecified: Secondary | ICD-10-CM

## 2021-02-04 DIAGNOSIS — I209 Angina pectoris, unspecified: Secondary | ICD-10-CM | POA: Diagnosis present

## 2021-02-05 ENCOUNTER — Encounter (HOSPITAL_COMMUNITY)
Admission: RE | Disposition: A | Discharge status: Home / Self Care | Payer: Self-pay | Source: Home / Self Care | Attending: Cardiology

## 2021-02-05 ENCOUNTER — Other Ambulatory Visit: Payer: Self-pay

## 2021-02-05 ENCOUNTER — Ambulatory Visit (HOSPITAL_COMMUNITY)
Admission: RE | Admit: 2021-02-05 | Discharge: 2021-02-05 | Disposition: A | Discharge status: Home / Self Care | Payer: 59 | Attending: Cardiology | Admitting: Cardiology

## 2021-02-05 DIAGNOSIS — Z91018 Allergy to other foods: Secondary | ICD-10-CM | POA: Diagnosis not present

## 2021-02-05 DIAGNOSIS — G20 Parkinson's disease: Secondary | ICD-10-CM | POA: Insufficient documentation

## 2021-02-05 DIAGNOSIS — E781 Pure hyperglyceridemia: Secondary | ICD-10-CM | POA: Diagnosis not present

## 2021-02-05 DIAGNOSIS — Z87891 Personal history of nicotine dependence: Secondary | ICD-10-CM | POA: Diagnosis not present

## 2021-02-05 DIAGNOSIS — Z880 Allergy status to penicillin: Secondary | ICD-10-CM | POA: Diagnosis not present

## 2021-02-05 DIAGNOSIS — Z79899 Other long term (current) drug therapy: Secondary | ICD-10-CM | POA: Diagnosis not present

## 2021-02-05 DIAGNOSIS — Z7982 Long term (current) use of aspirin: Secondary | ICD-10-CM | POA: Insufficient documentation

## 2021-02-05 DIAGNOSIS — I10 Essential (primary) hypertension: Secondary | ICD-10-CM | POA: Diagnosis not present

## 2021-02-05 DIAGNOSIS — Z91010 Allergy to peanuts: Secondary | ICD-10-CM | POA: Diagnosis not present

## 2021-02-05 DIAGNOSIS — I25119 Atherosclerotic heart disease of native coronary artery with unspecified angina pectoris: Secondary | ICD-10-CM | POA: Insufficient documentation

## 2021-02-05 DIAGNOSIS — E785 Hyperlipidemia, unspecified: Secondary | ICD-10-CM | POA: Insufficient documentation

## 2021-02-05 DIAGNOSIS — Z886 Allergy status to analgesic agent status: Secondary | ICD-10-CM | POA: Insufficient documentation

## 2021-02-05 DIAGNOSIS — I209 Angina pectoris, unspecified: Secondary | ICD-10-CM | POA: Diagnosis present

## 2021-02-05 DIAGNOSIS — I2510 Atherosclerotic heart disease of native coronary artery without angina pectoris: Secondary | ICD-10-CM | POA: Diagnosis present

## 2021-02-05 HISTORY — PX: LEFT HEART CATH AND CORONARY ANGIOGRAPHY: CATH118249

## 2021-02-05 SURGERY — LEFT HEART CATH AND CORONARY ANGIOGRAPHY
Anesthesia: LOCAL

## 2021-02-05 MED ORDER — FENTANYL CITRATE (PF) 100 MCG/2ML IJ SOLN
INTRAMUSCULAR | Status: AC
  Filled 2021-02-05: qty 2

## 2021-02-05 MED ORDER — SODIUM CHLORIDE 0.9 % WEIGHT BASED INFUSION: 1.0000 mL/kg/h | INTRAVENOUS | Status: DC

## 2021-02-05 MED ORDER — HEPARIN (PORCINE) IN NACL 1000-0.9 UT/500ML-% IV SOLN
INTRAVENOUS | Status: AC
  Filled 2021-02-05: qty 1000

## 2021-02-05 MED ORDER — IOHEXOL 350 MG/ML SOLN
INTRAVENOUS | Status: DC | PRN
  Administered 2021-02-05: 30 mL

## 2021-02-05 MED ORDER — MIDAZOLAM HCL 2 MG/2ML IJ SOLN
INTRAMUSCULAR | Status: DC | PRN
  Administered 2021-02-05 (×2): 1 mg via INTRAVENOUS

## 2021-02-05 MED ORDER — ASPIRIN 81 MG PO CHEW
81.0000 mg | CHEWABLE_TABLET | ORAL | Status: AC
  Administered 2021-02-05: 81 mg via ORAL
  Filled 2021-02-05: qty 1

## 2021-02-05 MED ORDER — ACETAMINOPHEN 325 MG PO TABS: 650.0000 mg | ORAL_TABLET | ORAL | Status: DC | PRN

## 2021-02-05 MED ORDER — SODIUM CHLORIDE 0.9% FLUSH: 3.0000 mL | Freq: Two times a day (BID) | INTRAVENOUS | Status: DC

## 2021-02-05 MED ORDER — HEPARIN SODIUM (PORCINE) 1000 UNIT/ML IJ SOLN
INTRAMUSCULAR | Status: AC
  Filled 2021-02-05: qty 1

## 2021-02-05 MED ORDER — VERAPAMIL HCL 2.5 MG/ML IV SOLN
INTRAVENOUS | Status: DC | PRN
  Administered 2021-02-05: 10 mL via INTRA_ARTERIAL

## 2021-02-05 MED ORDER — FENTANYL CITRATE (PF) 100 MCG/2ML IJ SOLN
INTRAMUSCULAR | Status: DC | PRN
  Administered 2021-02-05 (×2): 25 ug via INTRAVENOUS

## 2021-02-05 MED ORDER — HEPARIN (PORCINE) IN NACL 1000-0.9 UT/500ML-% IV SOLN
INTRAVENOUS | Status: DC | PRN
  Administered 2021-02-05 (×2): 500 mL

## 2021-02-05 MED ORDER — SODIUM CHLORIDE 0.9 % IV SOLN: 250.0000 mL | INTRAVENOUS | Status: DC | PRN

## 2021-02-05 MED ORDER — ISOSORBIDE MONONITRATE ER 60 MG PO TB24: 60.0000 mg | ORAL_TABLET | Freq: Every day | ORAL | 1 refills | Status: DC

## 2021-02-05 MED ORDER — ONDANSETRON HCL 4 MG/2ML IJ SOLN: 4.0000 mg | Freq: Four times a day (QID) | INTRAMUSCULAR | Status: DC | PRN

## 2021-02-05 MED ORDER — LIDOCAINE HCL (PF) 1 % IJ SOLN
INTRAMUSCULAR | Status: AC
  Filled 2021-02-05: qty 30

## 2021-02-05 MED ORDER — HEPARIN SODIUM (PORCINE) 1000 UNIT/ML IJ SOLN
INTRAMUSCULAR | Status: DC | PRN
  Administered 2021-02-05: 5000 [IU] via INTRAVENOUS

## 2021-02-05 MED ORDER — SODIUM CHLORIDE 0.9 % WEIGHT BASED INFUSION
3.0000 mL/kg/h | INTRAVENOUS | Status: DC
  Administered 2021-02-05: 3 mL/kg/h via INTRAVENOUS

## 2021-02-05 MED ORDER — LIDOCAINE HCL (PF) 1 % IJ SOLN
INTRAMUSCULAR | Status: DC | PRN
  Administered 2021-02-05: 2 mL

## 2021-02-05 MED ORDER — SODIUM CHLORIDE 0.9% FLUSH: 3.0000 mL | INTRAVENOUS | Status: DC | PRN

## 2021-02-05 MED ORDER — VERAPAMIL HCL 2.5 MG/ML IV SOLN
INTRAVENOUS | Status: AC
  Filled 2021-02-05: qty 2

## 2021-02-05 MED ORDER — MIDAZOLAM HCL 2 MG/2ML IJ SOLN
INTRAMUSCULAR | Status: AC
  Filled 2021-02-05: qty 2

## 2021-02-05 SURGICAL SUPPLY — 8 items
CATH OPTITORQUE TIG 4.0 5F (CATHETERS) ×1 IMPLANT
DEVICE RAD COMP TR BAND LRG (VASCULAR PRODUCTS) ×1 IMPLANT
GLIDESHEATH SLEND A-KIT 6F 22G (SHEATH) ×2 IMPLANT
KIT HEART LEFT (KITS) ×2 IMPLANT
PACK CARDIAC CATHETERIZATION (CUSTOM PROCEDURE TRAY) ×2 IMPLANT
TRANSDUCER W/STOPCOCK (MISCELLANEOUS) ×2 IMPLANT
TUBING CIL FLEX 10 FLL-RA (TUBING) ×2 IMPLANT
WIRE HI TORQ VERSACORE-J 145CM (WIRE) ×2 IMPLANT

## 2021-02-05 NOTE — Progress Notes (Signed)
Pt ambulated without difficulty or bleeding.   Discharged home with his daughter who will drive and stay with pt x 24 hrs.

## 2021-02-05 NOTE — Discharge Instructions (Signed)
Radial Site Care  This sheet gives you information about how to care for yourself after your procedure. Your health care provider may also give you more specific instructions. If you have problems or questions, contact your health care provider. What can I expect after the procedure? After the procedure, it is common to have: Bruising and tenderness at the catheter insertion area. Follow these instructions at home: Medicines Take over-the-counter and prescription medicines only as told by your health care provider. Insertion site care Follow instructions from your health care provider about how to take care of your insertion site. Make sure you: Wash your hands with soap and water before you remove your bandage (dressing). If soap and water are not available, use hand sanitizer. May remove dressing in 24 hours. Check your insertion site every day for signs of infection. Check for: Redness, swelling, or pain. Fluid or blood. Pus or a bad smell. Warmth. Do no take baths, swim, or use a hot tub for 5 days. You may shower 24-48 hours after the procedure. Remove the dressing and gently wash the site with plain soap and water. Pat the area dry with a clean towel. Do not rub the site. That could cause bleeding. Do not apply powder or lotion to the site. Activity  For 24 hours after the procedure, or as directed by your health care provider: Do not flex or bend the affected arm. Do not push or pull heavy objects with the affected arm. Do not drive yourself home from the hospital or clinic. You may drive 24 hours after the procedure. Do not operate machinery or power tools. KEEP ARM ELEVATED THE REMAINDER OF THE DAY. Do not push, pull or lift anything that is heavier than 10 lb for 5 days. Ask your health care provider when it is okay to: Return to work or school. Resume usual physical activities or sports. Resume sexual activity. General instructions If the catheter site starts to  bleed, raise your arm and put firm pressure on the site. If the bleeding does not stop, get help right away. This is a medical emergency. DRINK PLENTY OF FLUIDS FOR THE NEXT 2-3 DAYS. No alcohol consumption for 24 hours after receiving sedation. If you went home on the same day as your procedure, a responsible adult should be with you for the first 24 hours after you arrive home. Keep all follow-up visits as told by your health care provider. This is important. Contact a health care provider if: You have a fever. You have redness, swelling, or yellow drainage around your insertion site. Get help right away if: You have unusual pain at the radial site. The catheter insertion area swells very fast. The insertion area is bleeding, and the bleeding does not stop when you hold steady pressure on the area. Your arm or hand becomes pale, cool, tingly, or numb. These symptoms may represent a serious problem that is an emergency. Do not wait to see if the symptoms will go away. Get medical help right away. Call your local emergency services (911 in the [place] Do not drive yourself to the hospital. Summary After the procedure, it is common to have bruising and tenderness at the site. Follow instructions from your health care provider about how to take care of your radial site wound. Check the wound every day for signs of infection.  This information is not intended to replace advice given to you by your health care provider. Make sure you discuss any questions you have with   your health care provider. Document Revised: 09/02/2017 Document Reviewed: 09/02/2017 Elsevier Patient Education  2020 [employer]  

## 2021-02-06 ENCOUNTER — Encounter (HOSPITAL_COMMUNITY): Payer: Self-pay | Admitting: Cardiology
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
## 2021-02-22 ENCOUNTER — Ambulatory Visit: Payer: 59 | Admitting: Cardiology

## 2021-02-22 ENCOUNTER — Encounter: Payer: Self-pay | Admitting: Cardiology

## 2021-02-22 ENCOUNTER — Other Ambulatory Visit: Payer: Self-pay

## 2021-02-22 VITALS — BP 130/81 | HR 81 | Temp 98.4°F | Resp 16 | Ht 72.0 in | Wt 180.0 lb

## 2021-02-22 DIAGNOSIS — I2510 Atherosclerotic heart disease of native coronary artery without angina pectoris: Secondary | ICD-10-CM

## 2021-02-22 DIAGNOSIS — I351 Nonrheumatic aortic (valve) insufficiency: Secondary | ICD-10-CM

## 2021-02-22 DIAGNOSIS — Z87891 Personal history of nicotine dependence: Secondary | ICD-10-CM

## 2021-02-22 DIAGNOSIS — I209 Angina pectoris, unspecified: Secondary | ICD-10-CM

## 2021-02-22 DIAGNOSIS — I10 Essential (primary) hypertension: Secondary | ICD-10-CM

## 2021-02-22 DIAGNOSIS — I340 Nonrheumatic mitral (valve) insufficiency: Secondary | ICD-10-CM

## 2021-02-22 DIAGNOSIS — E782 Mixed hyperlipidemia: Secondary | ICD-10-CM

## 2021-02-22 DIAGNOSIS — G20 Parkinson's disease: Secondary | ICD-10-CM
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
## 2021-03-01 ENCOUNTER — Other Ambulatory Visit: Payer: Self-pay | Admitting: Cardiology

## 2021-03-27 ENCOUNTER — Other Ambulatory Visit: Payer: Self-pay | Admitting: Cardiology

## 2021-04-02 ENCOUNTER — Telehealth: Payer: Self-pay | Admitting: Cardiology

## 2021-04-02 ENCOUNTER — Other Ambulatory Visit: Payer: Self-pay

## 2021-04-02 MED ORDER — ISOSORBIDE MONONITRATE ER 60 MG PO TB24: 60.0000 mg | ORAL_TABLET | Freq: Every day | ORAL | 1 refills | Status: DC

## 2021-04-02 NOTE — Telephone Encounter (Signed)
Pt had question about medication isosorbide he wanted to discuss w/med assistant. Can call his cell # at [phone].

## 2021-04-02 NOTE — Telephone Encounter (Signed)
Done

## 2021-08-21 ENCOUNTER — Other Ambulatory Visit: Payer: Self-pay

## 2021-08-21 ENCOUNTER — Ambulatory Visit: Payer: 59

## 2021-08-21 DIAGNOSIS — I340 Nonrheumatic mitral (valve) insufficiency: Secondary | ICD-10-CM

## 2021-08-21 DIAGNOSIS — I351 Nonrheumatic aortic (valve) insufficiency: Secondary | ICD-10-CM

## 2021-09-05 ENCOUNTER — Ambulatory Visit: Payer: 59 | Admitting: Cardiology

## 2021-09-09 ENCOUNTER — Other Ambulatory Visit: Payer: Self-pay | Admitting: Cardiology

## 2021-09-12 ENCOUNTER — Ambulatory Visit: Payer: 59 | Admitting: Cardiology

## 2021-09-12 ENCOUNTER — Encounter: Payer: Self-pay | Admitting: Cardiology

## 2021-09-12 ENCOUNTER — Other Ambulatory Visit: Payer: Self-pay

## 2021-09-12 VITALS — BP 112/65 | HR 68 | Temp 97.7°F | Resp 16 | Ht 72.0 in | Wt 186.0 lb

## 2021-09-12 DIAGNOSIS — I340 Nonrheumatic mitral (valve) insufficiency: Secondary | ICD-10-CM

## 2021-09-12 DIAGNOSIS — I351 Nonrheumatic aortic (valve) insufficiency: Secondary | ICD-10-CM

## 2021-09-12 DIAGNOSIS — E782 Mixed hyperlipidemia: Secondary | ICD-10-CM

## 2021-09-12 DIAGNOSIS — G20 Parkinson's disease: Secondary | ICD-10-CM

## 2021-09-12 DIAGNOSIS — I10 Essential (primary) hypertension: Secondary | ICD-10-CM

## 2021-09-12 DIAGNOSIS — I2510 Atherosclerotic heart disease of native coronary artery without angina pectoris: Secondary | ICD-10-CM
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
## 2021-12-16 ENCOUNTER — Encounter: Payer: Self-pay | Admitting: Internal Medicine

## 2022-02-06 ENCOUNTER — Emergency Department (HOSPITAL_COMMUNITY): Payer: No Typology Code available for payment source

## 2022-02-06 ENCOUNTER — Inpatient Hospital Stay (HOSPITAL_COMMUNITY)
Admission: EM | Admit: 2022-02-06 | Discharge: 2022-02-10 | DRG: 501 | Disposition: A | Discharge status: Home / Self Care | Payer: No Typology Code available for payment source | Attending: General Surgery | Admitting: General Surgery

## 2022-02-06 ENCOUNTER — Encounter (HOSPITAL_COMMUNITY): Payer: Self-pay

## 2022-02-06 ENCOUNTER — Other Ambulatory Visit: Payer: Self-pay

## 2022-02-06 DIAGNOSIS — M25561 Pain in right knee: Secondary | ICD-10-CM | POA: Diagnosis present

## 2022-02-06 DIAGNOSIS — E785 Hyperlipidemia, unspecified: Secondary | ICD-10-CM | POA: Diagnosis present

## 2022-02-06 DIAGNOSIS — M25511 Pain in right shoulder: Secondary | ICD-10-CM | POA: Diagnosis present

## 2022-02-06 DIAGNOSIS — Z79899 Other long term (current) drug therapy: Secondary | ICD-10-CM

## 2022-02-06 DIAGNOSIS — I951 Orthostatic hypotension: Secondary | ICD-10-CM | POA: Diagnosis present

## 2022-02-06 DIAGNOSIS — F4310 Post-traumatic stress disorder, unspecified: Secondary | ICD-10-CM | POA: Diagnosis present

## 2022-02-06 DIAGNOSIS — Z880 Allergy status to penicillin: Secondary | ICD-10-CM

## 2022-02-06 DIAGNOSIS — G20 Parkinson's disease: Secondary | ICD-10-CM | POA: Diagnosis present

## 2022-02-06 DIAGNOSIS — S0232XA Fracture of orbital floor, left side, initial encounter for closed fracture: Secondary | ICD-10-CM | POA: Diagnosis present

## 2022-02-06 DIAGNOSIS — G9389 Other specified disorders of brain: Secondary | ICD-10-CM | POA: Diagnosis present

## 2022-02-06 DIAGNOSIS — Z7982 Long term (current) use of aspirin: Secondary | ICD-10-CM

## 2022-02-06 DIAGNOSIS — Z23 Encounter for immunization: Secondary | ICD-10-CM

## 2022-02-06 DIAGNOSIS — S0219XA Other fracture of base of skull, initial encounter for closed fracture: Secondary | ICD-10-CM | POA: Diagnosis present

## 2022-02-06 DIAGNOSIS — Z91010 Allergy to peanuts: Secondary | ICD-10-CM

## 2022-02-06 DIAGNOSIS — S0181XA Laceration without foreign body of other part of head, initial encounter: Secondary | ICD-10-CM

## 2022-02-06 DIAGNOSIS — I10 Essential (primary) hypertension: Secondary | ICD-10-CM | POA: Diagnosis not present

## 2022-02-06 DIAGNOSIS — S0012XA Contusion of left eyelid and periocular area, initial encounter: Secondary | ICD-10-CM | POA: Diagnosis present

## 2022-02-06 DIAGNOSIS — S0101XA Laceration without foreign body of scalp, initial encounter: Secondary | ICD-10-CM | POA: Diagnosis present

## 2022-02-06 DIAGNOSIS — W208XXA Other cause of strike by thrown, projected or falling object, initial encounter: Secondary | ICD-10-CM | POA: Diagnosis present

## 2022-02-06 DIAGNOSIS — S22029A Unspecified fracture of second thoracic vertebra, initial encounter for closed fracture: Principal | ICD-10-CM | POA: Diagnosis present

## 2022-02-06 DIAGNOSIS — Z9049 Acquired absence of other specified parts of digestive tract: Secondary | ICD-10-CM

## 2022-02-06 DIAGNOSIS — Z886 Allergy status to analgesic agent status: Secondary | ICD-10-CM | POA: Diagnosis not present

## 2022-02-06 DIAGNOSIS — Z91018 Allergy to other foods: Secondary | ICD-10-CM | POA: Diagnosis not present

## 2022-02-06 DIAGNOSIS — Z888 Allergy status to other drugs, medicaments and biological substances status: Secondary | ICD-10-CM

## 2022-02-06 DIAGNOSIS — T1490XA Injury, unspecified, initial encounter: Principal | ICD-10-CM

## 2022-02-06 DIAGNOSIS — Z87891 Personal history of nicotine dependence: Secondary | ICD-10-CM | POA: Diagnosis not present

## 2022-02-06 LAB — COMPREHENSIVE METABOLIC PANEL
ALT: 16 U/L (ref 0–44)
AST: 55 U/L — ABNORMAL HIGH (ref 15–41)
Albumin: 4.3 g/dL (ref 3.5–5.0)
Alkaline Phosphatase: 69 U/L (ref 38–126)
Anion gap: 10 (ref 5–15)
BUN: 20 mg/dL (ref 8–23)
CO2: 21 mmol/L — ABNORMAL LOW (ref 22–32)
Calcium: 9.1 mg/dL (ref 8.9–10.3)
Chloride: 108 mmol/L (ref 98–111)
Creatinine, Ser: 1.5 mg/dL — ABNORMAL HIGH (ref 0.61–1.24)
GFR, Estimated: 49 mL/min — ABNORMAL LOW (ref >60)
Glucose, Bld: 112 mg/dL — ABNORMAL HIGH (ref 70–99)
Potassium: 4.8 mmol/L (ref 3.5–5.1)
Sodium: 139 mmol/L (ref 135–145)
Total Bilirubin: 1.9 mg/dL — ABNORMAL HIGH (ref 0.3–1.2)
Total Protein: 6.3 g/dL — ABNORMAL LOW (ref 6.5–8.1)

## 2022-02-06 LAB — URINALYSIS, ROUTINE W REFLEX MICROSCOPIC
Bilirubin Urine: NEGATIVE
Glucose, UA: NEGATIVE mg/dL
Ketones, ur: 5 mg/dL — AB
Leukocytes,Ua: NEGATIVE
Nitrite: NEGATIVE
Protein, ur: 30 mg/dL — AB
Specific Gravity, Urine: 1.017 (ref 1.005–1.030)
pH: 8 (ref 5.0–8.0)

## 2022-02-06 LAB — I-STAT CHEM 8, ED
BUN: 26 mg/dL — ABNORMAL HIGH (ref 8–23)
Calcium, Ion: 0.72 mmol/L — CL (ref 1.15–1.40)
Chloride: 110 mmol/L (ref 98–111)
Creatinine, Ser: 1.4 mg/dL — ABNORMAL HIGH (ref 0.61–1.24)
Glucose, Bld: 109 mg/dL — ABNORMAL HIGH (ref 70–99)
HCT: 39 % (ref 39.0–52.0)
Hemoglobin: 13.3 g/dL (ref 13.0–17.0)
Potassium: 4.5 mmol/L (ref 3.5–5.1)
Sodium: 135 mmol/L (ref 135–145)
TCO2: 18 mmol/L — ABNORMAL LOW (ref 22–32)

## 2022-02-06 LAB — CBC
HCT: 40.8 % (ref 39.0–52.0)
Hemoglobin: 15.3 g/dL (ref 13.0–17.0)
MCH: 35.3 pg — ABNORMAL HIGH (ref 26.0–34.0)
MCHC: 37.5 g/dL — ABNORMAL HIGH (ref 30.0–36.0)
MCV: 94 fL (ref 80.0–100.0)
Platelets: 141 10*3/uL — ABNORMAL LOW (ref 150–400)
RBC: 4.34 MIL/uL (ref 4.22–5.81)
RDW: 12.8 % (ref 11.5–15.5)
WBC: 9.9 10*3/uL (ref 4.0–10.5)
nRBC: 0 % (ref 0.0–0.2)

## 2022-02-06 LAB — PROTIME-INR
INR: 1.1 (ref 0.8–1.2)
Prothrombin Time: 14.1 seconds (ref 11.4–15.2)

## 2022-02-06 LAB — SAMPLE TO BLOOD BANK

## 2022-02-06 LAB — ETHANOL: Alcohol, Ethyl (B): <10 mg/dL (ref <10)

## 2022-02-06 LAB — LACTIC ACID, PLASMA: Lactic Acid, Venous: 2.9 mmol/L (ref 0.5–1.9)

## 2022-02-06 MED ORDER — MORPHINE SULFATE (PF) 2 MG/ML IV SOLN
2.0000 mg | INTRAVENOUS | Status: DC | PRN
  Administered 2022-02-07 – 2022-02-08 (×3): 4 mg via INTRAVENOUS
  Filled 2022-02-06 (×3): qty 2

## 2022-02-06 MED ORDER — TETANUS-DIPHTH-ACELL PERTUSSIS 5-2.5-18.5 LF-MCG/0.5 IM SUSY
0.5000 mL | PREFILLED_SYRINGE | Freq: Once | INTRAMUSCULAR | Status: AC
  Administered 2022-02-06: 0.5 mL via INTRAMUSCULAR
  Filled 2022-02-06: qty 0.5

## 2022-02-06 MED ORDER — LORAZEPAM 2 MG/ML IJ SOLN
1.0000 mg | Freq: Once | INTRAMUSCULAR | Status: AC
  Administered 2022-02-06: 1 mg via INTRAVENOUS
  Filled 2022-02-06: qty 1

## 2022-02-06 MED ORDER — ONDANSETRON HCL 4 MG/2ML IJ SOLN
4.0000 mg | Freq: Once | INTRAMUSCULAR | Status: AC
Start: 2022-02-06 — End: 2022-02-06
  Administered 2022-02-06: 4 mg via INTRAVENOUS
  Filled 2022-02-06: qty 2

## 2022-02-06 MED ORDER — SODIUM CHLORIDE 0.9 % IV SOLN: INTRAVENOUS | Status: DC

## 2022-02-06 MED ORDER — SODIUM CHLORIDE 0.9 % IV BOLUS
1000.0000 mL | Freq: Once | INTRAVENOUS | Status: AC
  Administered 2022-02-06: 1000 mL via INTRAVENOUS

## 2022-02-06 MED ORDER — ENOXAPARIN SODIUM 30 MG/0.3ML IJ SOSY: 30.0000 mg | PREFILLED_SYRINGE | Freq: Two times a day (BID) | INTRAMUSCULAR | Status: DC

## 2022-02-06 MED ORDER — CEFAZOLIN SODIUM-DEXTROSE 2-4 GM/100ML-% IV SOLN
2.0000 g | Freq: Once | INTRAVENOUS | Status: AC
Start: 2022-02-06 — End: 2022-02-06
  Administered 2022-02-06: 2 g via INTRAVENOUS
  Filled 2022-02-06: qty 100

## 2022-02-06 MED ORDER — METOPROLOL TARTRATE 5 MG/5ML IV SOLN: 5.0000 mg | Freq: Four times a day (QID) | INTRAVENOUS | Status: DC | PRN

## 2022-02-06 MED ORDER — ONDANSETRON 4 MG PO TBDP: 4.0000 mg | ORAL_TABLET | Freq: Four times a day (QID) | ORAL | Status: DC | PRN

## 2022-02-06 MED ORDER — DOCUSATE SODIUM 100 MG PO CAPS
100.0000 mg | ORAL_CAPSULE | Freq: Two times a day (BID) | ORAL | Status: DC
  Administered 2022-02-07 – 2022-02-10 (×7): 100 mg via ORAL
  Filled 2022-02-06 (×7): qty 1

## 2022-02-06 MED ORDER — HYDRALAZINE HCL 20 MG/ML IJ SOLN: 10.0000 mg | INTRAMUSCULAR | Status: DC | PRN

## 2022-02-06 MED ORDER — OXYCODONE HCL 5 MG PO TABS
5.0000 mg | ORAL_TABLET | ORAL | Status: DC | PRN
  Administered 2022-02-06 – 2022-02-09 (×8): 5 mg via ORAL
  Filled 2022-02-06 (×8): qty 1

## 2022-02-06 MED ORDER — LIDOCAINE HCL (PF) 1 % IJ SOLN
30.0000 mL | Freq: Once | INTRAMUSCULAR | Status: AC
  Administered 2022-02-06: 30 mL via INTRADERMAL
  Filled 2022-02-06: qty 30

## 2022-02-06 MED ORDER — FENTANYL CITRATE PF 50 MCG/ML IJ SOSY
50.0000 ug | PREFILLED_SYRINGE | Freq: Once | INTRAMUSCULAR | Status: AC
  Administered 2022-02-06: 50 ug via INTRAVENOUS
  Filled 2022-02-06: qty 1

## 2022-02-06 MED ORDER — ONDANSETRON HCL 4 MG/2ML IJ SOLN
4.0000 mg | Freq: Four times a day (QID) | INTRAMUSCULAR | Status: DC | PRN
  Administered 2022-02-10: 4 mg via INTRAVENOUS
  Filled 2022-02-06: qty 2

## 2022-02-06 MED ORDER — ACETAMINOPHEN 325 MG PO TABS: 650.0000 mg | ORAL_TABLET | ORAL | Status: DC | PRN
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
## 2022-02-06 NOTE — ED Notes (Signed)
Pt transported to CT at this time by TRN

## 2022-02-06 NOTE — ED Provider Notes (Signed)
[name] EMERGENCY DEPARTMENT Provider Note   CSN: [id number] Arrival date & time: 02/06/22  1728     History  Chief Complaint  Patient presents with   Head Injury    Randall Moore. is a 76 y.o. male.  HPI Adult male presents via EMS after sustaining injuries while cutting down a tree.  Patient is awake, alert.  EMS reports no hemodynamic instability in route, provides additional details of the event.  Patient is subsequently joined by 2 male family members who are assisting him with the tree removal.  Apparently patient was cutting a tree when it collapsed, and a broken section fell onto his head.  Patient sustained substantial laceration to his head, had loss of consciousness.  Upon awakening he had no weakness in any extremity, and currently complains of pain in his headache, and right forearm.  He denies changes, vision changes. History is most notable for history of PTSD, Parkinson's.  Patient arrives wearing gloves on both hands, states that he cannot have blood on his hands.  Patient is a [place] veteran.    Home Medications Prior to Admission medications   Medication Sig Start Date End Date Taking? Authorizing Provider  amLODipine (NORVASC) 5 MG tablet Take 5 mg by mouth daily.     [provider]  aspirin EC 81 MG tablet Take 81 mg by mouth every morning.     [provider]  atorvastatin (LIPITOR) 80 MG tablet Take 80 mg by mouth every evening. 03/18/16   [provider]  B Complex-C (SUPER B-C PO) Take 1 tablet by mouth daily.    [provider]  CALCIUM PO Take 600 mg by mouth daily. Vitamin D    [provider]  carbidopa-levodopa (SINEMET IR) 25-100 MG tablet Take 1 tablet by mouth 4 (four) times daily. 03/29/20   [provider]  carvedilol (COREG) 12.5 MG tablet Take 6.25 mg by mouth 2 (two) times daily. 06/22/16   [provider]  clobetasol ointment (TEMOVATE) 3.22 % Apply 1  application topically 3 (three) times daily as needed (for rash or itching).  02/21/16   [provider]  desloratadine (CLARINEX) 5 MG tablet Take 5 mg by mouth 2 (two) times a day.     [provider]  diphenhydrAMINE (BENADRYL) 25 MG tablet Take 25 mg by mouth every 6 (six) hours as needed for itching or allergies.    [provider]  EPINEPHrine 0.3 mg/0.3 mL IJ SOAJ injection Inject 0.3 mg into the muscle as needed for anaphylaxis.     [provider]  ferrous sulfate 325 (65 FE) MG tablet Take 325 mg by mouth daily with breakfast.    [provider]  isosorbide mononitrate (IMDUR) 60 MG 24 hr tablet TAKE 1 TABLET BY MOUTH EVERY DAY 09/09/21   Tolia, Sunit, DO  mometasone (NASONEX) 50 MCG/ACT nasal spray Place 2 sprays into the nose daily as needed for allergies. 11/05/20   [provider]  nitroGLYCERIN (NITROSTAT) 0.4 MG SL tablet Place 0.4 mg under the tongue every 5 (five) minutes as needed for chest pain.  03/18/16   [provider]  Olopatadine HCl 0.6 % SOLN Place into the nose.    [provider]  Omega-3 Fatty Acids (OMEGA-3 PO) Take 900 mg by mouth daily. 1400 mg fish oil    [provider]  omeprazole (PRILOSEC) 40 MG capsule Take 40 mg by mouth daily.    [provider]  OVER THE COUNTER MEDICATION Take 1 tablet by mouth daily. Take one Fruit and one vegetable    [provider]  SAW PALMETTO, SERENOA REPENS, PO Take 250 mg by mouth 2 (two) times daily. Beta Plus    [provider]      Allergies    Ibuprofen, Other, Peanut-containing drug products, Penicillins, and Tomato    Review of Systems   Review of Systems  All other systems reviewed and are negative.   Physical Exam Updated Vital Signs BP (!) 98/50   Pulse 77   Temp (!) 97.5 F (36.4 C) (Axillary)   Resp 18   Ht 6' (1.829 m)   Wt 79.4 kg   SpO2 100%   BMI 23.73 kg/m  Physical Exam Vitals and nursing note  reviewed.  Constitutional:      General: He is not in acute distress.    Appearance: He is well-developed.  HENT:     Head: Normocephalic and atraumatic.   Eyes:     Conjunctiva/sclera: Conjunctivae normal.  Neck:     Comments: Patient unwilling to wear cervical collar in transport, and then only transiently with tolerated in the ED.  Cervical spine soft, no tenderness, guarding, no crepitus, no deformity Cardiovascular:     Rate and Rhythm: Normal rate and regular rhythm.  Pulmonary:     Effort: Pulmonary effort is normal. No respiratory distress.     Breath sounds: No stridor.  Abdominal:     General: There is no distension.  Musculoskeletal:       Arms:     Cervical back: No spinous process tenderness or muscular tenderness.     Comments: Patient has abrasions on knees bilaterally, denies pain in either of them, flexes and extends both spontaneously and to command without discomfort.  Skin:    General: Skin is warm and dry.  Neurological:     Mental Status: He is alert and oriented to person, place, and time.     Cranial Nerves: No cranial nerve deficit, dysarthria or facial asymmetry.     Motor: Tremor present. No weakness.    HEAD WOUND   ED Results / Procedures / Treatments   Labs (all labs ordered are listed, but only abnormal results are displayed) Labs Reviewed  COMPREHENSIVE METABOLIC PANEL - Abnormal; Notable for the following components:      Result Value   CO2 21 (*)    Glucose, Bld 112 (*)    Creatinine, Ser 1.50 (*)    Total Protein 6.3 (*)    AST 55 (*)    Total Bilirubin 1.9 (*)    GFR, Estimated 49 (*)    All other components within normal limits  CBC - Abnormal; Notable for the following components:   MCH 35.3 (*)    MCHC 37.5 (*)    Platelets 141 (*)    All other components within normal limits  URINALYSIS, ROUTINE W REFLEX MICROSCOPIC - Abnormal; Notable for the following components:   APPearance HAZY (*)    Hgb urine dipstick SMALL (*)     Ketones, ur 5 (*)    Protein, ur 30 (*)    Bacteria, UA RARE (*)    All other components within normal limits  LACTIC ACID, PLASMA - Abnormal; Notable for the following components:   Lactic Acid, Venous 2.9 (*)    All other components within normal limits  I-STAT CHEM 8, ED - Abnormal; Notable for the following components:   BUN 26 (*)  Creatinine, Ser 1.40 (*)    Glucose, Bld 109 (*)    Calcium, Ion 0.72 (*)    TCO2 18 (*)    All other components within normal limits  RESP PANEL BY RT-PCR (FLU A&B, COVID) ARPGX2  ETHANOL  PROTIME-INR  CBC  CREATININE, SERUM  CBC  BASIC METABOLIC PANEL  SAMPLE TO BLOOD BANK    EKG EKG Interpretation  Date/Time:  Thursday February 06 2022 17:37:16 EDT Ventricular Rate:  69 PR Interval:  152 QRS Duration: 104 QT Interval:  410 QTC Calculation: 440 R Axis:   69 Text Interpretation: Sinus rhythm Inferior injury pattern T wave abnormality Abnormal ECG Confirmed by Carmin Muskrat [phone]) on 02/06/2022 8:06:22 PM  Radiology DG Forearm Right  Result Date: 02/06/2022 CLINICAL DATA:  Right arm wound EXAM: RIGHT FOREARM - 2 VIEW COMPARISON:  None Available. FINDINGS: There is no evidence of fracture or other focal bone lesions. There is soft tissue swelling along the volar aspect of the right forearm at the level of the mid diaphysis and distal diaphysis of the ulna. No retained radiopaque foreign body. No subjacent periosteal reaction or osseous erosion. IMPRESSION: Soft tissue swelling. No retained radiopaque foreign body. No radiographic evidence of osteomyelitis. Electronically Signed   By: Fidela Salisbury M.D.   On: 02/06/2022 20:11   CT HEAD WO CONTRAST  Result Date: 02/06/2022 CLINICAL DATA:  Tree fell on head. EXAM: CT HEAD WITHOUT CONTRAST TECHNIQUE: Contiguous axial images were obtained from the base of the skull through the vertex without intravenous contrast. RADIATION DOSE REDUCTION: This exam was performed according to the departmental  dose-optimization program which includes automated exposure control, adjustment of the mA and/or kV according to patient size and/or use of iterative reconstruction technique. COMPARISON:  None Available. FINDINGS: Brain: Small amount of pneumocephalus noted in the floor of the left anterior cranial fossa adjacent to the roof of the left orbit where there is also orbital emphysema. Findings concerning for fracture through the left orbital roof. No hemorrhage, acute infarction or hydrocephalus. Vascular: No hyperdense vessel or unexpected calcification. Skull: No acute calvarial abnormality. Sinuses/Orbits: As mentioned above, left orbital emphysema noted. Air-fluid levels in the sphenoid sinuses and left maxillary sinus. Soft tissue swelling over the left orbit and forehead. Other: None IMPRESSION: Left orbital emphysema with adjacent small amount of pneumocephalus in the adjacent left anterior cranial fossa concerning for left orbital roof fracture. No intracranial hemorrhage. Electronically Signed   By: Rolm Baptise M.D.   On: 02/06/2022 18:59   CT MAXILLOFACIAL WO CONTRAST  Result Date: 02/06/2022 CLINICAL DATA:  Tree hit patient on head. EXAM: CT MAXILLOFACIAL WITHOUT CONTRAST TECHNIQUE: Multidetector CT imaging of the maxillofacial structures was performed. Multiplanar CT image reconstructions were also generated. RADIATION DOSE REDUCTION: This exam was performed according to the departmental dose-optimization program which includes automated exposure control, adjustment of the mA and/or kV according to patient size and/or use of iterative reconstruction technique. COMPARISON:  None Available. FINDINGS: Osseous: There appears to be a nondisplaced fracture through the left inferior orbital rim extending into the left maxillary sinus and lateral wall of left maxillary sinus. No other facial fracture seen. Orbits: As noted above, nondisplaced fracture through the left inferior orbital rim. There is gas within  the left orbit superiorly. Gas is also noted passing through the left superior orbital wall into the left anterior cranial fossa. This suggests a superior orbital wall fracture although this is not well visualized. Soft tissue swelling over the left orbit. Globe is  intact. Sinuses: Fluid in the sphenoid sinuses and left maxillary sinus. Soft tissues: Soft tissue swelling over the left orbit and forehead. Limited intracranial: Small amount of pneumocephalus in the floor of the left anterior cranial fossa IMPRESSION: Although subtle, there is concern for fractures through the left inferior orbital rim extending into the lateral wall the left maxillary sinus as well as the left superior orbital wall/roof. Small amount of orbital emphysema with pneumocephalus also noted in the adjacent left anterior cranial fossa. Electronically Signed   By: Rolm Baptise M.D.   On: 02/06/2022 18:55   CT CERVICAL SPINE WO CONTRAST  Result Date: 02/06/2022 CLINICAL DATA:  Trauma. EXAM: CT CERVICAL SPINE WITHOUT CONTRAST TECHNIQUE: Multidetector CT imaging of the cervical spine was performed without intravenous contrast. Multiplanar CT image reconstructions were also generated. RADIATION DOSE REDUCTION: This exam was performed according to the departmental dose-optimization program which includes automated exposure control, adjustment of the mA and/or kV according to patient size and/or use of iterative reconstruction technique. COMPARISON:  None Available. FINDINGS: Alignment: Normal. Skull base and vertebrae: Acute fractures are seen through the vertebral bodies at T2 and T3. There is 50% loss vertebral body height at C2 and 25% loss vertebral body height at T3. There is no retropulsion of fracture fragments. No primary bone lesion or focal pathologic process. Soft tissues and spinal canal: No prevertebral fluid or swelling. No visible canal hematoma. Disc levels: Degenerative disc space narrowing and endplate osteophyte formation is  seen at C5-C6 and C6-C7. No severe central canal or neural foraminal stenosis at any level. Upper chest: Negative. Other: None. IMPRESSION: 1. Acute fractures of the T2 and T3 vertebral bodies. No retropulsion of fracture fragments. 2. No acute fracture or traumatic subluxation of the cervical spine. Electronically Signed   By: Ronney Asters M.D.   On: 02/06/2022 18:43    Procedures Procedures  LACERATION REPAIR Performed by: Carmin Muskrat Authorized by: Carmin Muskrat Consent: Verbal consent obtained. Risks and benefits: risks, benefits and alternatives were discussed Consent given by: patient Patient identity confirmed: provided demographic data Prepped and Draped in normal sterile fashion Wound explored  Laceration Location: forehead and scalp  Laceration Length: 18cm  No Foreign Bodies seen or palpated  Anesthesia: local infiltration  Local anesthetic: lidocaine 1% w epinephrine  Anesthetic total: 5 ml  Irrigation method: syringe Amount of cleaning: copious  Skin closure: 5 4-0 sutures, 12 staples    Technique: wound was gaping so closure was as approximate as possible w/o undue tension.  Patient tolerance: Patient tolerated the procedure well with no immediate complications.   Medications Ordered in ED Medications  sodium chloride 0.9 % bolus 1,000 mL (1,000 mLs Intravenous New Bag/Given 02/06/22 1809)    And  0.9 %  sodium chloride infusion (0 mLs Intravenous Hold 02/06/22 1937)  acetaminophen (TYLENOL) tablet 650 mg (has no administration in time range)  morphine (PF) 2 MG/ML injection 2-4 mg (has no administration in time range)  docusate sodium (COLACE) capsule 100 mg (has no administration in time range)  enoxaparin (LOVENOX) injection 30 mg (has no administration in time range)  oxyCODONE (Oxy IR/ROXICODONE) immediate release tablet 5 mg (has no administration in time range)  ondansetron (ZOFRAN-ODT) disintegrating tablet 4 mg (has no administration in time  range)    Or  ondansetron (ZOFRAN) injection 4 mg (has no administration in time range)  metoprolol tartrate (LOPRESSOR) injection 5 mg (has no administration in time range)  hydrALAZINE (APRESOLINE) injection 10 mg (has no administration in  time range)  fentaNYL (SUBLIMAZE) injection 50 mcg (50 mcg Intravenous Given 02/06/22 1803)  ceFAZolin (ANCEF) IVPB 2g/100 mL premix (0 g Intravenous Stopped 02/06/22 1852)  ondansetron (ZOFRAN) injection 4 mg (4 mg Intravenous Given 02/06/22 1805)  Tdap (BOOSTRIX) injection 0.5 mL (0.5 mLs Intramuscular Given 02/06/22 1803)  LORazepam (ATIVAN) injection 1 mg (1 mg Intravenous Given 02/06/22 1926)  lidocaine (PF) (XYLOCAINE) 1 % injection 30 mL (30 mLs Intradermal Given 02/06/22 2028)    ED Course/ Medical Decision Making/ A&P  This patient with a Hx of PTSD, Parkinson's, presents to the ED for concern of head trauma, this involves an extensive number of treatment options, and is a complaint that carries with it a high risk of complications and morbidity.    The differential diagnosis includes intracranial abnormality, facial bone fracture, spine fracture, CNS disruption, infection from multiple lacerations abrasions   Social Determinants of Health:  PTSD is largest inhibitor, and the patient is very anxious about evaluation in the emergency department setting.  Additional history obtained:  Additional history and/or information obtained from 3 family members who arrived at different times after the patient, and EMS note, notable for details as above and further explanation of the patient's PTSD   After the initial evaluation, orders, including: CT head, neck, face, x-rays were initiated.   Patient placed on Cardiac and Pulse-Oximetry Monitors. The patient was maintained on a cardiac monitor.  The cardiac monitored showed an rhythm of 80 sinus normal The patient was also maintained on pulse oximetry. The readings were typically 100% room air  normal   On repeat evaluation of the patient stayed the same  Lab Tests:  I personally interpreted labs.  The pertinent results include: Lactic acidosis consistent with trauma  Imaging Studies ordered:  I independently visualized and interpreted imaging which showed pneumocephalus, multiple orbit fractures, left, no intracranial hemorrhage.  Patient also found to have 2 thoracic vertebral fractures I agree with the radiologist interpretation  Consultations Obtained:  I requested consultation with the neurosurgery, ENT, trauma surgery,  and discussed lab and imaging findings as well as pertinent plan - they recommend: Admission  Dispostion / Final MDM:  After consideration of the diagnostic results and the patient's response to treatment, this adult male presenting after a tree fell onto his head, sustaining multiple injuries including facial fractures, thoracic vertebral fractures, and pneumocephalus required admission for further monitoring, management after stabilization in the ED, fluid provision, laceration repair, and consultation with multiple colleagues.    Final Clinical Impression(s) / ED Diagnoses Final diagnoses:  Trauma  Pneumocephalus  Closed fracture of second thoracic vertebra, unspecified fracture morphology, initial encounter ([place])  Facial laceration, initial encounter  CRITICAL CARE Performed by: Carmin Muskrat Total critical care time: 45 minutes Critical care time was exclusive of separately billable procedures and treating other patients. Critical care was necessary to treat or prevent imminent or life-threatening deterioration. Critical care was time spent personally by me on the following activities: development of treatment plan with patient and/or surrogate as well as nursing, discussions with consultants, evaluation of patient's response to treatment, examination of patient, obtaining history from patient or surrogate, ordering and performing treatments  and interventions, ordering and review of laboratory studies, ordering and review of radiographic studies, pulse oximetry and re-evaluation of patient's condition.    Carmin Muskrat, MD 02/06/22 2125
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
## 2022-02-06 NOTE — ED Notes (Signed)
Pt removed ccollar; EDP notified of same. Advised that c-spine cleared via CT
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
## 2022-02-06 NOTE — ED Notes (Signed)
EDP advised of pt's reported increase in [facility], continued pain & anxiety. EDP to reassess pt, place appropriate orders

## 2022-02-07 ENCOUNTER — Inpatient Hospital Stay (HOSPITAL_COMMUNITY): Payer: No Typology Code available for payment source

## 2022-02-07 LAB — BASIC METABOLIC PANEL
Anion gap: 7 (ref 5–15)
BUN: 22 mg/dL (ref 8–23)
CO2: 22 mmol/L (ref 22–32)
Calcium: 8.1 mg/dL — ABNORMAL LOW (ref 8.9–10.3)
Chloride: 111 mmol/L (ref 98–111)
Creatinine, Ser: 1.35 mg/dL — ABNORMAL HIGH (ref 0.61–1.24)
GFR, Estimated: 55 mL/min — ABNORMAL LOW (ref >60)
Glucose, Bld: 130 mg/dL — ABNORMAL HIGH (ref 70–99)
Potassium: 4.3 mmol/L (ref 3.5–5.1)
Sodium: 140 mmol/L (ref 135–145)

## 2022-02-07 LAB — CBC
HCT: 33.1 % — ABNORMAL LOW (ref 39.0–52.0)
Hemoglobin: 12.1 g/dL — ABNORMAL LOW (ref 13.0–17.0)
MCH: 34.8 pg — ABNORMAL HIGH (ref 26.0–34.0)
MCHC: 36.6 g/dL — ABNORMAL HIGH (ref 30.0–36.0)
MCV: 95.1 fL (ref 80.0–100.0)
Platelets: 117 10*3/uL — ABNORMAL LOW (ref 150–400)
RBC: 3.48 MIL/uL — ABNORMAL LOW (ref 4.22–5.81)
RDW: 13 % (ref 11.5–15.5)
WBC: 9.8 10*3/uL (ref 4.0–10.5)
nRBC: 0 % (ref 0.0–0.2)

## 2022-02-07 MED ORDER — FERROUS SULFATE 325 (65 FE) MG PO TABS
325.0000 mg | ORAL_TABLET | Freq: Every day | ORAL | Status: DC
  Administered 2022-02-08 – 2022-02-10 (×3): 325 mg via ORAL
  Filled 2022-02-07 (×3): qty 1

## 2022-02-07 MED ORDER — PANTOPRAZOLE SODIUM 40 MG PO TBEC
40.0000 mg | DELAYED_RELEASE_TABLET | Freq: Every day | ORAL | Status: DC
  Administered 2022-02-07 – 2022-02-10 (×4): 40 mg via ORAL
  Filled 2022-02-07 (×4): qty 1

## 2022-02-07 MED ORDER — CARBIDOPA-LEVODOPA 25-100 MG PO TABS
1.0000 | ORAL_TABLET | Freq: Four times a day (QID) | ORAL | Status: DC
  Administered 2022-02-07 – 2022-02-10 (×14): 1 via ORAL
  Filled 2022-02-07 (×14): qty 1

## 2022-02-07 MED ORDER — CALCIUM GLUCONATE-NACL 2-0.675 GM/100ML-% IV SOLN
2.0000 g | Freq: Once | INTRAVENOUS | Status: AC
  Administered 2022-02-07: 2000 mg via INTRAVENOUS
  Filled 2022-02-07: qty 100

## 2022-02-07 MED ORDER — LORATADINE 10 MG PO TABS
10.0000 mg | ORAL_TABLET | Freq: Every day | ORAL | Status: DC
  Administered 2022-02-07 – 2022-02-10 (×4): 10 mg via ORAL
  Filled 2022-02-07 (×4): qty 1

## 2022-02-07 MED ORDER — SODIUM CHLORIDE 0.9 % IV BOLUS
500.0000 mL | Freq: Once | INTRAVENOUS | Status: AC
  Administered 2022-02-07: 500 mL via INTRAVENOUS

## 2022-02-07 MED ORDER — IOHEXOL 300 MG/ML  SOLN
85.0000 mL | Freq: Once | INTRAMUSCULAR | Status: AC | PRN
  Administered 2022-02-07: 85 mL via INTRAVENOUS

## 2022-02-07 MED ORDER — LEVETIRACETAM IN NACL 500 MG/100ML IV SOLN
500.0000 mg | Freq: Two times a day (BID) | INTRAVENOUS | Status: DC
  Administered 2022-02-07: 500 mg via INTRAVENOUS
  Filled 2022-02-07: qty 100

## 2022-02-07 NOTE — Progress Notes (Signed)
Patient ID: Randall Moore., male   DOB: 1946/06/25, 76 y.o.   MRN: [id number] BP 112/61 (BP Location: Left Arm)   Pulse 94   Temp 99.1 F (37.3 C) (Oral)   Resp 11   Ht 6' (1.829 m)   Wt 79.4 kg   SpO2 95%   BMI 23.73 kg/m  Alert and oriented x 4, speech is clear and fluent Moving all extremities Perrl, full eom Tongue and uvula midline Changes on CT are minimal.  Will discontinue keppra, exam is unchanged
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
## 2022-02-07 NOTE — Plan of Care (Signed)
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
## 2022-02-07 NOTE — Progress Notes (Addendum)
Notified by patient's RN about results of this afternoon's CT, with notable new findings: 1. Interval blooming of a hemorrhagic parenchymal contusion within the anterolateral right frontal lobe. The focus of hemorrhage at this site measures 7 mm. 2. Acute non-depressed/non-displaced fracture of the left frontal calvarium, extending into the left orbital roof.  3. Acute Non-displaced fracture within the left inferior orbital and anterior wall of the left maxillary sinus. 4. Persistent small foci of gas within the superior left orbit (abutting the orbital roof fracture). 5. Left orbital hematoma. 6. Scalp hematomas. Frontal scalp laceration with skin staples. 7. Paranasal sinus disease, as described.   Patient has been complaining of headache but no neurologic deficits at this time.  -D/C lovenox -prophylactic keppra ordered -Neurosurgery on call (Josh McDaniels) notified

## 2022-02-08 LAB — BASIC METABOLIC PANEL
Anion gap: 7 (ref 5–15)
BUN: 14 mg/dL (ref 8–23)
CO2: 23 mmol/L (ref 22–32)
Calcium: 7.7 mg/dL — ABNORMAL LOW (ref 8.9–10.3)
Chloride: 107 mmol/L (ref 98–111)
Creatinine, Ser: 1.03 mg/dL (ref 0.61–1.24)
GFR, Estimated: >60 mL/min (ref >60)
Glucose, Bld: 94 mg/dL (ref 70–99)
Potassium: 3.8 mmol/L (ref 3.5–5.1)
Sodium: 137 mmol/L (ref 135–145)

## 2022-02-08 LAB — CBC
HCT: 28.9 % — ABNORMAL LOW (ref 39.0–52.0)
Hemoglobin: 10 g/dL — ABNORMAL LOW (ref 13.0–17.0)
MCH: 33.4 pg (ref 26.0–34.0)
MCHC: 34.6 g/dL (ref 30.0–36.0)
MCV: 96.7 fL (ref 80.0–100.0)
Platelets: 106 10*3/uL — ABNORMAL LOW (ref 150–400)
RBC: 2.99 MIL/uL — ABNORMAL LOW (ref 4.22–5.81)
RDW: 13.1 % (ref 11.5–15.5)
WBC: 6.2 10*3/uL (ref 4.0–10.5)
nRBC: 0 % (ref 0.0–0.2)

## 2022-02-08 MED ORDER — CALCIUM GLUCONATE-NACL 2-0.675 GM/100ML-% IV SOLN
2.0000 g | Freq: Once | INTRAVENOUS | Status: AC
  Administered 2022-02-08: 2000 mg via INTRAVENOUS
  Filled 2022-02-08: qty 100

## 2022-02-08 MED ORDER — ACETAMINOPHEN 500 MG PO TABS
1000.0000 mg | ORAL_TABLET | Freq: Four times a day (QID) | ORAL | Status: DC
  Administered 2022-02-08 – 2022-02-10 (×9): 1000 mg via ORAL
  Filled 2022-02-08 (×9): qty 2

## 2022-02-08 MED ORDER — METHOCARBAMOL 500 MG PO TABS
1000.0000 mg | ORAL_TABLET | Freq: Three times a day (TID) | ORAL | Status: DC
  Administered 2022-02-08 – 2022-02-10 (×8): 1000 mg via ORAL
  Filled 2022-02-08 (×8): qty 2
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
## 2022-02-08 NOTE — Progress Notes (Signed)
Pharmacy Electrolyte Replacement  Recent Labs:  Recent Labs    02/08/22 0304  K 3.8  CREATININE 1.03  Calcium 7.7, albumin 4.3   Plan: Ca gluconate 2gm IV x 1   Jisel Fleet D. Mina Marble, PharmD, BCPS, [place] 02/08/2022, 12:49 PM
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
## 2022-02-09 MED ORDER — METHOCARBAMOL 750 MG PO TABS: 750.0000 mg | ORAL_TABLET | Freq: Four times a day (QID) | ORAL | 1 refills | Status: DC

## 2022-02-09 MED ORDER — ACETAMINOPHEN 500 MG PO TABS: 1000.0000 mg | ORAL_TABLET | Freq: Four times a day (QID) | ORAL | 1 refills | Status: DC

## 2022-02-09 MED ORDER — OXYCODONE HCL 5 MG PO TABS: 2.5000 mg | ORAL_TABLET | ORAL | 0 refills | Status: DC | PRN
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
## 2022-02-10 NOTE — Progress Notes (Signed)
Pt discharge education and instructions completed with pt and family at bedside. All voices understanding and denies any questions. Pt IV and telemetry removed prior to discharge. Pt to pick up electronically sent prescriptions from preferred pharmacy on file. Pt head dsg remains unremarkable and intact. Pt to be transported off unit via wheelchair with belongings and family at side. Pt discharge home with family to transport him off to disposition. Delia Heady RN
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
## 2022-02-26 ENCOUNTER — Ambulatory Visit: Payer: 59 | Admitting: Cardiology

## 2022-02-26 ENCOUNTER — Encounter: Payer: Self-pay | Admitting: Cardiology

## 2022-02-26 VITALS — BP 138/80 | HR 82 | Temp 98.6°F | Ht 72.0 in | Wt 181.0 lb

## 2022-02-26 DIAGNOSIS — I2510 Atherosclerotic heart disease of native coronary artery without angina pectoris: Secondary | ICD-10-CM

## 2022-02-26 DIAGNOSIS — I10 Essential (primary) hypertension: Secondary | ICD-10-CM

## 2022-02-26 DIAGNOSIS — E782 Mixed hyperlipidemia: Secondary | ICD-10-CM

## 2022-02-26 MED ORDER — ISOSORBIDE MONONITRATE ER 30 MG PO TB24: 30.0000 mg | ORAL_TABLET | Freq: Every day | ORAL | 0 refills | Status: DC
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
## 2022-04-03 ENCOUNTER — Telehealth: Payer: Self-pay | Admitting: Cardiology

## 2022-04-03 DIAGNOSIS — I10 Essential (primary) hypertension: Secondary | ICD-10-CM

## 2022-04-03 NOTE — Telephone Encounter (Signed)
Called and spoke to patient he stated this morning his blood pressure was 143/89 I went ahead and scheduled patient to come in for a ov. Patient was advised to continue monitoring and keep a log and to restart amlodipine 5 mg daily per Dr. Terri Skains patient voiced understanding.

## 2022-04-03 NOTE — Telephone Encounter (Signed)
Please have him keep his BP log for the next week.  IF the numbers are consistently greater than 168mHG have him give [place] call sooner.  Otherwise follow up in 2 weeks for re-evaluation.   Dr. TTerri Skains

## 2022-04-03 NOTE — Telephone Encounter (Signed)
Received a call from the patient and wife on 04/02/2022 night hat SBP has been as high as 180 mmHg. No chest pain, shortness of breath, headache, vision changes.   Current Outpatient Medications:    acetaminophen (TYLENOL) 500 MG tablet, Take 2 tablets (1,000 mg total) by mouth every 6 (six) hours., Disp: 120 tablet, Rfl: 1   aspirin EC 81 MG tablet, Take 81 mg by mouth every morning. , Disp: , Rfl:    atorvastatin (LIPITOR) 80 MG tablet, Take 80 mg by mouth every evening., Disp: , Rfl:    B Complex-C (SUPER B-C PO), Take 1 tablet by mouth daily., Disp: , Rfl:    CALCIUM PO, Take 600 mg by mouth daily. Vitamin D, Disp: , Rfl:    carbidopa-levodopa (SINEMET IR) 25-100 MG tablet, Take 1 tablet by mouth 4 (four) times daily., Disp: , Rfl:    desloratadine (CLARINEX) 5 MG tablet, Take 5 mg by mouth 2 (two) times a day. , Disp: , Rfl:    EPINEPHrine 0.3 mg/0.3 mL IJ SOAJ injection, Inject 0.3 mg into the muscle as needed for anaphylaxis. , Disp: , Rfl:    ferrous sulfate 325 (65 FE) MG tablet, Take 325 mg by mouth daily with breakfast., Disp: , Rfl:    isosorbide mononitrate (IMDUR) 30 MG 24 hr tablet, Take 1 tablet (30 mg total) by mouth daily., Disp: 90 tablet, Rfl: 0   nitroGLYCERIN (NITROSTAT) 0.4 MG SL tablet, Place 0.4 mg under the tongue every 5 (five) minutes as needed for chest pain. , Disp: , Rfl:    Omega-3 Fatty Acids (OMEGA-3 PO), Take 900 mg by mouth daily. 1400 mg fish oil, Disp: , Rfl:    omeprazole (PRILOSEC) 40 MG capsule, Take 40 mg by mouth daily., Disp: , Rfl:    Antihypertensive medications recently discontinued after recent admission with tree fall related injury. Asked him to take 1 pill of amlodipine 5 mg and continue to keep a log. Will defer further adjustment to Dr. Terri Skains.  Time spent: 5 min   Nigel Mormon, MD Pager: [phone] Office: [phone]

## 2022-04-08 ENCOUNTER — Ambulatory Visit: Payer: 59 | Admitting: Cardiology

## 2022-04-08 ENCOUNTER — Encounter: Payer: Self-pay | Admitting: Cardiology

## 2022-04-08 VITALS — BP 150/92 | HR 87 | Temp 98.0°F | Resp 16 | Ht 72.0 in | Wt 178.8 lb

## 2022-04-08 DIAGNOSIS — I340 Nonrheumatic mitral (valve) insufficiency: Secondary | ICD-10-CM

## 2022-04-08 DIAGNOSIS — G20 Parkinson's disease: Secondary | ICD-10-CM

## 2022-04-08 DIAGNOSIS — G20A1 Parkinson's disease without dyskinesia, without mention of fluctuations: Secondary | ICD-10-CM

## 2022-04-08 DIAGNOSIS — E782 Mixed hyperlipidemia: Secondary | ICD-10-CM

## 2022-04-08 DIAGNOSIS — Z87891 Personal history of nicotine dependence: Secondary | ICD-10-CM

## 2022-04-08 DIAGNOSIS — I10 Essential (primary) hypertension: Secondary | ICD-10-CM

## 2022-04-08 DIAGNOSIS — I2510 Atherosclerotic heart disease of native coronary artery without angina pectoris: Secondary | ICD-10-CM

## 2022-04-08 DIAGNOSIS — I351 Nonrheumatic aortic (valve) insufficiency: Secondary | ICD-10-CM

## 2022-04-08 MED ORDER — CARVEDILOL 6.25 MG PO TABS: 6.2500 mg | ORAL_TABLET | Freq: Two times a day (BID) | ORAL | 3 refills | Status: DC
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
## 2022-05-07 ENCOUNTER — Other Ambulatory Visit: Payer: Self-pay | Admitting: Cardiology

## 2022-07-28 ENCOUNTER — Other Ambulatory Visit: Payer: Self-pay | Admitting: Urology

## 2022-07-29 ENCOUNTER — Other Ambulatory Visit: Payer: Self-pay | Admitting: Urology

## 2022-07-30 NOTE — Progress Notes (Signed)
Sent message, via epic in basket, requesting orders in epic from surgeon.  

## 2022-08-01 NOTE — Progress Notes (Signed)
COVID Vaccine Completed: yes  Date of COVID positive in last 90 days:  PCP - Kathryne Eriksson, MD Cardiologist - Rex Kras, MD  Chest x-ray - CT 02/07/22 Epic EKG - 02/26/22 Epic Stress Test -  ECHO - 08/21/21 Epic Cardiac Cath - 02/05/21 Epic Pacemaker/ICD device last checked: Spinal Cord Stimulator:  Bowel Prep -   Sleep Study -  CPAP -   Fasting Blood Sugar -  Checks Blood Sugar _____ times a day  Last dose of GLP1 agonist-  N/A GLP1 instructions:  N/A   Last dose of SGLT-2 inhibitors-  N/A SGLT-2 instructions: N/A   Blood Thinner Instructions: Aspirin Instructions: ASA 81 Last Dose:  Activity level:  Can go up a flight of stairs and perform activities of daily living without stopping and without symptoms of chest pain or shortness of breath.  Able to exercise without symptoms  Unable to go up a flight of stairs without symptoms of     Anesthesia review: HTN, CAD, need cardiac clearance?, parkinson, thrombocytopenia   Patient denies shortness of breath, fever, cough and chest pain at PAT appointment  Patient verbalized understanding of instructions that were given to them at the PAT appointment. Patient was also instructed that they will need to review over the PAT instructions again at home before surgery.

## 2022-08-05 NOTE — Patient Instructions (Signed)
SURGICAL WAITING ROOM VISITATION  Patients having surgery or a procedure may have no more than 2 support people in the waiting area - these visitors may rotate.    Children under the age of 55 must have an adult with them who is not the patient.  Due to an increase in RSV and influenza rates and associated hospitalizations, children ages 61 and under may not visit patients in [place].  If the patient needs to stay at the hospital during part of their recovery, the visitor guidelines for inpatient rooms apply. Pre-op nurse will coordinate an appropriate time for 1 support person to accompany patient in pre-op.  This support person may not rotate.    Please refer to the [facility] website for the visitor guidelines for Inpatients (after your surgery is over and you are in a regular room).     Your procedure is scheduled on: 08/08/22   Report to [facility] Main Entrance    Report to admitting at 7:15 AM   Call this number if you have problems the morning of surgery [phone]   Do not eat food or drink liquids :After Midnight.          If you have questions, please contact your surgeon's office.   FOLLOW BOWEL PREP AND ANY ADDITIONAL PRE OP INSTRUCTIONS YOU RECEIVED FROM YOUR SURGEON'S OFFICE!!!     Oral Hygiene is also important to reduce your risk of infection.                                    Remember - BRUSH YOUR TEETH THE MORNING OF SURGERY WITH YOUR REGULAR TOOTHPASTE  DENTURES WILL BE REMOVED PRIOR TO SURGERY PLEASE DO NOT APPLY "Poly grip" OR ADHESIVES!!!   Take these medicines the morning of surgery with A SIP OF WATER: Tylenol, Amlodipine, Carbidopa-Levodopa, Carvedilol, Isosorbide, Omeprazole, Zofran, Oxycodone.                               You may not have any metal on your body including jewelry, and body piercing             Do not wear lotions, powders, cologne, or deodorant              Men may shave face and neck.   Do not bring  valuables to the hospital. [place].   Contacts, glasses, dentures or bridgework may not be worn into surgery.  DO NOT [place]. PHARMACY WILL DISPENSE MEDICATIONS LISTED ON YOUR MEDICATION LIST TO YOU DURING YOUR ADMISSION [place]!    Patients discharged on the day of surgery will not be allowed to drive home.  Someone NEEDS to stay with you for the first 24 hours after anesthesia.              Please read over the following fact sheets you were given: IF [place] [phone] Schneiders    If you received a COVID test during your pre-op visit  it is requested that you wear a mask when out in public, stay away from anyone that may not be feeling well and notify your surgeon if you develop symptoms. If  you test positive for Covid or have been in contact with anyone that has tested positive in the last 10 days please notify you surgeon.    [place] - Preparing for Surgery Before surgery, you can play an important role.  Because skin is not sterile, your skin needs to be as free of germs as possible.  You can reduce the number of germs on your skin by washing with CHG (chlorahexidine gluconate) soap before surgery.  CHG is an antiseptic cleaner which kills germs and bonds with the skin to continue killing germs even after washing. Please DO NOT use if you have an allergy to CHG or antibacterial soaps.  If your skin becomes reddened/irritated stop using the CHG and inform your nurse when you arrive at Short Stay. Do not shave (including legs and underarms) for at least 48 hours prior to the first CHG shower.  You may shave your face/neck.  Please follow these instructions carefully:  1.  Shower with CHG Soap the night before surgery and the  morning of surgery.  2.  If you choose to wash your hair, wash your hair first as usual with your normal   shampoo.  3.  After you shampoo, rinse your hair and body thoroughly to remove the shampoo.                             4.  Use CHG as you would any other liquid soap.  You can apply chg directly to the skin and wash.  Gently with a scrungie or clean washcloth.  5.  Apply the CHG Soap to your body ONLY FROM THE NECK DOWN.   Do   not use on face/ open                           Wound or open sores. Avoid contact with eyes, ears mouth and   genitals (private parts).                       Wash face,  Genitals (private parts) with your normal soap.             6.  Wash thoroughly, paying special attention to the area where your    surgery  will be performed.  7.  Thoroughly rinse your body with warm water from the neck down.  8.  DO NOT shower/wash with your normal soap after using and rinsing off the CHG Soap.                9.  Pat yourself dry with a clean towel.            10.  Wear clean pajamas.            11.  Place clean sheets on your bed the night of your first shower and do not  sleep with pets. Day of Surgery : Do not apply any lotions/deodorants the morning of surgery.  Please wear clean clothes to the hospital/surgery center.  FAILURE TO FOLLOW THESE INSTRUCTIONS MAY RESULT IN THE CANCELLATION OF YOUR SURGERY  PATIENT SIGNATURE_________________________________  NURSE SIGNATURE__________________________________  ________________________________________________________________________

## 2022-08-06 ENCOUNTER — Encounter (HOSPITAL_COMMUNITY)
Admission: RE | Admit: 2022-08-06 | Discharge: 2022-08-06 | Disposition: A | Discharge status: Home / Self Care | Payer: 59 | Source: Ambulatory Visit | Attending: Urology | Admitting: Urology

## 2022-08-06 ENCOUNTER — Encounter (HOSPITAL_COMMUNITY): Payer: Self-pay

## 2022-08-06 VITALS — BP 104/70 | HR 75 | Temp 98.5°F | Resp 16 | Ht 72.0 in | Wt 178.0 lb

## 2022-08-06 DIAGNOSIS — I10 Essential (primary) hypertension: Secondary | ICD-10-CM | POA: Insufficient documentation

## 2022-08-06 DIAGNOSIS — I2510 Atherosclerotic heart disease of native coronary artery without angina pectoris: Secondary | ICD-10-CM | POA: Insufficient documentation

## 2022-08-06 DIAGNOSIS — Z01812 Encounter for preprocedural laboratory examination: Secondary | ICD-10-CM | POA: Insufficient documentation

## 2022-08-06 DIAGNOSIS — Z87891 Personal history of nicotine dependence: Secondary | ICD-10-CM | POA: Diagnosis not present

## 2022-08-06 DIAGNOSIS — N201 Calculus of ureter: Secondary | ICD-10-CM | POA: Insufficient documentation

## 2022-08-06 HISTORY — DX: Personal history of urinary calculi: Z87.442

## 2022-08-06 HISTORY — DX: Essential (primary) hypertension: I10

## 2022-08-06 HISTORY — DX: Gastro-esophageal reflux disease without esophagitis: K21.9

## 2022-08-06 HISTORY — DX: Atherosclerotic heart disease of native coronary artery without angina pectoris: I25.10

## 2022-08-06 LAB — CBC
HCT: 43 % (ref 39.0–52.0)
Hemoglobin: 15.1 g/dL (ref 13.0–17.0)
MCH: 34.1 pg — ABNORMAL HIGH (ref 26.0–34.0)
MCHC: 35.1 g/dL (ref 30.0–36.0)
MCV: 97.1 fL (ref 80.0–100.0)
Platelets: 162 10*3/uL (ref 150–400)
RBC: 4.43 MIL/uL (ref 4.22–5.81)
RDW: 12.4 % (ref 11.5–15.5)
WBC: 4.4 10*3/uL (ref 4.0–10.5)
nRBC: 0 % (ref 0.0–0.2)

## 2022-08-06 LAB — BASIC METABOLIC PANEL
Anion gap: 5 (ref 5–15)
BUN: 20 mg/dL (ref 8–23)
CO2: 25 mmol/L (ref 22–32)
Calcium: 8.9 mg/dL (ref 8.9–10.3)
Chloride: 111 mmol/L (ref 98–111)
Creatinine, Ser: 1.22 mg/dL (ref 0.61–1.24)
GFR, Estimated: >60 mL/min (ref >60)
Glucose, Bld: 112 mg/dL — ABNORMAL HIGH (ref 70–99)
Potassium: 4 mmol/L (ref 3.5–5.1)
Sodium: 141 mmol/L (ref 135–145)
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
## 2022-08-08 ENCOUNTER — Ambulatory Visit (HOSPITAL_BASED_OUTPATIENT_CLINIC_OR_DEPARTMENT_OTHER): Payer: 59 | Admitting: Certified Registered Nurse Anesthetist

## 2022-08-08 ENCOUNTER — Encounter (HOSPITAL_COMMUNITY): Payer: Self-pay | Admitting: Urology

## 2022-08-08 ENCOUNTER — Encounter (HOSPITAL_COMMUNITY)
Admission: RE | Disposition: A | Discharge status: Home / Self Care | Payer: Self-pay | Source: Ambulatory Visit | Attending: Urology

## 2022-08-08 ENCOUNTER — Ambulatory Visit (HOSPITAL_COMMUNITY): Payer: 59

## 2022-08-08 ENCOUNTER — Other Ambulatory Visit: Payer: Self-pay

## 2022-08-08 ENCOUNTER — Ambulatory Visit (HOSPITAL_COMMUNITY): Payer: 59 | Admitting: Physician Assistant

## 2022-08-08 ENCOUNTER — Ambulatory Visit (HOSPITAL_COMMUNITY)
Admission: RE | Admit: 2022-08-08 | Discharge: 2022-08-08 | Disposition: A | Discharge status: Home / Self Care | Payer: 59 | Source: Ambulatory Visit | Attending: Urology | Admitting: Urology

## 2022-08-08 DIAGNOSIS — Z87891 Personal history of nicotine dependence: Secondary | ICD-10-CM

## 2022-08-08 DIAGNOSIS — I2510 Atherosclerotic heart disease of native coronary artery without angina pectoris: Secondary | ICD-10-CM

## 2022-08-08 DIAGNOSIS — K219 Gastro-esophageal reflux disease without esophagitis: Secondary | ICD-10-CM | POA: Diagnosis not present

## 2022-08-08 DIAGNOSIS — N201 Calculus of ureter: Secondary | ICD-10-CM | POA: Diagnosis not present

## 2022-08-08 DIAGNOSIS — N202 Calculus of kidney with calculus of ureter: Secondary | ICD-10-CM | POA: Insufficient documentation

## 2022-08-08 DIAGNOSIS — I10 Essential (primary) hypertension: Secondary | ICD-10-CM | POA: Insufficient documentation

## 2022-08-08 HISTORY — PX: CYSTOSCOPY/URETEROSCOPY/HOLMIUM LASER/STENT PLACEMENT: SHX6546

## 2022-08-08 SURGERY — CYSTOSCOPY/URETEROSCOPY/HOLMIUM LASER/STENT PLACEMENT
Anesthesia: General | Site: Ureter | Laterality: Left

## 2022-08-08 MED ORDER — ORAL CARE MOUTH RINSE: 15.0000 mL | Freq: Once | OROMUCOSAL | Status: AC

## 2022-08-08 MED ORDER — LIDOCAINE HCL (PF) 2 % IJ SOLN
INTRAMUSCULAR | Status: AC
  Filled 2022-08-08: qty 5

## 2022-08-08 MED ORDER — IOHEXOL 300 MG/ML  SOLN
INTRAMUSCULAR | Status: DC | PRN
  Administered 2022-08-08: 8 mL

## 2022-08-08 MED ORDER — PROPOFOL 10 MG/ML IV BOLUS
INTRAVENOUS | Status: DC | PRN
  Administered 2022-08-08: 200 mg via INTRAVENOUS

## 2022-08-08 MED ORDER — HYDROMORPHONE HCL 1 MG/ML IJ SOLN
0.2500 mg | INTRAMUSCULAR | Status: DC | PRN
  Administered 2022-08-08: 0.5 mg via INTRAVENOUS

## 2022-08-08 MED ORDER — ONDANSETRON HCL 4 MG/2ML IJ SOLN
INTRAMUSCULAR | Status: AC
  Filled 2022-08-08: qty 2

## 2022-08-08 MED ORDER — LACTATED RINGERS IV SOLN: INTRAVENOUS | Status: DC

## 2022-08-08 MED ORDER — ACETAMINOPHEN 500 MG PO TABS
1000.0000 mg | ORAL_TABLET | ORAL | Status: AC | PRN
  Administered 2022-08-08: 1000 mg via ORAL

## 2022-08-08 MED ORDER — FENTANYL CITRATE (PF) 100 MCG/2ML IJ SOLN
INTRAMUSCULAR | Status: DC | PRN
  Administered 2022-08-08 (×2): 50 ug via INTRAVENOUS

## 2022-08-08 MED ORDER — DEXAMETHASONE SODIUM PHOSPHATE 10 MG/ML IJ SOLN
INTRAMUSCULAR | Status: AC
  Filled 2022-08-08: qty 1

## 2022-08-08 MED ORDER — GENTAMICIN SULFATE 40 MG/ML IJ SOLN
5.0000 mg/kg | Freq: Once | INTRAVENOUS | Status: AC
  Administered 2022-08-08: 400 mg via INTRAVENOUS
  Filled 2022-08-08: qty 10

## 2022-08-08 MED ORDER — HYDROMORPHONE HCL 1 MG/ML IJ SOLN
INTRAMUSCULAR | Status: DC | PRN
  Administered 2022-08-08: .5 mg via INTRAVENOUS
  Administered 2022-08-08: 1 mg via INTRAVENOUS
  Administered 2022-08-08: .5 mg via INTRAVENOUS

## 2022-08-08 MED ORDER — OXYCODONE-ACETAMINOPHEN 5-325 MG PO TABS: 1.0000 | ORAL_TABLET | ORAL | 0 refills | Status: DC | PRN

## 2022-08-08 MED ORDER — MIDAZOLAM HCL 2 MG/2ML IJ SOLN
INTRAMUSCULAR | Status: DC | PRN
  Administered 2022-08-08: 2 mg via INTRAVENOUS

## 2022-08-08 MED ORDER — FENTANYL CITRATE (PF) 100 MCG/2ML IJ SOLN
INTRAMUSCULAR | Status: AC
  Filled 2022-08-08: qty 2

## 2022-08-08 MED ORDER — OXYCODONE HCL 5 MG/5ML PO SOLN: 5.0000 mg | Freq: Once | ORAL | Status: AC | PRN

## 2022-08-08 MED ORDER — PROMETHAZINE HCL 25 MG/ML IJ SOLN: 6.2500 mg | INTRAMUSCULAR | Status: DC | PRN

## 2022-08-08 MED ORDER — CHLORHEXIDINE GLUCONATE 0.12 % MT SOLN
15.0000 mL | Freq: Once | OROMUCOSAL | Status: AC
  Administered 2022-08-08: 15 mL via OROMUCOSAL

## 2022-08-08 MED ORDER — OXYCODONE HCL 5 MG PO TABS
5.0000 mg | ORAL_TABLET | Freq: Once | ORAL | Status: AC | PRN
  Administered 2022-08-08: 5 mg via ORAL

## 2022-08-08 MED ORDER — ACETAMINOPHEN 500 MG PO TABS
ORAL_TABLET | ORAL | Status: AC
  Filled 2022-08-08: qty 2

## 2022-08-08 MED ORDER — LIDOCAINE 2% (20 MG/ML) 5 ML SYRINGE
INTRAMUSCULAR | Status: DC | PRN
  Administered 2022-08-08: 60 mg via INTRAVENOUS

## 2022-08-08 MED ORDER — HYDROMORPHONE HCL 2 MG/ML IJ SOLN
INTRAMUSCULAR | Status: AC
  Filled 2022-08-08: qty 1

## 2022-08-08 MED ORDER — DOCUSATE SODIUM 100 MG PO CAPS: 100.0000 mg | ORAL_CAPSULE | Freq: Every day | ORAL | 0 refills | Status: DC | PRN

## 2022-08-08 MED ORDER — PROPOFOL 500 MG/50ML IV EMUL
INTRAVENOUS | Status: AC
  Filled 2022-08-08: qty 50

## 2022-08-08 MED ORDER — AMISULPRIDE (ANTIEMETIC) 5 MG/2ML IV SOLN: 10.0000 mg | Freq: Once | INTRAVENOUS | Status: DC | PRN

## 2022-08-08 MED ORDER — ACETAMINOPHEN 160 MG/5ML PO SOLN: 325.0000 mg | ORAL | Status: AC | PRN

## 2022-08-08 MED ORDER — SODIUM CHLORIDE 0.9 % IR SOLN
Status: DC | PRN
  Administered 2022-08-08: 6000 mL

## 2022-08-08 MED ORDER — ONDANSETRON HCL 4 MG/2ML IJ SOLN
INTRAMUSCULAR | Status: DC | PRN
  Administered 2022-08-08: 4 mg via INTRAVENOUS

## 2022-08-08 MED ORDER — DEXAMETHASONE SODIUM PHOSPHATE 4 MG/ML IJ SOLN
INTRAMUSCULAR | Status: DC | PRN
  Administered 2022-08-08: 5 mg via INTRAVENOUS

## 2022-08-08 MED ORDER — MIDAZOLAM HCL 2 MG/2ML IJ SOLN
INTRAMUSCULAR | Status: AC
  Filled 2022-08-08: qty 2

## 2022-08-08 MED ORDER — HYDROMORPHONE HCL 1 MG/ML IJ SOLN
INTRAMUSCULAR | Status: AC
  Filled 2022-08-08: qty 1

## 2022-08-08 MED ORDER — OXYCODONE HCL 5 MG PO TABS
ORAL_TABLET | ORAL | Status: AC
  Filled 2022-08-08: qty 1

## 2022-08-08 SURGICAL SUPPLY — 32 items
APL SKNCLS STERI-STRIP NONHPOA (GAUZE/BANDAGES/DRESSINGS)
BAG DRN RND TRDRP ANRFLXCHMBR (UROLOGICAL SUPPLIES) ×1
BAG URINE DRAIN 2000ML AR STRL (UROLOGICAL SUPPLIES) ×1
BAG URO CATCHER STRL LF (MISCELLANEOUS) ×1
BASKET ZERO TIP NITINOL 2.4FR (BASKET) ×1
BENZOIN TINCTURE PRP APPL 2/3 (GAUZE/BANDAGES/DRESSINGS)
BSKT STON RTRVL ZERO TP 2.4FR (BASKET) ×1
CATH FOLEY 2WAY SLVR  5CC 18FR (CATHETERS) ×1
CATH FOLEY 2WAY SLVR 5CC 18FR (CATHETERS) ×1
CATH ROBINSON RED A/P 12FR (CATHETERS) ×1
CATH URETERAL DUAL LUMEN 10F (MISCELLANEOUS) ×1
CATH URETL OPEN 5X70 (CATHETERS) ×1
CLOTH BEACON ORANGE TIMEOUT ST (SAFETY) ×1
DRSG TEGADERM 2-3/8X2-3/4 SM (GAUZE/BANDAGES/DRESSINGS)
FIBER LASER MOSES 200 DFL (Laser)
GLOVE BIOGEL M 7.0 STRL (GLOVE) ×1
GOWN STRL REUS W/ TWL XL LVL3 (GOWN DISPOSABLE) ×1
GOWN STRL REUS W/TWL XL LVL3 (GOWN DISPOSABLE) ×1
GUIDEWIRE STR DUAL SENSOR (WIRE) ×2
GUIDEWIRE ZIPWRE .038 STRAIGHT (WIRE)
KIT TURNOVER KIT A (KITS)
LASER FIB FLEXIVA PULSE ID 365 (Laser)
LOOP CUT BIPOLAR 24F LRG (ELECTROSURGICAL) ×1
MANIFOLD NEPTUNE II (INSTRUMENTS) ×1
PACK CYSTO (CUSTOM PROCEDURE TRAY) ×1
SHEATH NAVIGATOR HD 12/14X46 (SHEATH)
STENT URET 6FRX28 CONTOUR (STENTS) ×1
SYR TOOMEY IRRIG 70ML (MISCELLANEOUS) ×1
TRACTIP FLEXIVA PULS ID 200XHI (Laser) ×1
TRACTIP FLEXIVA PULSE ID 200 (Laser) ×1
TUBING CONNECTING 10 (TUBING) ×1
TUBING UROLOGY SET (TUBING) ×1

## 2022-08-08 NOTE — Transfer of Care (Signed)
Immediate Anesthesia Transfer of Care Note  Patient: Randall Moore.  Procedure(s) Performed: CYSTOSCOPYLEFT RETROGRADE PYELOGRAM/LEFT URETEROSCOPY/HOLMIUM LASER/LEFT STENT PLACEMENT; TRANSURETHRAL FULGARATION OF PROSTATE (Left: Ureter)  Patient Location: PACU  Anesthesia Type:General  Level of Consciousness: awake, alert , and oriented  Airway & Oxygen Therapy: Patient Spontanous Breathing and Patient connected to face mask oxygen  Post-op Assessment: Report given to RN  Post vital signs: Reviewed and stable  Last Vitals:  Vitals Value Taken Time  BP 136/85 08/08/22 1045  Temp    Pulse 81 08/08/22 1046  Resp 11 08/08/22 1046  SpO2 96 % 08/08/22 1046  Vitals shown include unvalidated device data.  Last Pain:  Vitals:   08/08/22 0739  TempSrc:   PainSc: 0-No pain         Complications: No notable events documented.

## 2022-08-08 NOTE — Op Note (Signed)
Operative Note  Preoperative diagnosis:  1.  Left ureteral and renal stone  Postoperative diagnosis: 1.  Left ureteral and renal stone  Procedure(s): 1.  Cystoscopy 2. Left ureteroscopy with laser lithotripsy and basket extraction of stones 3. Left retrograde pyelogram 4. Left ureteral stent placement 5. Fluoroscopy with intraoperative interpretation 6.  Fulguration of prostate  Surgeon: Rexene Alberts, MD  Assistants:  None  Anesthesia:  General  Complications:  None  EBL:  Minimal  Specimens: 1. Stones for stone analysis (to be done at [facility])  Drains/Catheters: 1.  Left 6Fr x 28cm ureteral stent without tether string  Intraoperative findings:   Cystoscopy demonstrated trilobar obstructing prostate with very large median lobe protruding into the bladder with an intravesical part component.  No suspicious bladder lesions.  Bilateral ureteral orifices in normal orthotopic position. Left retrograde pyelogram demonstrated no hydronephrosis, no extravasation of contrast and no filling defects. Left ureteroscopy demonstrated an impacted distal left ureteral stone measuring approximately 8 mm that was successfully fragmented and all stones basket extracted.  This also revealed a 5 mm left lower pole stone that was fragmented and basket extracted. Successful stent placement. Friable trilobar prostate that was fulgurated at the end of the case and Foley catheter placed.  Indication:  Randall Moore. is a 76 y.o. male with history of a left ureteral and left renal stone here for definitive treatment.  Description of procedure: After informed consent was obtained from the patient, the patient was identified and taken to the operating room and placed in the supine position.  General anesthesia was administered as well as perioperative IV antibiotics.  At the beginning of the case, a time-out was performed to properly identify the patient, the surgery to be performed, and  the surgical site.  Sequential compression devices were applied to the the lower extremities at the beginning of the case for DVT prophylaxis.  The patient was then placed in the dorsal lithotomy supine position, prepped and draped in sterile fashion.  Preliminary scout fluoroscopy revealed that there was a 8 mm calcification area at the left distal ureter, which corresponds to the stone found on the preoperative CT scan. We then passed the 21-French rigid cystoscope through the urethra and into the bladder under vision without any difficulty , noting a normal urethra without strictures and a moderately obstructing prostate.  Of note, the prostate was friable and was bleeding at the intravesical component.  A systematic evaluation of the bladder revealed no evidence of any suspicious bladder lesions.  Ureteral orifices were in normal position.    Under cystoscopic and flouroscopic guidance, we cannulated the left ureteral orifice with a 5-French open-ended ureteral catheter and a gentle retrograde pyelogram was performed, revealing a normal caliber ureter without any filling defects. There was no hydronephrosis of the collecting system. A 0.038 sensor wire was then passed up to the level of the renal pelvis and secured to the drape as a safety wire. The ureteral catheter and cystoscope were removed, leaving the safety wire in place.   A semi-rigid ureteroscope was passed alongside the wire up the distal ureter which appeared normal.  I encountered an 8 mm distal left ureteral stone that was impacted.  Using the 200 m holmium laser fiber, the stone was fragmented completely.  A 0 tip basket was then used to remove all stone fragments.  The semirigid scope was then advanced to the proximal ureter and no further stones were identified.  I placed a separate 0.038 sensor  wire into the ureter and over this wire, I advanced a dual-lumen digital flexible scope.  I surveyed the kidney and identified 1 stone that was  approximately 5 mm in the left lower pole.  This was basketed and moved into the proximal ureter without difficulty.  I then readvanced the semirigid ureteroscope and proceeded to fragment the stone and remove all stone fragments.  I then surveyed the ureter, there was no evidence of any trauma or residual fragments.  Repeat retrograde pyelogram demonstrated no extravasation of contrast, no filling defects and no hydronephrosis.  Once the ureteroscope was removed, the Glidewire was backloaded through the rigid cystoscope, which was then advanced down the urethra and into the bladder. We then used the Glidewire under direct vision through the rigid cystoscope and under fluoroscopic guidance and passed up a 6-French, 28 cm double-pigtail ureteral stent up ureter, making sure that the proximal and distal ends coiled within the kidney and bladder respectively.  I did not leave a tether string.  The cystoscope was then advanced back into the bladder under vision.  We were able to see the distal stent coiling nicely within the bladder.  The intravesical portion of the prostate was friable and was bleeding.  Thus I dilated his urethra with Leander Rams sounds and introduced a resectoscope and fulgurated this prostate with a loop.  Excellent hemostasis was obtained.  I elected to leave the Foley catheter for a few days.  An 22 French Foley catheter was placed.  Prior to this, the bladder was emptied of irrigation and all stone debris was removed.  The patient tolerated the procedure well and there was no complication. Patient was awoken from anesthesia and taken to the recovery room in stable condition. I was present and scrubbed for the entirety of the case.  Plan:  Patient will be discharged home.  He will remove his Foley catheter at home on Tuesday morning with provide a 10 cc syringe.  Follow up with me in 7 to 10 days for stent removal in the office.  [facility]  Pager: [phone]

## 2022-08-08 NOTE — Anesthesia Preprocedure Evaluation (Signed)
Anesthesia Evaluation  Patient identified by MRN, date of birth, ID band Patient awake    Reviewed: Allergy & Precautions, NPO status , Patient's Chart, lab work & pertinent test results  Airway Mallampati: II  TM Distance: >3 FB Neck ROM: Full    Dental no notable dental hx.    Pulmonary neg pulmonary ROS, former smoker   Pulmonary exam normal breath sounds clear to auscultation       Cardiovascular hypertension, Pt. on medications + CAD  Normal cardiovascular exam Rhythm:Regular Rate:Normal     Neuro/Psych negative neurological ROS  negative psych ROS   GI/Hepatic Neg liver ROS,GERD  ,,  Endo/Other  negative endocrine ROS    Renal/GU Renal disease  negative genitourinary   Musculoskeletal negative musculoskeletal ROS (+)    Abdominal   Peds negative pediatric ROS (+)  Hematology negative hematology ROS (+)   Anesthesia Other Findings   Reproductive/Obstetrics negative OB ROS                             Anesthesia Physical Anesthesia Plan  ASA: 3  Anesthesia Plan: General   Post-op Pain Management: Dilaudid IV   Induction: Intravenous  PONV Risk Score and Plan: 2 and Ondansetron, Midazolam and Treatment may vary due to age or medical condition  Airway Management Planned: LMA  Additional Equipment:   Intra-op Plan:   Post-operative Plan: Extubation in OR  Informed Consent: I have reviewed the patients History and Physical, chart, labs and discussed the procedure including the risks, benefits and alternatives for the proposed anesthesia with the patient or authorized representative who has indicated his/her understanding and acceptance.     Dental advisory given  Plan Discussed with: CRNA and Surgeon  Anesthesia Plan Comments:         Anesthesia Quick Evaluation

## 2022-08-08 NOTE — Anesthesia Procedure Notes (Signed)
Procedure Name: LMA Insertion Date/Time: 08/08/2022 9:12 AM  Performed by: Claudia Desanctis, CRNAPre-anesthesia Checklist: Emergency Drugs available, Patient identified, Suction available and Patient being monitored Patient Re-evaluated:Patient Re-evaluated prior to induction Oxygen Delivery Method: Circle system utilized Preoxygenation: Pre-oxygenation with 100% oxygen Induction Type: IV induction Ventilation: Mask ventilation without difficulty LMA: LMA inserted LMA Size: 4.0 and 5.0 Number of attempts: 1 Placement Confirmation: positive ETCO2 and breath sounds checked- equal and bilateral Tube secured with: Tape Dental Injury: Teeth and Oropharynx as per pre-operative assessment

## 2022-08-08 NOTE — Discharge Instructions (Signed)
[facility] [phone] Post Ureteroscopy With or Without Stent Instructions  Definitions:  Ureter: The duct that transports urine from the kidney to the bladder. Stent:   A plastic hollow tube that is placed into the ureter, from the kidney to the bladder to prevent the ureter from swelling shut.  GENERAL INSTRUCTIONS:  Despite the fact that no skin incisions were used, the area around the ureter and bladder is raw and irritated. The stent is a foreign body which will further irritate the bladder wall. This irritation is manifested by increased frequency of urination, both day and night, and by an increase in the urge to urinate. In some, the urge to urinate is present almost always. Sometimes the urge is strong enough that you may not be able to stop yourself from urinating. The only real cure is to remove the stent and then give time for the bladder wall to heal which can't be done until the danger of the ureter swelling shut has passed, which varies.  You may see some blood in your urine while the stent is in place and a few days afterwards. Do not be alarmed, even if the urine was clear for a while. Get off your feet and drink lots of fluids until clearing occurs. If you start to pass clots or don't improve, call us.  DIET: You may return to your normal diet immediately. Because of the raw surface of your bladder, alcohol, spicy foods, acid type foods and drinks with caffeine may cause irritation or frequency and should be used in moderation. To keep your urine flowing freely and to avoid constipation, drink plenty of fluids during the day ( 8-10 glasses ). Tip: Avoid cranberry juice because it is very acidic.  ACTIVITY: Your physical activity doesn't need to be restricted. However, if you are very active, you may see some blood in your urine. We suggest that you reduce your activity under these circumstances until the bleeding has stopped.  BOWELS: It is important to  keep your bowels regular during the postoperative period. Straining with bowel movements can cause bleeding. A bowel movement every other day is reasonable. Use a mild laxative if needed, such as Milk of Magnesia 2-3 tablespoons, or 2 Dulcolax tablets. Call if you continue to have problems. If you have been taking narcotics for pain, before, during or after your surgery, you may be constipated. Take a laxative if necessary.   MEDICATION: You should resume your pre-surgery medications unless told not to. In addition you will often be given an antibiotic to prevent infection. These should be taken as prescribed until the bottles are finished unless you are having an unusual reaction to one of the drugs.  PROBLEMS YOU SHOULD REPORT TO [place]: Fevers over 100.5 Fahrenheit. Heavy bleeding, or clots ( See above notes about blood in urine ). Inability to urinate. Drug reactions ( hives, rash, nausea, vomiting, diarrhea ). Severe burning or pain with urination that is not improving.  FOLLOW-UP: You will need a follow-up appointment to monitor your progress. Call for this appointment at the number listed above. Usually the first appointment will be about three to fourteen days after your surgery.  You have a ureteral stent in place.  This will be removed in the office in follow-up in approximately 7-10 days. He has a Foley catheter in place.  This may be removed on Tuesday morning with provided 10 mL syringe as long as your urine is clear.
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
## 2022-08-12 ENCOUNTER — Other Ambulatory Visit: Payer: Self-pay

## 2022-08-12 ENCOUNTER — Encounter (HOSPITAL_COMMUNITY): Payer: Self-pay | Admitting: Urology

## 2022-08-12 DIAGNOSIS — I10 Essential (primary) hypertension: Secondary | ICD-10-CM

## 2022-08-12 MED ORDER — AMLODIPINE BESYLATE 5 MG PO TABS: 5.0000 mg | ORAL_TABLET | Freq: Every day | ORAL | 3 refills | Status: DC

## 2022-08-12 MED ORDER — CARVEDILOL 6.25 MG PO TABS: 6.2500 mg | ORAL_TABLET | Freq: Two times a day (BID) | ORAL | 1 refills | Status: DC

## 2022-08-12 NOTE — Anesthesia Postprocedure Evaluation (Signed)
Anesthesia Post Note  Patient: Orbie Grupe.  Procedure(s) Performed: CYSTOSCOPYLEFT RETROGRADE PYELOGRAM/LEFT URETEROSCOPY/HOLMIUM LASER/LEFT STENT PLACEMENT; TRANSURETHRAL FULGARATION OF PROSTATE (Left: Ureter)     Patient location during evaluation: PACU Anesthesia Type: General Level of consciousness: awake and alert Pain management: pain level controlled Vital Signs Assessment: post-procedure vital signs reviewed and stable Respiratory status: spontaneous breathing, nonlabored ventilation and respiratory function stable Cardiovascular status: blood pressure returned to baseline and stable Postop Assessment: no apparent nausea or vomiting Anesthetic complications: no   No notable events documented.  Last Vitals:  Vitals:   08/08/22 1130 08/08/22 1145  BP: (!) 137/102 136/84  Pulse: 85 79  Resp: 11 20  Temp: 36.6 C   SpO2: 93% 97%    Last Pain:  Vitals:   08/08/22 1200  TempSrc:   PainSc: 3    Pain Goal: Patients Stated Pain Goal: 3 (08/08/22 1200)                 Lynda Rainwater

## 2022-09-12 ENCOUNTER — Ambulatory Visit: Payer: 59 | Admitting: Cardiology

## 2022-10-09 ENCOUNTER — Ambulatory Visit: Payer: 59 | Admitting: Cardiology

## 2022-10-09 ENCOUNTER — Encounter: Payer: Self-pay | Admitting: Cardiology

## 2022-10-09 VITALS — BP 131/76 | HR 69 | Resp 16 | Ht 72.0 in | Wt 185.0 lb

## 2022-10-09 DIAGNOSIS — I340 Nonrheumatic mitral (valve) insufficiency: Secondary | ICD-10-CM

## 2022-10-09 DIAGNOSIS — I10 Essential (primary) hypertension: Secondary | ICD-10-CM

## 2022-10-09 DIAGNOSIS — E782 Mixed hyperlipidemia: Secondary | ICD-10-CM

## 2022-10-09 DIAGNOSIS — I2510 Atherosclerotic heart disease of native coronary artery without angina pectoris: Secondary | ICD-10-CM

## 2022-10-09 DIAGNOSIS — I351 Nonrheumatic aortic (valve) insufficiency: Secondary | ICD-10-CM

## 2022-10-09 MED ORDER — ISOSORBIDE MONONITRATE ER 30 MG PO TB24: 30.0000 mg | ORAL_TABLET | Freq: Every day | ORAL | 1 refills | Status: DC

## 2022-10-09 MED ORDER — CARVEDILOL 6.25 MG PO TABS: 6.2500 mg | ORAL_TABLET | Freq: Two times a day (BID) | ORAL | 1 refills | Status: DC

## 2022-10-09 MED ORDER — AMLODIPINE BESYLATE 5 MG PO TABS: 5.0000 mg | ORAL_TABLET | Freq: Every day | ORAL | 1 refills | Status: DC
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
## 2022-11-01 ENCOUNTER — Other Ambulatory Visit: Payer: Self-pay | Admitting: Cardiology

## 2023-02-27 ENCOUNTER — Ambulatory Visit: Payer: 59 | Admitting: Cardiology

## 2023-04-17 ENCOUNTER — Other Ambulatory Visit (HOSPITAL_COMMUNITY): Payer: Self-pay | Admitting: Psychiatry

## 2023-04-17 DIAGNOSIS — R251 Tremor, unspecified: Secondary | ICD-10-CM

## 2023-04-27 ENCOUNTER — Other Ambulatory Visit: Payer: Self-pay | Admitting: Cardiology

## 2023-04-27 DIAGNOSIS — I10 Essential (primary) hypertension: Secondary | ICD-10-CM

## 2023-05-03 ENCOUNTER — Other Ambulatory Visit: Payer: Self-pay | Admitting: Cardiology

## 2023-05-03 DIAGNOSIS — I10 Essential (primary) hypertension: Secondary | ICD-10-CM

## 2023-05-05 ENCOUNTER — Encounter (HOSPITAL_COMMUNITY)
Admission: RE | Admit: 2023-05-05 | Discharge: 2023-05-05 | Disposition: A | Discharge status: Home / Self Care | Payer: No Typology Code available for payment source | Source: Ambulatory Visit | Attending: Psychiatry | Admitting: Psychiatry

## 2023-05-05 DIAGNOSIS — R251 Tremor, unspecified: Secondary | ICD-10-CM | POA: Insufficient documentation

## 2023-05-05 MED ORDER — POTASSIUM IODIDE (ANTIDOTE) 130 MG PO TABS
ORAL_TABLET | ORAL | Status: AC
  Filled 2023-05-05: qty 1

## 2023-05-05 MED ORDER — IOFLUPANE I 123 185 MBQ/2.5ML IV SOLN
4.8000 | Freq: Once | INTRAVENOUS | Status: AC | PRN
  Administered 2023-05-05: 4.8 via INTRAVENOUS

## 2023-05-08 ENCOUNTER — Encounter: Payer: No Typology Code available for payment source | Admitting: Nurse Practitioner

## 2023-05-08 NOTE — Progress Notes (Signed)
Error appointment was meant for wife in telehealth

## 2023-07-20 LAB — LAB REPORT - SCANNED
A1c: 5.1
EGFR: 58

## 2023-07-24 ENCOUNTER — Other Ambulatory Visit: Payer: Self-pay

## 2023-07-24 ENCOUNTER — Emergency Department (HOSPITAL_BASED_OUTPATIENT_CLINIC_OR_DEPARTMENT_OTHER)
Admission: EM | Admit: 2023-07-24 | Discharge: 2023-07-25 | Disposition: A | Discharge status: Home / Self Care | Payer: No Typology Code available for payment source | Attending: Emergency Medicine | Admitting: Emergency Medicine

## 2023-07-24 ENCOUNTER — Emergency Department (HOSPITAL_BASED_OUTPATIENT_CLINIC_OR_DEPARTMENT_OTHER): Payer: No Typology Code available for payment source

## 2023-07-24 DIAGNOSIS — Z1152 Encounter for screening for COVID-19: Secondary | ICD-10-CM | POA: Diagnosis not present

## 2023-07-24 DIAGNOSIS — Z79899 Other long term (current) drug therapy: Secondary | ICD-10-CM | POA: Diagnosis not present

## 2023-07-24 DIAGNOSIS — G20C Parkinsonism, unspecified: Secondary | ICD-10-CM | POA: Diagnosis not present

## 2023-07-24 DIAGNOSIS — Z91010 Allergy to peanuts: Secondary | ICD-10-CM | POA: Insufficient documentation

## 2023-07-24 DIAGNOSIS — I2510 Atherosclerotic heart disease of native coronary artery without angina pectoris: Secondary | ICD-10-CM | POA: Insufficient documentation

## 2023-07-24 DIAGNOSIS — R1084 Generalized abdominal pain: Secondary | ICD-10-CM

## 2023-07-24 DIAGNOSIS — Z7982 Long term (current) use of aspirin: Secondary | ICD-10-CM | POA: Insufficient documentation

## 2023-07-24 DIAGNOSIS — I10 Essential (primary) hypertension: Secondary | ICD-10-CM | POA: Diagnosis not present

## 2023-07-24 DIAGNOSIS — K529 Noninfective gastroenteritis and colitis, unspecified: Secondary | ICD-10-CM | POA: Insufficient documentation

## 2023-07-24 DIAGNOSIS — R079 Chest pain, unspecified: Secondary | ICD-10-CM

## 2023-07-24 DIAGNOSIS — R109 Unspecified abdominal pain: Secondary | ICD-10-CM | POA: Diagnosis present

## 2023-07-24 LAB — COMPREHENSIVE METABOLIC PANEL
ALT: 14 U/L (ref 0–44)
AST: 15 U/L (ref 15–41)
Albumin: 4.5 g/dL (ref 3.5–5.0)
Alkaline Phosphatase: 102 U/L (ref 38–126)
Anion gap: 9 (ref 5–15)
BUN: 23 mg/dL (ref 8–23)
CO2: 25 mmol/L (ref 22–32)
Calcium: 9.2 mg/dL (ref 8.9–10.3)
Chloride: 106 mmol/L (ref 98–111)
Creatinine, Ser: 1.33 mg/dL — ABNORMAL HIGH (ref 0.61–1.24)
GFR, Estimated: 55 mL/min — ABNORMAL LOW (ref >60)
Glucose, Bld: 146 mg/dL — ABNORMAL HIGH (ref 70–99)
Potassium: 4.3 mmol/L (ref 3.5–5.1)
Sodium: 140 mmol/L (ref 135–145)
Total Bilirubin: 1.2 mg/dL — ABNORMAL HIGH (ref <1.2)
Total Protein: 6.9 g/dL (ref 6.5–8.1)

## 2023-07-24 LAB — CBC WITH DIFFERENTIAL/PLATELET
Abs Immature Granulocytes: 0.02 10*3/uL (ref 0.00–0.07)
Basophils Absolute: 0 10*3/uL (ref 0.0–0.1)
Basophils Relative: 0 %
Eosinophils Absolute: 0.2 10*3/uL (ref 0.0–0.5)
Eosinophils Relative: 2 %
HCT: 45.8 % (ref 39.0–52.0)
Hemoglobin: 16.3 g/dL (ref 13.0–17.0)
Immature Granulocytes: 0 %
Lymphocytes Relative: 2 %
Lymphs Abs: 0.2 10*3/uL — ABNORMAL LOW (ref 0.7–4.0)
MCH: 33.8 pg (ref 26.0–34.0)
MCHC: 35.6 g/dL (ref 30.0–36.0)
MCV: 95 fL (ref 80.0–100.0)
Monocytes Absolute: 0.3 10*3/uL (ref 0.1–1.0)
Monocytes Relative: 4 %
Neutro Abs: 8.6 10*3/uL — ABNORMAL HIGH (ref 1.7–7.7)
Neutrophils Relative %: 92 %
Platelets: 155 10*3/uL (ref 150–400)
RBC: 4.82 MIL/uL (ref 4.22–5.81)
RDW: 12.1 % (ref 11.5–15.5)
WBC: 9.4 10*3/uL (ref 4.0–10.5)
nRBC: 0 % (ref 0.0–0.2)

## 2023-07-24 LAB — RESP PANEL BY RT-PCR (RSV, FLU A&B, COVID)  RVPGX2
Influenza A by PCR: NEGATIVE
Influenza B by PCR: NEGATIVE
Resp Syncytial Virus by PCR: NEGATIVE
SARS Coronavirus 2 by RT PCR: NEGATIVE

## 2023-07-24 LAB — LACTIC ACID, PLASMA: Lactic Acid, Venous: 1.4 mmol/L (ref 0.5–1.9)

## 2023-07-24 LAB — MAGNESIUM: Magnesium: 1.7 mg/dL (ref 1.7–2.4)

## 2023-07-24 LAB — LIPASE, BLOOD: Lipase: 11 U/L (ref 11–51)

## 2023-07-24 LAB — TROPONIN I (HIGH SENSITIVITY): Troponin I (High Sensitivity): 4 ng/L (ref <18)

## 2023-07-24 MED ORDER — LORAZEPAM 2 MG/ML IJ SOLN
0.5000 mg | Freq: Once | INTRAMUSCULAR | Status: AC
  Administered 2023-07-25: 0.5 mg via INTRAVENOUS
  Filled 2023-07-24: qty 1

## 2023-07-24 MED ORDER — FAMOTIDINE IN NACL 20-0.9 MG/50ML-% IV SOLN
20.0000 mg | Freq: Once | INTRAVENOUS | Status: AC
  Administered 2023-07-24: 20 mg via INTRAVENOUS
  Filled 2023-07-24: qty 50

## 2023-07-24 MED ORDER — IOHEXOL 300 MG/ML  SOLN
100.0000 mL | Freq: Once | INTRAMUSCULAR | Status: AC | PRN
  Administered 2023-07-24: 100 mL via INTRAVENOUS

## 2023-07-24 MED ORDER — SODIUM CHLORIDE 0.9 % IV BOLUS
1000.0000 mL | Freq: Once | INTRAVENOUS | Status: AC
  Administered 2023-07-24: 1000 mL via INTRAVENOUS

## 2023-07-24 MED ORDER — FENTANYL CITRATE PF 50 MCG/ML IJ SOSY
50.0000 ug | PREFILLED_SYRINGE | Freq: Once | INTRAMUSCULAR | Status: AC
Start: 2023-07-24 — End: 2023-07-24
  Administered 2023-07-24: 50 ug via INTRAVENOUS
  Filled 2023-07-24: qty 1

## 2023-07-24 MED ORDER — ONDANSETRON HCL 4 MG/2ML IJ SOLN
4.0000 mg | Freq: Once | INTRAMUSCULAR | Status: AC
  Administered 2023-07-24: 4 mg via INTRAVENOUS
  Filled 2023-07-24: qty 2

## 2023-07-24 NOTE — ED Notes (Signed)
Emesis x5 upon reaching room 7. Uncontrollable diarrhea x2. Soils self and clothing while attempting to reach restroom. Assisted back to bed without incident. Clothing changed and bedside toilet set up.

## 2023-07-24 NOTE — ED Notes (Signed)
Patient transported to CT 

## 2023-07-24 NOTE — ED Notes (Signed)
ED Provider at bedside. 

## 2023-07-24 NOTE — ED Triage Notes (Signed)
Diarrhea started this afternoon, Episodes of weakness. Feels dehydrated

## 2023-07-24 NOTE — ED Provider Notes (Signed)
[place] EMERGENCY DEPARTMENT AT [facility] Provider Note   CSN: [id number] Arrival date & time: 07/24/23  1853     History {Add pertinent medical, surgical, social history, OB history to HPI:1} Chief Complaint  Patient presents with   Diarrhea    Randall Moore. is a 77 y.o. male.   Diarrhea      Home Medications Prior to Admission medications   Medication Sig Start Date End Date Taking? Authorizing Provider  acetaminophen (TYLENOL) 500 MG tablet Take 2 tablets (1,000 mg total) by mouth every 6 (six) hours. Patient taking differently: Take 1,000 mg by mouth every 6 (six) hours as needed for moderate pain. 02/09/22   Diamantina Monks, MD  amLODipine (NORVASC) 5 MG tablet TAKE 1 TABLET (5 MG TOTAL) BY MOUTH DAILY. 04/27/23 10/24/23  Tolia, Sunit, DO  aspirin EC 81 MG tablet Take 81 mg by mouth every morning.     [provider]  atorvastatin (LIPITOR) 80 MG tablet Take 80 mg by mouth every evening. 03/18/16   [provider]  B Complex-C (SUPER B-C PO) Take 1 tablet by mouth daily.    [provider]  carbidopa-levodopa (SINEMET IR) 25-100 MG tablet Take 1 tablet by mouth 4 (four) times daily. 03/29/20   [provider]  Carbidopa-Levodopa ER (SINEMET CR) 25-100 MG tablet controlled release Take 1 tablet by mouth at bedtime.    [provider]  carvedilol (COREG) 6.25 MG tablet TAKE 1 TABLET (6.25 MG TOTAL) BY MOUTH 2 (TWO) TIMES DAILY. HOLD IF SYSTOLIC BLOOD PRESSURE (TOP NUMBER) LESS THAN 100 MMHG OR PULSE LESS THAN 60 BPM. 05/05/23 11/01/23  Tolia, Sunit, DO  desloratadine (CLARINEX) 5 MG tablet Take 5 mg by mouth 2 (two) times a day.     [provider]  EPINEPHrine 0.3 mg/0.3 mL IJ SOAJ injection Inject 0.3 mg into the muscle as needed for anaphylaxis.     [provider]  ferrous sulfate 325 (65 FE) MG tablet Take 325 mg by mouth every other day.    [provider]  isosorbide mononitrate  (IMDUR) 30 MG 24 hr tablet Take 1 tablet (30 mg total) by mouth daily. 10/09/22 04/07/23  Tolia, Sunit, DO  nitroGLYCERIN (NITROSTAT) 0.4 MG SL tablet Place 0.4 mg under the tongue every 5 (five) minutes as needed for chest pain.  03/18/16   [provider]  Omega-3 Fatty Acids (OMEGA-3 PO) Take 1 capsule by mouth daily.    [provider]  omeprazole (PRILOSEC) 40 MG capsule Take 40 mg by mouth daily.    [provider]  polyethylene glycol (MIRALAX) 17 g packet Take 17 g by mouth daily.    [provider]  Saw Palmetto 160 MG CAPS Take 1 tablet by mouth daily. 03/18/16   [provider]  tamsulosin (FLOMAX) 0.4 MG CAPS capsule Take 0.4 mg by mouth at bedtime. 08/27/22   [provider]      Allergies    Ibuprofen, Other, Peanut-containing drug products, Penicillins, and Tomato    Review of Systems   Review of Systems  Gastrointestinal:  Positive for diarrhea.    Physical Exam Updated Vital Signs BP (!) 261/84 (BP Location: Right Arm)   Pulse 95   Temp 97.6 F (36.4 C)   Resp 20   SpO2 96%  Physical Exam  ED Results / Procedures / Treatments   Labs (all labs ordered are listed, but only abnormal results are displayed) Labs Reviewed - No  data to display  EKG None  Radiology No results found.  Procedures Procedures  {Document cardiac monitor, telemetry assessment procedure when appropriate:1}  Medications Ordered in ED Medications  ondansetron (ZOFRAN) injection 4 mg (4 mg Intravenous Given 07/24/23 1941)    ED Course/ Medical Decision Making/ A&P   {   Click here for ABCD2, HEART and other calculatorsREFRESH Note before signing :1}                              Medical Decision Making Risk Prescription drug management.   ***  {Document critical care time when appropriate:1} {Document review of labs and clinical decision tools ie heart score, Chads2Vasc2 etc:1}  {Document your independent review of radiology  images, and any outside records:1} {Document your discussion with family members, caretakers, and with consultants:1} {Document social determinants of health affecting pt's care:1} {Document your decision making why or why not admission, treatments were needed:1} Final Clinical Impression(s) / ED Diagnoses Final diagnoses:  None    Rx / DC Orders ED Discharge Orders     None

## 2023-07-25 ENCOUNTER — Emergency Department (HOSPITAL_BASED_OUTPATIENT_CLINIC_OR_DEPARTMENT_OTHER): Payer: No Typology Code available for payment source

## 2023-07-25 LAB — TROPONIN I (HIGH SENSITIVITY): Troponin I (High Sensitivity): 4 ng/L (ref <18)

## 2023-07-25 MED ORDER — IOHEXOL 350 MG/ML SOLN
100.0000 mL | Freq: Once | INTRAVENOUS | Status: AC | PRN
  Administered 2023-07-25: 100 mL via INTRAVENOUS

## 2023-07-25 MED ORDER — ONDANSETRON 4 MG PO TBDP: 4.0000 mg | ORAL_TABLET | Freq: Three times a day (TID) | ORAL | 0 refills | Status: DC | PRN

## 2023-07-25 NOTE — Discharge Instructions (Addendum)
We suspect you have a viral illness causing your vomiting and diarrhea.  Start with a clear liquid diet and advance slowly as tolerated.  Your CT scan does not show any acute surgical problem.  No evidence of heart attack but you do have known heart disease with minimal blockages.  You should follow-up with your cardiologist for further evaluation.  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  Follow-up with the cardiothoracic surgeon Dr. Laneta Simmers for further evaluation of this.  It is important to maintain good blood pressure control below 140 systolic.  Return to the ED with exertional chest pain, pain associated with shortness of breath, nausea, vomiting, sweating, intractable nausea vomiting or other concerns.

## 2023-07-25 NOTE — ED Notes (Signed)
Patient transported to CT 

## 2023-07-25 NOTE — ED Notes (Signed)
 RN reviewed discharge instructions with pt. Pt verbalized understanding and had no further questions. VSS upon discharge.  

## 2023-07-25 NOTE — ED Notes (Signed)
Pt given 8 oz of ginger rale PO. Tolerated well.

## 2023-07-25 NOTE — ED Provider Notes (Signed)
Carafate from Dr. Karene Fry.  Patient here with nausea, vomiting, diarrhea with abdominal cramps.  Since developed chest pain while in the ED that comes and goes lasting for a few seconds at a time.  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 It was felt that his symptoms were likely secondary to coronary vasospasm/microvascular angina.   Awaiting repeat troponin.  He is able to tolerate p.o. and not have any further vomiting or diarrhea.  CT abdomen pelvis positive for enteritis.  Repeat troponin is unchanged.  Troponin Negative x 2.  Patient still complaining of intermittent pain in his chest lasting for just a second or 2 at a time that seems to coincide with his PVCs on the monitor.  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  No evidence of aneurysm or dissection. . Discussed with Dr. Laneta Simmers of thoracic surgery.  He feels this is unlikely to be the source of patient's chest pain.  He states this ulcer is relatively small and can be followed in the outpatient setting.  No acute intervention necessary.  No evidence of pseudoaneurysm or impending rupture.  Just recommends blood pressure control below 140 systolic.  Patient can be seen in the office next week to discuss possible stenting. Patient contact information sent to Dr. Laneta Simmers  Patient feels improved on recheck.  He is tolerating p.o.  All results discussed with him and family at bedside.  Discussed clear liquid diet advance slowly over the next several days. Admission offered to the hospital but he prefers to go home. Has follow-up with both his cardiologist as well as thoracic surgery.  He states he no longer sees Dr. Odis Hollingshead and goes to the [place].  Return to the ED with new or worsening symptoms.   Glynn Octave, MD 07/25/23 [phone]

## 2023-08-11 ENCOUNTER — Encounter: Payer: Self-pay | Admitting: Surgery

## 2023-08-11 ENCOUNTER — Other Ambulatory Visit: Payer: Self-pay | Admitting: Surgery

## 2023-08-11 ENCOUNTER — Institutional Professional Consult (permissible substitution): Payer: 59 | Admitting: Surgery

## 2023-08-11 VITALS — BP 144/83 | HR 65 | Resp 18 | Ht 72.0 in | Wt 180.0 lb

## 2023-08-11 DIAGNOSIS — I700 Atherosclerosis of aorta: Secondary | ICD-10-CM | POA: Diagnosis not present

## 2023-08-11 DIAGNOSIS — I719 Aortic aneurysm of unspecified site, without rupture: Secondary | ICD-10-CM | POA: Diagnosis not present
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
## 2023-08-24 ENCOUNTER — Ambulatory Visit (INDEPENDENT_AMBULATORY_CARE_PROVIDER_SITE_OTHER): Payer: No Typology Code available for payment source | Admitting: Surgery

## 2023-08-24 ENCOUNTER — Encounter: Payer: Self-pay | Admitting: Surgery

## 2023-08-24 VITALS — BP 109/72 | HR 76 | Temp 98.2°F | Resp 20 | Ht 72.0 in | Wt 180.6 lb

## 2023-08-24 DIAGNOSIS — I7123 Aneurysm of the descending thoracic aorta, without rupture: Secondary | ICD-10-CM | POA: Diagnosis not present
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
## 2023-08-26 ENCOUNTER — Other Ambulatory Visit: Payer: Self-pay

## 2023-08-26 DIAGNOSIS — I7123 Aneurysm of the descending thoracic aorta, without rupture: Secondary | ICD-10-CM

## 2023-08-28 ENCOUNTER — Ambulatory Visit: Payer: No Typology Code available for payment source | Admitting: Cardiology

## 2023-09-02 ENCOUNTER — Ambulatory Visit (HOSPITAL_COMMUNITY): Payer: No Typology Code available for payment source | Attending: Internal Medicine

## 2023-09-02 DIAGNOSIS — I700 Atherosclerosis of aorta: Secondary | ICD-10-CM | POA: Diagnosis present

## 2023-09-02 DIAGNOSIS — I719 Aortic aneurysm of unspecified site, without rupture: Secondary | ICD-10-CM | POA: Insufficient documentation

## 2023-09-02 LAB — ECHOCARDIOGRAM COMPLETE
Area-P 1/2: 2.8 cm2
MV M vel: 4.96 m/s
MV Peak grad: 98.4 mm[Hg]
S' Lateral: 2.6 cm

## 2023-09-02 NOTE — Progress Notes (Signed)
Surgical Instructions   Your procedure is scheduled on Wednesday, January 29th, 2025. Report to [facility] Main Entrance "A" at 5:30 A.M., then check in with the Admitting office. Any questions or running late day of surgery: call [phone]  Questions prior to your surgery date: call [phone], Monday-Friday, 8am-4pm. If you experience any cold or flu symptoms such as cough, fever, chills, shortness of breath, etc. between now and your scheduled surgery, please notify us at the above number.     Remember:  Do not eat or drink after midnight the night before your surgery    Take these medicines the morning of surgery with A SIP OF WATER: Carbidopa-levodopa (Sinemet) Carvedilol (Coreg) Isosorbide Mononitrate (Imdur)   May take these medicines IF NEEDED: Cromolyn (Nasalcrom)  Epinephrine Ipratropium (Atrovent) Nitroglycerin (Nitrostat) Eye drops   Per Dr. Estanislado Spire office, continue taking Aspirin.    One week prior to surgery, STOP taking Aleve, Naproxen, Ibuprofen, Motrin, Advil, Goody's, BC's, all herbal medications, fish oil, and non-prescription vitamins.                     Do NOT Smoke (Tobacco/Vaping) for 24 hours prior to your procedure.  If you use a CPAP at night, you may bring your mask/headgear for your overnight stay.   You will be asked to remove any contacts, glasses, piercing's, hearing aid's, dentures/partials prior to surgery. Please bring cases for these items if needed.    Patients discharged the day of surgery will not be allowed to drive home, and someone needs to stay with them for 24 hours.  SURGICAL WAITING ROOM VISITATION Patients may have no more than 2 support people in the waiting area - these visitors may rotate.   Pre-op nurse will coordinate an appropriate time for 1 ADULT support person, who may not rotate, to accompany patient in pre-op.  Children under the age of 94 must have an adult with them who is not the patient and must remain  in the main waiting area with an adult.  If the patient needs to stay at the hospital during part of their recovery, the visitor guidelines for inpatient rooms apply.  Please refer to the [facility] website for the visitor guidelines for any additional information.   If you received a COVID test during your pre-op visit  it is requested that you wear a mask when out in public, stay away from anyone that may not be feeling well and notify your surgeon if you develop symptoms. If you have been in contact with anyone that has tested positive in the last 10 days please notify you surgeon.      Pre-operative CHG Bathing Instructions   You can play a key role in reducing the risk of infection after surgery. Your skin needs to be as free of germs as possible. You can reduce the number of germs on your skin by washing with CHG (chlorhexidine gluconate) soap before surgery. CHG is an antiseptic soap that kills germs and continues to kill germs even after washing.   DO NOT use if you have an allergy to chlorhexidine/CHG or antibacterial soaps. If your skin becomes reddened or irritated, stop using the CHG and notify one of our RNs at [phone].              TAKE A SHOWER THE NIGHT BEFORE SURGERY AND THE DAY OF SURGERY    Please keep in mind the following:  DO NOT shave, including legs and underarms, 48 hours  prior to surgery.   You may shave your face before/day of surgery.  Place clean sheets on your bed the night before surgery Use a clean washcloth (not used since being washed) for each shower. DO NOT sleep with pet's night before surgery.  CHG Shower Instructions:  Wash your face and private area with normal soap. If you choose to wash your hair, wash first with your normal shampoo.  After you use shampoo/soap, rinse your hair and body thoroughly to remove shampoo/soap residue.  Turn the water OFF and apply half the bottle of CHG soap to a CLEAN washcloth.  Apply CHG soap ONLY FROM YOUR  NECK DOWN TO YOUR TOES (washing for 3-5 minutes)  DO NOT use CHG soap on face, private areas, open wounds, or sores.  Pay special attention to the area where your surgery is being performed.  If you are having back surgery, having someone wash your back for you may be helpful. Wait 2 minutes after CHG soap is applied, then you may rinse off the CHG soap.  Pat dry with a clean towel  Put on clean pajamas    Additional instructions for the day of surgery: DO NOT APPLY any lotions, deodorants, cologne, or perfumes.   Do not wear jewelry or makeup Do not wear nail polish, gel polish, artificial nails, or any other type of covering on natural nails (fingers and toes) Do not bring valuables to the hospital. [facility] is not responsible for valuables/personal belongings. Put on clean/comfortable clothes.  Please brush your teeth.  Ask your nurse before applying any prescription medications to the skin.

## 2023-09-03 ENCOUNTER — Other Ambulatory Visit: Payer: Self-pay

## 2023-09-03 ENCOUNTER — Encounter (HOSPITAL_COMMUNITY): Payer: Self-pay

## 2023-09-03 ENCOUNTER — Encounter (HOSPITAL_COMMUNITY)
Admission: RE | Admit: 2023-09-03 | Discharge: 2023-09-03 | Disposition: A | Discharge status: Home / Self Care | Payer: No Typology Code available for payment source | Source: Ambulatory Visit | Attending: Surgery | Admitting: Surgery

## 2023-09-03 VITALS — BP 112/73 | HR 72 | Temp 98.2°F | Resp 18 | Ht 72.0 in | Wt 181.7 lb

## 2023-09-03 DIAGNOSIS — I7123 Aneurysm of the descending thoracic aorta, without rupture: Secondary | ICD-10-CM | POA: Diagnosis not present

## 2023-09-03 DIAGNOSIS — Z01818 Encounter for other preprocedural examination: Secondary | ICD-10-CM

## 2023-09-03 DIAGNOSIS — Z01812 Encounter for preprocedural laboratory examination: Secondary | ICD-10-CM | POA: Diagnosis present

## 2023-09-03 LAB — PROTIME-INR
INR: 1 (ref 0.8–1.2)
Prothrombin Time: 13.7 s (ref 11.4–15.2)

## 2023-09-03 LAB — COMPREHENSIVE METABOLIC PANEL
ALT: 7 U/L (ref 0–44)
AST: 19 U/L (ref 15–41)
Albumin: 4 g/dL (ref 3.5–5.0)
Alkaline Phosphatase: 103 U/L (ref 38–126)
Anion gap: 8 (ref 5–15)
BUN: 12 mg/dL (ref 8–23)
CO2: 28 mmol/L (ref 22–32)
Calcium: 8.9 mg/dL (ref 8.9–10.3)
Chloride: 104 mmol/L (ref 98–111)
Creatinine, Ser: 1.18 mg/dL (ref 0.61–1.24)
GFR, Estimated: >60 mL/min (ref >60)
Glucose, Bld: 128 mg/dL — ABNORMAL HIGH (ref 70–99)
Potassium: 3.9 mmol/L (ref 3.5–5.1)
Sodium: 140 mmol/L (ref 135–145)
Total Bilirubin: 1 mg/dL (ref 0.0–1.2)
Total Protein: 6.5 g/dL (ref 6.5–8.1)

## 2023-09-03 LAB — TYPE AND SCREEN
ABO/RH(D): O NEG
Antibody Screen: NEGATIVE

## 2023-09-03 LAB — URINALYSIS, ROUTINE W REFLEX MICROSCOPIC
Bacteria, UA: NONE SEEN
Bilirubin Urine: NEGATIVE
Glucose, UA: NEGATIVE mg/dL
Hgb urine dipstick: NEGATIVE
Ketones, ur: 5 mg/dL — AB
Leukocytes,Ua: NEGATIVE
Nitrite: NEGATIVE
Protein, ur: NEGATIVE mg/dL
Specific Gravity, Urine: 1.017 (ref 1.005–1.030)
pH: 5 (ref 5.0–8.0)

## 2023-09-03 LAB — CBC
HCT: 45.7 % (ref 39.0–52.0)
Hemoglobin: 16.2 g/dL (ref 13.0–17.0)
MCH: 33.2 pg (ref 26.0–34.0)
MCHC: 35.4 g/dL (ref 30.0–36.0)
MCV: 93.6 fL (ref 80.0–100.0)
Platelets: 166 10*3/uL (ref 150–400)
RBC: 4.88 MIL/uL (ref 4.22–5.81)
RDW: 11.9 % (ref 11.5–15.5)
WBC: 5.9 10*3/uL (ref 4.0–10.5)
nRBC: 0 % (ref 0.0–0.2)

## 2023-09-03 LAB — SURGICAL PCR SCREEN
MRSA, PCR: NEGATIVE
Staphylococcus aureus: NEGATIVE

## 2023-09-03 LAB — APTT: aPTT: 31 s (ref 24–36)

## 2023-09-03 NOTE — Progress Notes (Signed)
[facility].  If emergent he will use PCP Dr. Adelene Amas locally Cardiologist - [facility]  PPM/ICD - Denies Device Orders - n/a Rep Notified - n/a  Chest x-ray - N/A EKG - 07/24/23 Stress Test - 1990's per patient ECHO - 09/02/23 Cardiac Cath - 2022  Sleep Study - Denies CPAP - n/a  Non-diabetic  Last dose of GLP1 agonist-  Denies GLP1 instructions: n/a  Blood Thinner Instructions: Denies Aspirin Instructions: Per Dr. Myra Gianotti keep taking.  Patient takes at night so his last dose will be on 09/08/23 in the evening.  ERAS Protcol - NPO PRE-SURGERY Ensure or G2- none  COVID TEST- N   Anesthesia review: Y, HTN, CAD, kidney disease  Patient denies shortness of breath, fever, cough and chest pain at PAT appointment. Patient denies any respiratory issues at this time.    All instructions explained to the patient, with a verbal understanding of the material. Patient agrees to go over the instructions while at home for a better understanding. Patient also instructed to self quarantine after being tested for COVID-19. The opportunity to ask questions was provided.

## 2023-09-04 NOTE — Progress Notes (Signed)
Anesthesia Chart Review:  78 year old male with pertinent history including, HTN, HLD, moderate nonobstructive coronary disease by catheterization in June 2022, mild to moderate aortic insufficiency and mild mitral regurgitation by echocardiogram in January 2023, and Parkinson's disease with tremor.  Recent CTA of the chest during ED visit on 07/25/2023 showed a penetrating atherosclerotic ulcer on the undersurface of the mid aortic arch.  He was seen by Dr. Myra Gianotti on 08/24/2023 and endovascular repair was recommended.  Preop labs reviewed, WNL.  EKG 07/25/2023: Sinus rhythm.  Rate 95.  Multiple PVCs.  Inferior infarct, old.  TTE 08/21/2021: Normal LV systolic function with visual EF 60-65%. Left ventricle cavity is normal in size. Mild left ventricular hypertrophy. Normal global wall motion. Normal diastolic filling pattern, normal LAP.  Mild to moderate aortic regurgitation. Mild (Grade I) mitral regurgitation. Trace tricuspid regurgitation. No evidence of pulmonary hypertension. Compared to study 01/29/2021: G1DD is now normal, moderate AR is now mild/moderate, moderate MR is now mild, mild/moderate TR is now trace.   Cath 02/05/2021: LV 110/1, EDP 9 mmHg.  Ao 112/59, mean 81 mmHg.  There was no pressure clinical statical. LM: Smooth and normal. LAD: Proximal LAD smooth 40% stenosis.  There is minimal disease in the midsegment.  Moderate-sized D1 and D2. Ramus: Large vessel, has secondary branches.  Minimal disease is evident. RCA: Dominant.  Very mild disease in the PL branch.   Recommendation: Patient had very similar anatomy in 2017 angiography.  No change in the coronary anatomy, previously he has had FFR/PressureWire evaluation of the proximal LAD stenosis.  Hence I do not think he needs further evaluation of this lesion by physiologic means.  Suspect the lesion is only moderate.  Suspect microvascular angina or coronary spasm to be the etiology, we will try isosorbide mononitrate 60 mg  daily.  30 mill contrast utilized.    Zannie Cove [facility] Short Stay Center/Anesthesiology Phone [phone] 09/04/2023 12:37 PM

## 2023-09-04 NOTE — Anesthesia Preprocedure Evaluation (Signed)
Anesthesia Evaluation  Patient identified by MRN, date of birth, ID band Patient awake    Reviewed: Allergy & Precautions, NPO status , Patient's Chart, lab work & pertinent test results, reviewed documented beta blocker date and time   History of Anesthesia Complications Negative for: history of anesthetic complications  Airway Mallampati: II  TM Distance: >3 FB Neck ROM: Full    Dental  (+) Dental Advisory Given   Pulmonary former smoker   breath sounds clear to auscultation       Cardiovascular hypertension, Pt. on medications and Pt. on home beta blockers (-) angina + CAD (non-obstructive by cath '22) and + Peripheral Vascular Disease   Rhythm:Regular Rate:Normal  09/02/2023 ECHO: EF 55-60%, normal LVF, Grade 1 DD, normal RVF, mild MR, mild dilation of asc aorta 41 mm   Neuro/Psych Parkinson's    GI/Hepatic Neg liver ROS,GERD  Controlled,,  Endo/Other  negative endocrine ROS    Renal/GU Renal InsufficiencyRenal disease     Musculoskeletal   Abdominal   Peds  Hematology   Anesthesia Other Findings   Reproductive/Obstetrics                             Anesthesia Physical Anesthesia Plan  ASA: 3  Anesthesia Plan: General   Post-op Pain Management: Tylenol PO (pre-op)*   Induction: Intravenous  PONV Risk Score and Plan: 2 and Ondansetron and Dexamethasone  Airway Management Planned: Oral ETT  Additional Equipment: None  Intra-op Plan:   Post-operative Plan: Extubation in OR  Informed Consent: I have reviewed the patients History and Physical, chart, labs and discussed the procedure including the risks, benefits and alternatives for the proposed anesthesia with the patient or authorized representative who has indicated his/her understanding and acceptance.     Dental advisory given  Plan Discussed with: CRNA and Surgeon  Anesthesia Plan Comments: (Pt's family's concerns  about fentanyl causing PVCs addressed.  PAT note by Antionette Poles, PA-C: 78 year old male with pertinent history including, HTN, HLD, moderate nonobstructive coronary disease by catheterization in June 2022, mild to moderate aortic insufficiency and mild mitral regurgitation by echocardiogram in January 2023, and Parkinson's disease with tremor.  Recent CTA of the chest during ED visit on 07/25/2023 showed a penetrating atherosclerotic ulcer on the undersurface of the mid aortic arch.  He was seen by Dr. Myra Gianotti on 08/24/2023 and endovascular repair was recommended.  Preop labs reviewed, WNL.  EKG 07/25/2023: Sinus rhythm.  Rate 95.  Multiple PVCs.  Inferior infarct, old.  TTE 08/21/2021: Normal LV systolic function with visual EF 60-65%. Left ventricle cavity is normal in size. Mild left ventricular hypertrophy. Normal global wall motion. Normal diastolic filling pattern, normal LAP.  Mild to moderate aortic regurgitation. Mild (Grade I) mitral regurgitation. Trace tricuspid regurgitation. No evidence of pulmonary hypertension. Compared to study 01/29/2021: G1DD is now normal, moderate AR is now mild/moderate, moderate MR is now mild, mild/moderate TR is now trace.   Cath 02/05/2021: LV110/1, EDP 9 mmHg. Ao 112/59, mean 81 mmHg. There was no pressure clinical statical. ZO:XWRUEA and normal. VWU:[id number] LAD smooth 40% stenosis. There is minimal disease in the midsegment. Moderate-sized D1 and D2. Ramus:Large vessel, has secondary branches. Minimal disease is evident. RCA: Dominant. Very mild disease in the PL branch.  Recommendation:Patient had very similar anatomy in 2017 angiography. No change in the coronary anatomy, previously he has had FFR/PressureWire evaluation of the proximal LAD stenosis. Hence I do not think he needs further  evaluation of this lesion by physiologic means. Suspect the lesion is only moderate. Suspect microvascular angina or coronary spasm to be the  etiology, we will try isosorbide mononitrate 60 mg daily. 30 mill contrast utilized.  )        Anesthesia Quick Evaluation

## 2023-09-07 ENCOUNTER — Ambulatory Visit: Payer: No Typology Code available for payment source | Attending: Cardiology | Admitting: Cardiology

## 2023-09-07 ENCOUNTER — Encounter: Payer: Self-pay | Admitting: Cardiology

## 2023-09-07 VITALS — BP 110/70 | HR 74 | Ht 72.0 in | Wt 180.4 lb

## 2023-09-07 DIAGNOSIS — G20A1 Parkinson's disease without dyskinesia, without mention of fluctuations: Secondary | ICD-10-CM

## 2023-09-07 DIAGNOSIS — I2510 Atherosclerotic heart disease of native coronary artery without angina pectoris: Secondary | ICD-10-CM

## 2023-09-07 DIAGNOSIS — I719 Aortic aneurysm of unspecified site, without rupture: Secondary | ICD-10-CM

## 2023-09-07 DIAGNOSIS — E782 Mixed hyperlipidemia: Secondary | ICD-10-CM | POA: Diagnosis not present

## 2023-09-07 DIAGNOSIS — Z87891 Personal history of nicotine dependence: Secondary | ICD-10-CM

## 2023-09-07 DIAGNOSIS — I351 Nonrheumatic aortic (valve) insufficiency: Secondary | ICD-10-CM

## 2023-09-07 DIAGNOSIS — I10 Essential (primary) hypertension: Secondary | ICD-10-CM

## 2023-09-07 DIAGNOSIS — I340 Nonrheumatic mitral (valve) insufficiency: Secondary | ICD-10-CM

## 2023-09-07 MED ORDER — ISOSORBIDE MONONITRATE ER 30 MG PO TB24: 30.0000 mg | ORAL_TABLET | Freq: Every day | ORAL | 3 refills | Status: DC

## 2023-09-07 MED ORDER — CARVEDILOL 6.25 MG PO TABS: 6.2500 mg | ORAL_TABLET | Freq: Two times a day (BID) | ORAL | 3 refills | Status: DC

## 2023-09-07 MED ORDER — AMLODIPINE BESYLATE 5 MG PO TABS: 5.0000 mg | ORAL_TABLET | Freq: Every evening | ORAL | 3 refills | Status: DC

## 2023-09-07 NOTE — Progress Notes (Signed)
Cardiology Office Note:  .   Date:  09/07/2023  ID:  Randall Moore., DOB 1945-09-09, MRN [id number] PCP:  Barbie Banner, MD  Former Cardiology Providers: Dr. Eden Emms and Dr. E[facility] Providers Cardiologist:  Tessa Lerner, DO , [facility] (established care 01/11/2021) Electrophysiologist:  None  Click to update primary MD,subspecialty MD or APP then REFRESH:1}    Chief Complaint  Patient presents with   Follow-up    Annual follow up     History of Present Illness: .   Randall Moore. is a 78 y.o. Caucasian male whose past medical history and cardiovascular risk factors includes: Penetrating aortic ulcer, nonobstructive CAD, Parkinson's disease, hypertension, hyperlipidemia, hypertriglyceridemia, former smoker, advanced age.   Initially referred to the practice for chest pain evaluation. 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 It was felt that his symptoms were likely secondary to coronary vasospasm/microvascular angina. He was doing well clinically from a cardiovascular standpoint until he had a mechanical fall while cutting trees and had to be hospitalized due to orbital fractures, T2/T3 fractures, abrasions, and scalp lacerations.   Patient presents today for 1 year follow-up visit.  In December 2024 patient went to the emergency room department for abdominal pain and underwent imaging studies which is consistent with enteritis.  He also voiced concerns for precordial pain underwent CT of the chest which noted penetrating aortic ulcer on the underside of the mid aortic arch with surrounding intramural hemorrhage/mural thickening.  Patient followed up with cardiothoracic surgeon Dr. Laneta Simmers as well as vascular surgeon and plans to undergo thoracic aortic endovascular stent graft with thoracic branch endoprosthesis later this week.  Clinically denies anginal chest pain or heart failure symptoms.  Overall function  capacity remains relatively stable.    Review of Systems: .   Review of Systems  Cardiovascular:  Negative for chest pain, claudication, dyspnea on exertion, irregular heartbeat, leg swelling, near-syncope, orthopnea, palpitations, paroxysmal nocturnal dyspnea and syncope.  Respiratory:  Negative for shortness of breath.   Hematologic/Lymphatic: Negative for bleeding problem.  Musculoskeletal:  Positive for falls and joint pain. Negative for muscle cramps and myalgias.  Neurological:  Positive for tremors (Resting) and vertigo. Negative for dizziness and light-headedness.    Studies Reviewed:    Echocardiogram: 09/02/2023 LVEF 55 to 60%, grade 1 diastolic dysfunction. Right ventricular size is mildly dilated, function normal. Mild MR Trivial AR Ascending aorta 41 mm, dilated. Estimated RAP 8 mmHg.  Left heart catheterization 02/05/2021: LV 110/1, EDP 9 mmHg.  Ao 112/59, mean 81 mmHg.  There was no pressure clinical statical. LM: Smooth and normal. LAD: Proximal LAD smooth 40% stenosis.  There is minimal disease in the midsegment.  Moderate-sized D1 and D2. Ramus: Large vessel, has secondary branches.  Minimal disease is evident. RCA: Dominant.  Very mild disease in the PL branch.   Recommendation: Patient had very similar anatomy in 2017 angiography.  No change in the coronary anatomy, previously he has had FFR/PressureWire evaluation of the proximal LAD stenosis.  Hence I do not think he needs further evaluation of this lesion by physiologic means.  Suspect the lesion is only moderate.  Suspect microvascular angina or coronary spasm to be the etiology, we will try isosorbide mononitrate 60 mg daily.  30 mill contrast utilized.   RADIOLOGY: CT Angio Chest Aorta W and/or Wo Contrast 07/25/2023 1. Penetrating atherosclerotic ulcer with small amount of surrounding intramural hemorrhage or mural thickening demonstrating slight  interval increase in size since prior examination of  02/07/2022. No mediastinal hematoma. No aortic dissection or aneurysm. 2. Fluid-filled esophagus suggesting changes of gastroesophageal reflux or esophageal dysmotility. 3. Stable compression deformities of T2 and T3.   Aortic Atherosclerosis (ICD10-I70.0).  Risk Assessment/Calculations:   NA   Labs:       Latest Ref Rng & Units 09/03/2023    9:22 AM 07/24/2023    7:45 PM 08/06/2022   11:00 AM  CBC  WBC 4.0 - 10.5 K/uL 5.9  9.4  4.4   Hemoglobin 13.0 - 17.0 g/dL 16.1  09.6  04.5   Hematocrit 39.0 - 52.0 % 45.7  45.8  43.0   Platelets 150 - 400 K/uL 166  155  162        Latest Ref Rng & Units 09/03/2023    9:22 AM 07/24/2023    7:45 PM 08/06/2022   11:00 AM  BMP  Glucose 70 - 99 mg/dL 409  811  914   BUN 8 - 23 mg/dL 12  23  20    Creatinine 0.61 - 1.24 mg/dL 7.82  9.56  2.13   Sodium 135 - 145 mmol/L 140  140  141   Potassium 3.5 - 5.1 mmol/L 3.9  4.3  4.0   Chloride 98 - 111 mmol/L 104  106  111   CO2 22 - 32 mmol/L 28  25  25    Calcium 8.9 - 10.3 mg/dL 8.9  9.2  8.9       Latest Ref Rng & Units 09/03/2023    9:22 AM 07/24/2023    7:45 PM 08/06/2022   11:00 AM  CMP  Glucose 70 - 99 mg/dL 086  578  469   BUN 8 - 23 mg/dL 12  23  20    Creatinine 0.61 - 1.24 mg/dL 6.29  5.28  4.13   Sodium 135 - 145 mmol/L 140  140  141   Potassium 3.5 - 5.1 mmol/L 3.9  4.3  4.0   Chloride 98 - 111 mmol/L 104  106  111   CO2 22 - 32 mmol/L 28  25  25    Calcium 8.9 - 10.3 mg/dL 8.9  9.2  8.9   Total Protein 6.5 - 8.1 g/dL 6.5  6.9    Total Bilirubin 0.0 - 1.2 mg/dL 1.0  1.2    Alkaline Phos 38 - 126 U/L 103  102    AST 15 - 41 U/L 19  15    ALT 0 - 44 U/L 7  14      No results found for: "CHOL", "HDL", "LDLCALC", "LDLDIRECT", "TRIG", "CHOLHDL" No results for input(s): "LIPOA" in the last 8760 hours. No components found for: "NTPROBNP" No results for input(s): "PROBNP" in the last 8760 hours. No results for input(s): "TSH" in the last 8760 hours.  Physical Exam:    Today's  Vitals   09/07/23 1050  BP: 110/70  Pulse: 74  SpO2: 96%  Weight: 180 lb 6.4 oz (81.8 kg)  Height: 6' (1.829 m)   Body mass index is 24.47 kg/m. Wt Readings from Last 3 Encounters:  09/07/23 180 lb 6.4 oz (81.8 kg)  09/03/23 181 lb 11.2 oz (82.4 kg)  08/24/23 180 lb 9.6 oz (81.9 kg)    Physical Exam  Constitutional: No distress.  Age appropriate, hemodynamically stable.   Neck: No JVD present.  Cardiovascular: Normal rate, regular rhythm, S1 normal, S2 normal, intact distal pulses and normal pulses. Exam reveals no gallop, no  S3 and no S4.  No murmur heard. Pulmonary/Chest: Effort normal and breath sounds normal. No stridor. He has no wheezes. He has no rales.  Abdominal: Soft. Bowel sounds are normal. He exhibits no distension. There is no abdominal tenderness.  Musculoskeletal:        General: No edema.     Cervical back: Neck supple.  Neurological: He is alert and oriented to person, place, and time. He has intact cranial nerves (2-12).  Skin: Skin is warm and moist.    Impression & Recommendation(s):  Impression:   ICD-10-CM   1. Benign hypertension  I10 amLODipine (NORVASC) 5 MG tablet    carvedilol (COREG) 6.25 MG tablet    isosorbide mononitrate (IMDUR) 30 MG 24 hr tablet    2. Nonobstructive atherosclerosis of coronary artery  I25.10     3. Penetrating atherosclerotic ulcer of aorta (HCC)  I71.9     4. Mixed hyperlipidemia  E78.2     5. Nonrheumatic aortic valve insufficiency  I35.1     6. Nonrheumatic mitral valve regurgitation  I34.0     7. Parkinson's disease, unspecified whether dyskinesia present, unspecified whether manifestations fluctuate (HCC)  G20.A1     8. Former smoker  Z87.891        Recommendation(s):  Benign hypertension Office and home blood pressures are well-controlled. Patient requesting refills. Refill amlodipine 5 mg p.o. daily. Refill carvedilol 6.25 mg p.o. twice daily. Refill Imdur 30 mg p.o. daily Reemphasized importance of  low-salt diet.  Nonobstructive atherosclerosis of coronary artery Denies anginal chest pain or heart failure symptoms. Last heart catheterization in June 2022 noted nonobstructive disease in the LAD-suspected microvascular angina/coronary spasm. Medications reconciled. Agree with cardiothoracic surgery that if he does need surgical interventions will need to undergo left heart catheterization to reevaluate his coronary artery disease to see if he needs CABG.  For now the plan is to undergo endovascular intervention with vascular surgery.  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 Home and office blood pressures are well-controlled. Noted on a CT scan from December 2024. Has seen cardiothoracic surgery as well as vascular surgery Patient plans to undergo 09/09/23: THORACIC AORTIC ENDOVASCULAR STENT GRAFT WITH THORACIC BRANCH ENDOPROSTHESIS  Mixed hyperlipidemia Currently on atorvastatin 80 mg p.o. daily.   He denies myalgia or other side effects. Cardiology is following peripherally.  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 Monitor for now.  Parkinson's disease, unspecified whether dyskinesia present, unspecified whether manifestations fluctuate (HCC) Clinically appears to be stable. Currently on medical therapy.  Orders Placed:  No orders of the defined types were placed in this encounter.  I spent 35 minutes in the care of Trini Christiansen. today including reviewing studies (echocardiogram January 2025), reviewing records from Dr. Laneta Simmers from 08/11/2023 and Dr. Myra Gianotti from 08/24/2023 ( ), and documenting in the encounter.   Final Medication List:    Meds ordered this encounter  Medications   amLODipine (NORVASC) 5 MG tablet    Sig: Take 1 tablet (5 mg total) by  mouth every evening.    Dispense:  90 tablet    Refill:  3   carvedilol (COREG) 6.25 MG tablet    Sig: Take 1 tablet (6.25 mg total) by mouth 2 (two) times daily. Hold if systolic blood pressure (top number) less than 100 mmHg or pulse less than 60 bpm.  Dispense:  180 tablet    Refill:  3   isosorbide mononitrate (IMDUR) 30 MG 24 hr tablet    Sig: Take 1 tablet (30 mg total) by mouth daily.    Dispense:  90 tablet    Refill:  3    Medications Discontinued During This Encounter  Medication Reason   isosorbide mononitrate (IMDUR) 30 MG 24 hr tablet Reorder   amLODipine (NORVASC) 5 MG tablet Reorder   carvedilol (COREG) 6.25 MG tablet Reorder     Current Outpatient Medications:    aspirin EC 81 MG tablet, Take 81 mg by mouth at bedtime., Disp: , Rfl:    atorvastatin (LIPITOR) 80 MG tablet, Take 80 mg by mouth every evening., Disp: , Rfl:    B Complex-C (SUPER B-C PO), Take 1 tablet by mouth every evening., Disp: , Rfl:    carbidopa-levodopa (SINEMET IR) 25-100 MG tablet, Take 1-2 tablets by mouth as directed. Take 2 tablets in the morning, take 1 tablet daily at 11 am, take 1 tablet daily at 3 pm & take 2 tablets at 6 pm., Disp: , Rfl:    Carbidopa-Levodopa ER (SINEMET CR) 25-100 MG tablet controlled release, Take 1 tablet by mouth at bedtime., Disp: , Rfl:    cromolyn (NASALCROM) 5.2 MG/ACT nasal spray, Place 1 spray into both nostrils 4 (four) times daily as needed for allergies., Disp: , Rfl:    desloratadine (CLARINEX) 5 MG tablet, Take 5 mg by mouth at bedtime., Disp: , Rfl:    EPINEPHrine 0.3 mg/0.3 mL IJ SOAJ injection, Inject 0.3 mg into the muscle as needed for anaphylaxis. , Disp: , Rfl:    ferrous sulfate 325 (65 FE) MG tablet, Take 325 mg by mouth every other day. At night., Disp: , Rfl:    ipratropium (ATROVENT) 0.03 % nasal spray, Place 2 sprays into both nostrils 2 (two) times daily as needed (allergies.)., Disp: , Rfl:    Multiple Vitamin (MULTIVITAMIN WITH MINERALS)  TABS tablet, Take 1 tablet by mouth in the morning., Disp: , Rfl:    nitroGLYCERIN (NITROSTAT) 0.4 MG SL tablet, Place 0.4 mg under the tongue every 5 (five) minutes as needed for chest pain. , Disp: , Rfl:    Omega-3 Fatty Acids (OMEGA-3 PO), Take 1 capsule by mouth in the morning., Disp: , Rfl:    omeprazole (PRILOSEC) 40 MG capsule, Take 40 mg by mouth at bedtime., Disp: , Rfl:    Polyethyl Glycol-Propyl Glycol (LUBRICANT EYE DROPS) 0.4-0.3 % SOLN, Place 1-2 drops into both eyes 3 (three) times daily as needed (dry/irritated eyes.)., Disp: , Rfl:    polyethylene glycol (MIRALAX) 17 g packet, Take 17 g by mouth in the morning., Disp: , Rfl:    tamsulosin (FLOMAX) 0.4 MG CAPS capsule, Take 0.4 mg by mouth at bedtime., Disp: , Rfl:    zolpidem (AMBIEN) 10 MG tablet, Take 10 mg by mouth at bedtime as needed for sleep., Disp: , Rfl:    amLODipine (NORVASC) 5 MG tablet, Take 1 tablet (5 mg total) by mouth every evening., Disp: 90 tablet, Rfl: 3   carvedilol (COREG) 6.25 MG tablet, Take 1 tablet (6.25 mg total) by mouth 2 (two) times daily. Hold if systolic blood pressure (top number) less than 100 mmHg or pulse less than 60 bpm., Disp: 180 tablet, Rfl: 3   isosorbide mononitrate (IMDUR) 30 MG 24 hr tablet, Take 1 tablet (30 mg total) by mouth daily., Disp: 90 tablet, Rfl: 3  Consent:   NA  Disposition:    1 year follow up.   His questions and concerns were addressed to his satisfaction. He voices understanding of the recommendations provided during this encounter.    Signed, Tessa Lerner, DO, [facility]  [facility]  [address] [place], [place] [address] 09/07/2023 11:40 AM

## 2023-09-07 NOTE — Patient Instructions (Signed)
Medication Instructions:  Refilled Imdur 30 mg  Refilled amlodipine 5 mg Refilled Coreg 6.25 mg   *If you need a refill on your cardiac medications before your next appointment, please call your pharmacy*   Follow-Up: At [facility], you and your health needs are our priority.  As part of our continuing mission to provide you with exceptional heart care, we have created designated Provider Care Teams.  These Care Teams include your primary Cardiologist (physician) and Advanced Practice Providers (APPs -  Physician Assistants and Nurse Practitioners) who all work together to provide you with the care you need, when you need it.  Your next appointment:   1 year(s)  Provider:   Tessa Lerner, DO     Other Instructions   1st Floor: - Lobby - Registration  - Pharmacy  - Lab - Cafe  2nd Floor: - PV Lab - Diagnostic Testing (echo, CT, nuclear med)  3rd Floor: - Vacant  4th Floor: - TCTS (cardiothoracic surgery) - AFib Clinic - Structural Heart Clinic - Vascular Surgery  - Vascular Ultrasound  5th Floor: - HeartCare Cardiology (general and EP) - Clinical Pharmacy for coumadin, hypertension, lipid, weight-loss medications, and med management appointments    Valet parking services will be available as well.

## 2023-09-09 ENCOUNTER — Inpatient Hospital Stay (HOSPITAL_COMMUNITY): Payer: No Typology Code available for payment source | Admitting: Anesthesiology

## 2023-09-09 ENCOUNTER — Inpatient Hospital Stay (HOSPITAL_COMMUNITY): Payer: No Typology Code available for payment source | Admitting: Physician Assistant

## 2023-09-09 ENCOUNTER — Inpatient Hospital Stay (HOSPITAL_COMMUNITY): Payer: No Typology Code available for payment source

## 2023-09-09 ENCOUNTER — Inpatient Hospital Stay (HOSPITAL_COMMUNITY)
Admission: RE | Admit: 2023-09-09 | Discharge: 2023-09-10 | DRG: 221 | Disposition: A | Discharge status: Home Health | Payer: No Typology Code available for payment source | Attending: Surgery | Admitting: Surgery

## 2023-09-09 ENCOUNTER — Other Ambulatory Visit: Payer: Self-pay

## 2023-09-09 ENCOUNTER — Encounter (HOSPITAL_COMMUNITY): Payer: Self-pay | Admitting: Surgery

## 2023-09-09 ENCOUNTER — Encounter (HOSPITAL_COMMUNITY)
Admission: RE | Disposition: A | Discharge status: Home Health | Payer: Self-pay | Source: Home / Self Care | Attending: Surgery

## 2023-09-09 DIAGNOSIS — Z880 Allergy status to penicillin: Secondary | ICD-10-CM

## 2023-09-09 DIAGNOSIS — E7800 Pure hypercholesterolemia, unspecified: Secondary | ICD-10-CM | POA: Diagnosis present

## 2023-09-09 DIAGNOSIS — Z83438 Family history of other disorder of lipoprotein metabolism and other lipidemia: Secondary | ICD-10-CM

## 2023-09-09 DIAGNOSIS — K219 Gastro-esophageal reflux disease without esophagitis: Secondary | ICD-10-CM | POA: Diagnosis present

## 2023-09-09 DIAGNOSIS — Z95828 Presence of other vascular implants and grafts: Principal | ICD-10-CM

## 2023-09-09 DIAGNOSIS — Z8249 Family history of ischemic heart disease and other diseases of the circulatory system: Secondary | ICD-10-CM | POA: Diagnosis not present

## 2023-09-09 DIAGNOSIS — I2510 Atherosclerotic heart disease of native coronary artery without angina pectoris: Secondary | ICD-10-CM

## 2023-09-09 DIAGNOSIS — G20A1 Parkinson's disease without dyskinesia, without mention of fluctuations: Secondary | ICD-10-CM | POA: Diagnosis present

## 2023-09-09 DIAGNOSIS — Z808 Family history of malignant neoplasm of other organs or systems: Secondary | ICD-10-CM | POA: Diagnosis not present

## 2023-09-09 DIAGNOSIS — I7123 Aneurysm of the descending thoracic aorta, without rupture: Secondary | ICD-10-CM | POA: Diagnosis present

## 2023-09-09 DIAGNOSIS — I10 Essential (primary) hypertension: Secondary | ICD-10-CM | POA: Diagnosis present

## 2023-09-09 DIAGNOSIS — Z91018 Allergy to other foods: Secondary | ICD-10-CM

## 2023-09-09 DIAGNOSIS — Z91010 Allergy to peanuts: Secondary | ICD-10-CM | POA: Diagnosis not present

## 2023-09-09 DIAGNOSIS — Z886 Allergy status to analgesic agent status: Secondary | ICD-10-CM | POA: Diagnosis not present

## 2023-09-09 DIAGNOSIS — Z7982 Long term (current) use of aspirin: Secondary | ICD-10-CM

## 2023-09-09 DIAGNOSIS — Z79899 Other long term (current) drug therapy: Secondary | ICD-10-CM | POA: Diagnosis not present

## 2023-09-09 DIAGNOSIS — Z87891 Personal history of nicotine dependence: Secondary | ICD-10-CM | POA: Diagnosis not present

## 2023-09-09 DIAGNOSIS — I7121 Aneurysm of the ascending aorta, without rupture: Secondary | ICD-10-CM | POA: Diagnosis not present

## 2023-09-09 DIAGNOSIS — I712 Thoracic aortic aneurysm, without rupture, unspecified: Secondary | ICD-10-CM | POA: Diagnosis present

## 2023-09-09 HISTORY — PX: COLECTOMY, SIGMOID, ROBOT-ASSISTED: SHX7311

## 2023-09-09 HISTORY — PX: THORACIC AORTIC ENDOVASCULAR STENT GRAFT: SHX6112

## 2023-09-09 HISTORY — PX: ULTRASOUND GUIDANCE FOR VASCULAR ACCESS: SHX6516

## 2023-09-09 LAB — BASIC METABOLIC PANEL
Anion gap: 11 (ref 5–15)
BUN: 18 mg/dL (ref 8–23)
CO2: 22 mmol/L (ref 22–32)
Calcium: 8.2 mg/dL — ABNORMAL LOW (ref 8.9–10.3)
Chloride: 105 mmol/L (ref 98–111)
Creatinine, Ser: 1.13 mg/dL (ref 0.61–1.24)
GFR, Estimated: >60 mL/min (ref >60)
Glucose, Bld: 104 mg/dL — ABNORMAL HIGH (ref 70–99)
Potassium: 4.2 mmol/L (ref 3.5–5.1)
Sodium: 138 mmol/L (ref 135–145)

## 2023-09-09 LAB — CBC
HCT: 39 % (ref 39.0–52.0)
Hemoglobin: 13.9 g/dL (ref 13.0–17.0)
MCH: 33.4 pg (ref 26.0–34.0)
MCHC: 35.6 g/dL (ref 30.0–36.0)
MCV: 93.8 fL (ref 80.0–100.0)
Platelets: 142 K/uL — ABNORMAL LOW (ref 150–400)
RBC: 4.16 MIL/uL — ABNORMAL LOW (ref 4.22–5.81)
RDW: 12.1 % (ref 11.5–15.5)
WBC: 5.9 K/uL (ref 4.0–10.5)
nRBC: 0 % (ref 0.0–0.2)

## 2023-09-09 LAB — MAGNESIUM: Magnesium: 1.9 mg/dL (ref 1.7–2.4)

## 2023-09-09 LAB — APTT: aPTT: 36 s (ref 24–36)

## 2023-09-09 LAB — PROTIME-INR
INR: 1.2 (ref 0.8–1.2)
Prothrombin Time: 15.6 s — ABNORMAL HIGH (ref 11.4–15.2)

## 2023-09-09 SURGERY — INSERTION, ENDOVASCULAR STENT GRAFT, AORTA, THORACIC
Anesthesia: General | Site: Groin | Laterality: Left

## 2023-09-09 MED ORDER — CARBIDOPA-LEVODOPA 25-100 MG PO TABS
1.0000 | ORAL_TABLET | Freq: Two times a day (BID) | ORAL | Status: DC
  Administered 2023-09-09: 1 via ORAL
  Filled 2023-09-09 (×3): qty 1

## 2023-09-09 MED ORDER — LABETALOL HCL 5 MG/ML IV SOLN: 10.0000 mg | INTRAVENOUS | Status: DC | PRN

## 2023-09-09 MED ORDER — ONDANSETRON HCL 4 MG/2ML IJ SOLN
INTRAMUSCULAR | Status: DC | PRN
  Administered 2023-09-09: 4 mg via INTRAVENOUS

## 2023-09-09 MED ORDER — MAGNESIUM SULFATE 2 GM/50ML IV SOLN: 2.0000 g | Freq: Every day | INTRAVENOUS | Status: DC | PRN

## 2023-09-09 MED ORDER — VANCOMYCIN HCL IN DEXTROSE 1-5 GM/200ML-% IV SOLN
1000.0000 mg | Freq: Once | INTRAVENOUS | Status: AC
  Administered 2023-09-09: 1000 mg via INTRAVENOUS
  Filled 2023-09-09: qty 200

## 2023-09-09 MED ORDER — DEXAMETHASONE SODIUM PHOSPHATE 10 MG/ML IJ SOLN
INTRAMUSCULAR | Status: DC | PRN
  Administered 2023-09-09: 10 mg via INTRAVENOUS

## 2023-09-09 MED ORDER — CHLORHEXIDINE GLUCONATE CLOTH 2 % EX PADS: 6.0000 | MEDICATED_PAD | Freq: Once | CUTANEOUS | Status: DC

## 2023-09-09 MED ORDER — ACETAMINOPHEN 325 MG RE SUPP
325.0000 mg | RECTAL | Status: DC | PRN
  Filled 2023-09-09: qty 2

## 2023-09-09 MED ORDER — ASPIRIN 81 MG PO TBEC: 81.0000 mg | DELAYED_RELEASE_TABLET | Freq: Every day | ORAL | Status: DC

## 2023-09-09 MED ORDER — CARBIDOPA-LEVODOPA 25-100 MG PO TABS
2.0000 | ORAL_TABLET | Freq: Two times a day (BID) | ORAL | Status: DC
  Administered 2023-09-09 – 2023-09-10 (×2): 2 via ORAL
  Filled 2023-09-09 (×2): qty 2

## 2023-09-09 MED ORDER — HEPARIN 6000 UNIT IRRIGATION SOLUTION
Status: DC | PRN
  Administered 2023-09-09: 1

## 2023-09-09 MED ORDER — ONDANSETRON HCL 4 MG/2ML IJ SOLN: 4.0000 mg | Freq: Four times a day (QID) | INTRAMUSCULAR | Status: DC | PRN

## 2023-09-09 MED ORDER — CARVEDILOL 6.25 MG PO TABS
6.2500 mg | ORAL_TABLET | Freq: Two times a day (BID) | ORAL | Status: DC
  Administered 2023-09-09 – 2023-09-10 (×2): 6.25 mg via ORAL
  Filled 2023-09-09 (×2): qty 1

## 2023-09-09 MED ORDER — ATORVASTATIN CALCIUM 80 MG PO TABS
80.0000 mg | ORAL_TABLET | Freq: Every evening | ORAL | Status: DC
  Administered 2023-09-09: 80 mg via ORAL
  Filled 2023-09-09: qty 1

## 2023-09-09 MED ORDER — ACETAMINOPHEN 500 MG PO TABS
1000.0000 mg | ORAL_TABLET | Freq: Once | ORAL | Status: AC
  Administered 2023-09-09: 1000 mg via ORAL
  Filled 2023-09-09: qty 2

## 2023-09-09 MED ORDER — SODIUM CHLORIDE 0.9 % IV SOLN: INTRAVENOUS | Status: DC

## 2023-09-09 MED ORDER — HYDRALAZINE HCL 20 MG/ML IJ SOLN: 5.0000 mg | INTRAMUSCULAR | Status: DC | PRN

## 2023-09-09 MED ORDER — DOCUSATE SODIUM 100 MG PO CAPS
100.0000 mg | ORAL_CAPSULE | Freq: Every day | ORAL | Status: DC
  Administered 2023-09-10: 100 mg via ORAL
  Filled 2023-09-09: qty 1

## 2023-09-09 MED ORDER — PHENYLEPHRINE HCL-NACL 20-0.9 MG/250ML-% IV SOLN
INTRAVENOUS | Status: DC | PRN
  Administered 2023-09-09: 50 ug/min via INTRAVENOUS

## 2023-09-09 MED ORDER — AMLODIPINE BESYLATE 5 MG PO TABS: 5.0000 mg | ORAL_TABLET | Freq: Every evening | ORAL | Status: DC

## 2023-09-09 MED ORDER — CHLORHEXIDINE GLUCONATE CLOTH 2 % EX PADS
6.0000 | MEDICATED_PAD | Freq: Every day | CUTANEOUS | Status: DC
  Administered 2023-09-09: 6 via TOPICAL

## 2023-09-09 MED ORDER — HEPARIN SODIUM (PORCINE) 5000 UNIT/ML IJ SOLN
5000.0000 [IU] | Freq: Three times a day (TID) | INTRAMUSCULAR | Status: DC
  Administered 2023-09-10: 5000 [IU] via SUBCUTANEOUS
  Filled 2023-09-09: qty 1

## 2023-09-09 MED ORDER — ORAL CARE MOUTH RINSE: 15.0000 mL | Freq: Once | OROMUCOSAL | Status: AC

## 2023-09-09 MED ORDER — LORATADINE 10 MG PO TABS
10.0000 mg | ORAL_TABLET | Freq: Every day | ORAL | Status: DC
  Administered 2023-09-10: 10 mg via ORAL
  Filled 2023-09-09: qty 1

## 2023-09-09 MED ORDER — OXYCODONE HCL 5 MG PO TABS: 5.0000 mg | ORAL_TABLET | ORAL | Status: DC | PRN

## 2023-09-09 MED ORDER — PROTAMINE SULFATE 10 MG/ML IV SOLN
INTRAVENOUS | Status: AC
  Filled 2023-09-09: qty 5

## 2023-09-09 MED ORDER — PROPOFOL 10 MG/ML IV BOLUS
INTRAVENOUS | Status: DC | PRN
  Administered 2023-09-09: 110 mg via INTRAVENOUS

## 2023-09-09 MED ORDER — ROCURONIUM BROMIDE 10 MG/ML (PF) SYRINGE
PREFILLED_SYRINGE | INTRAVENOUS | Status: DC | PRN
  Administered 2023-09-09: 60 mg via INTRAVENOUS
  Administered 2023-09-09: 40 mg via INTRAVENOUS

## 2023-09-09 MED ORDER — FENTANYL CITRATE (PF) 250 MCG/5ML IJ SOLN
INTRAMUSCULAR | Status: DC | PRN
  Administered 2023-09-09: 100 ug via INTRAVENOUS
  Administered 2023-09-09: 50 ug via INTRAVENOUS

## 2023-09-09 MED ORDER — SODIUM CHLORIDE 0.9 % IV SOLN: 500.0000 mL | Freq: Once | INTRAVENOUS | Status: DC | PRN

## 2023-09-09 MED ORDER — FENTANYL CITRATE (PF) 250 MCG/5ML IJ SOLN
INTRAMUSCULAR | Status: AC
  Filled 2023-09-09: qty 5

## 2023-09-09 MED ORDER — POTASSIUM CHLORIDE CRYS ER 20 MEQ PO TBCR: 20.0000 meq | EXTENDED_RELEASE_TABLET | Freq: Every day | ORAL | Status: DC | PRN

## 2023-09-09 MED ORDER — PANTOPRAZOLE SODIUM 40 MG PO TBEC
40.0000 mg | DELAYED_RELEASE_TABLET | Freq: Every day | ORAL | Status: DC
  Administered 2023-09-10: 40 mg via ORAL
  Filled 2023-09-09: qty 1

## 2023-09-09 MED ORDER — PHENOL 1.4 % MT LIQD: 1.0000 | OROMUCOSAL | Status: DC | PRN

## 2023-09-09 MED ORDER — ISOSORBIDE MONONITRATE ER 30 MG PO TB24
30.0000 mg | ORAL_TABLET | Freq: Every day | ORAL | Status: DC
  Administered 2023-09-10: 30 mg via ORAL
  Filled 2023-09-09: qty 1

## 2023-09-09 MED ORDER — METOPROLOL TARTRATE 5 MG/5ML IV SOLN: 2.0000 mg | INTRAVENOUS | Status: DC | PRN

## 2023-09-09 MED ORDER — CHLORHEXIDINE GLUCONATE 0.12 % MT SOLN
15.0000 mL | Freq: Once | OROMUCOSAL | Status: AC
Start: 2023-09-09 — End: 2023-09-09
  Administered 2023-09-09: 15 mL via OROMUCOSAL
  Filled 2023-09-09: qty 15

## 2023-09-09 MED ORDER — GUAIFENESIN-DM 100-10 MG/5ML PO SYRP
15.0000 mL | ORAL_SOLUTION | ORAL | Status: DC | PRN
Start: 2023-09-09 — End: 2023-09-10

## 2023-09-09 MED ORDER — CARBIDOPA-LEVODOPA ER 25-100 MG PO TBCR
1.0000 | EXTENDED_RELEASE_TABLET | Freq: Every day | ORAL | Status: DC
  Administered 2023-09-09: 1 via ORAL
  Filled 2023-09-09 (×2): qty 1

## 2023-09-09 MED ORDER — HEPARIN 6000 UNIT IRRIGATION SOLUTION
Status: AC
  Filled 2023-09-09: qty 500

## 2023-09-09 MED ORDER — VANCOMYCIN HCL IN DEXTROSE 1-5 GM/200ML-% IV SOLN
1000.0000 mg | INTRAVENOUS | Status: AC
  Administered 2023-09-09: 1000 mg via INTRAVENOUS
  Filled 2023-09-09: qty 200

## 2023-09-09 MED ORDER — ACETAMINOPHEN 325 MG PO TABS: 325.0000 mg | ORAL_TABLET | ORAL | Status: DC | PRN

## 2023-09-09 MED ORDER — TAMSULOSIN HCL 0.4 MG PO CAPS
0.4000 mg | ORAL_CAPSULE | Freq: Every day | ORAL | Status: DC
  Filled 2023-09-09: qty 1

## 2023-09-09 MED ORDER — MORPHINE SULFATE (PF) 2 MG/ML IV SOLN: 2.0000 mg | INTRAVENOUS | Status: DC | PRN

## 2023-09-09 MED ORDER — ALUM & MAG HYDROXIDE-SIMETH 200-200-20 MG/5ML PO SUSP: 15.0000 mL | ORAL | Status: DC | PRN

## 2023-09-09 MED ORDER — PROTAMINE SULFATE 10 MG/ML IV SOLN
INTRAVENOUS | Status: DC | PRN
  Administered 2023-09-09: 50 mg via INTRAVENOUS

## 2023-09-09 MED ORDER — HEPARIN SODIUM (PORCINE) 1000 UNIT/ML IJ SOLN
INTRAMUSCULAR | Status: DC | PRN
  Administered 2023-09-09: 3000 [IU] via INTRAVENOUS
  Administered 2023-09-09: 8000 [IU] via INTRAVENOUS

## 2023-09-09 MED ORDER — PROPOFOL 500 MG/50ML IV EMUL
INTRAVENOUS | Status: DC | PRN
  Administered 2023-09-09: 50 ug/kg/min via INTRAVENOUS

## 2023-09-09 MED ORDER — HEPARIN SODIUM (PORCINE) 1000 UNIT/ML IJ SOLN
INTRAMUSCULAR | Status: AC
  Filled 2023-09-09: qty 10

## 2023-09-09 MED ORDER — PHENYLEPHRINE 80 MCG/ML (10ML) SYRINGE FOR IV PUSH (FOR BLOOD PRESSURE SUPPORT)
PREFILLED_SYRINGE | INTRAVENOUS | Status: DC | PRN
  Administered 2023-09-09 (×3): 80 ug via INTRAVENOUS

## 2023-09-09 MED ORDER — LACTATED RINGERS IV SOLN: INTRAVENOUS | Status: DC

## 2023-09-09 MED ORDER — LIDOCAINE 2% (20 MG/ML) 5 ML SYRINGE
INTRAMUSCULAR | Status: DC | PRN
  Administered 2023-09-09: 60 mg via INTRAVENOUS

## 2023-09-09 MED ORDER — CEFAZOLIN SODIUM-DEXTROSE 2-4 GM/100ML-% IV SOLN: 2.0000 g | Freq: Three times a day (TID) | INTRAVENOUS | Status: DC

## 2023-09-09 MED ORDER — IODIXANOL 320 MG/ML IV SOLN
INTRAVENOUS | Status: DC | PRN
  Administered 2023-09-09: 61.4 mL via INTRA_ARTERIAL

## 2023-09-09 SURGICAL SUPPLY — 42 items
BAG COUNTER SPONGE SURGICOUNT (BAG) ×2 IMPLANT
BNDG COHESIVE 1X5 TAN STRL LF (GAUZE/BANDAGES/DRESSINGS) IMPLANT
CANISTER SUCT 3000ML PPV (MISCELLANEOUS) ×2 IMPLANT
CATH ACCU-VU SIZ PIG 5F 100CM (CATHETERS) ×2 IMPLANT
CATH ANGIO COBRA1 5X65X.035 (CATHETERS) IMPLANT
CATH HEADHUNTER H1 5F 100CM (CATHETERS) IMPLANT
CLOSURE PERCLOSE PROSTYLE (VASCULAR PRODUCTS) IMPLANT
COVER PROBE W GEL 5X96 (DRAPES) ×2 IMPLANT
DERMABOND ADVANCED .7 DNX12 (GAUZE/BANDAGES/DRESSINGS) ×2 IMPLANT
DEVICE CLOSURE PERCLS PRGLD 6F (VASCULAR PRODUCTS) ×4 IMPLANT
DEVICE ENSNARE 12MMX20MM (VASCULAR PRODUCTS) IMPLANT
DEVICE INFLATION ENCORE 26 (MISCELLANEOUS) IMPLANT
DEVICE VASC CLSR CELT ART 5 (Vascular Products) IMPLANT
DRSG TEGADERM 2-3/8X2-3/4 SM (GAUZE/BANDAGES/DRESSINGS) ×2 IMPLANT
ELECT REM PT RETURN 9FT ADLT (ELECTROSURGICAL) ×4
ELECTRODE REM PT RTRN 9FT ADLT (ELECTROSURGICAL) ×4 IMPLANT
ENDOPROTH THORACIC 12X6X8 14F (Endovascular Graft) IMPLANT
ENDOPROTH THORACIC 34X15X8 24F (Endovascular Graft) IMPLANT
GAUZE SPONGE 4X4 12PLY STRL (GAUZE/BANDAGES/DRESSINGS) IMPLANT
GLOVE SURG SS PI 7.5 STRL IVOR (GLOVE) ×6 IMPLANT
GOWN STRL REUS W/ TWL LRG LVL3 (GOWN DISPOSABLE) ×4 IMPLANT
GOWN STRL REUS W/ TWL XL LVL3 (GOWN DISPOSABLE) ×2 IMPLANT
GUIDEWIRE JAGWIRE HP (WIRE) IMPLANT
KIT BASIN OR (CUSTOM PROCEDURE TRAY) ×2 IMPLANT
KIT TURNOVER KIT B (KITS) ×2 IMPLANT
NS IRRIG 1000ML POUR BTL (IV SOLUTION) ×2 IMPLANT
PACK ENDOVASCULAR (PACKS) ×2 IMPLANT
PAD ARMBOARD 7.5X6 YLW CONV (MISCELLANEOUS) ×4 IMPLANT
PENCIL BUTTON HOLSTER BLD 10FT (ELECTRODE) IMPLANT
PERCLOSE PROGLIDE 6F (VASCULAR PRODUCTS) ×4
SET MICROPUNCTURE 5F STIFF (MISCELLANEOUS) ×2 IMPLANT
SHEATH DRYSEAL FLEX 22FR 33CM (SHEATH) IMPLANT
SHEATH PINNACLE 5F 10CM (SHEATH) IMPLANT
SHEATH PINNACLE 8F 10CM (SHEATH) ×2 IMPLANT
STOPCOCK MORSE 400PSI 3WAY (MISCELLANEOUS) ×2 IMPLANT
TOWEL GREEN STERILE (TOWEL DISPOSABLE) ×2 IMPLANT
TUBE CONNECTING 20X1/4 (TUBING) IMPLANT
TUBING HIGH PRESSURE 120CM (CONNECTOR) ×2 IMPLANT
TUBING INJECTOR 48 (MISCELLANEOUS) IMPLANT
WIRE BENTSON .035X145CM (WIRE) ×2 IMPLANT
WIRE STIFF LUNDERQUIST 260CM (WIRE) ×2 IMPLANT
WIRE TORQFLEX AUST .018X40CM (WIRE) IMPLANT

## 2023-09-09 NOTE — Anesthesia Procedure Notes (Signed)
Arterial Line Insertion Start/End1/29/2025 7:00 AM, 09/09/2023 7:05 AM Performed by: Darryl Nestle, CRNA, CRNA  Patient location: Pre-op. Preanesthetic checklist: patient identified, IV checked, site marked, risks and benefits discussed, surgical consent, monitors and equipment checked, pre-op evaluation, timeout performed and anesthesia consent Lidocaine 1% used for infiltration Right, radial was placed Catheter size: 20 G Hand hygiene performed  and maximum sterile barriers used   Attempts: 1 Procedure performed without using ultrasound guided technique. Following insertion, dressing applied and Biopatch. Post procedure assessment: normal and unchanged  Patient tolerated the procedure well with no immediate complications.

## 2023-09-09 NOTE — Anesthesia Postprocedure Evaluation (Signed)
Anesthesia Post Note  Patient: Randall Moore.  Procedure(s) Performed: THORACIC AORTIC ENDOVASCULAR STENT GRAFT WITH THORACIC BRANCH ENDOPROSTHESIS (Bilateral: Groin) ULTRASOUND GUIDANCE FOR BILATERAL FEMORAL ACCESS (Bilateral: Groin) ULTRASOUND GUIDANCE LEFT RADIAL ARTERY (Left: Arm Lower)     Patient location during evaluation: PACU Anesthesia Type: General Level of consciousness: awake and alert, patient cooperative and oriented Pain management: pain level controlled Vital Signs Assessment: post-procedure vital signs reviewed and stable Respiratory status: spontaneous breathing, nonlabored ventilation, respiratory function stable and patient connected to nasal cannula oxygen Cardiovascular status: blood pressure returned to baseline and stable Postop Assessment: no apparent nausea or vomiting Anesthetic complications: no   No notable events documented.  Last Vitals:  Vitals:   09/09/23 1015 09/09/23 1030  BP: (!) 149/69 (!) 153/70  Pulse: 70 66  Resp: 12 12  Temp:    SpO2: 96% 97%    Last Pain:  Vitals:   09/09/23 1030  TempSrc:   PainSc: 0-No pain                 Arlene Genova,E. Belva Koziel

## 2023-09-09 NOTE — Progress Notes (Signed)
  Progress Note    09/09/2023 12:53 PM * Day of Surgery *  Subjective:  says he feels fine. Denies any pain. Still very sleepy    Vitals:   09/09/23 1200 09/09/23 1230  BP: (!) 144/68 (!) 148/74  Pulse: 69 69  Resp: 12 12  Temp:  97.7 F (36.5 C)  SpO2: 94% 95%    Physical Exam: General:  laying comfortably, sleepy Cardiac:  regular Lungs:  nonlabored Incisions:  bilateral groin cath sites soft without hematoma. Left radial cath site bandaged, without hematoma Extremities:  intact left radial doppler signal. Intact bilateral PT doppler signals Neuro: intact motor and sensation of BLE  CBC    Component Value Date/Time   WBC 5.9 09/03/2023 0922   RBC 4.88 09/03/2023 0922   HGB 16.2 09/03/2023 0922   HGB 14.5 01/11/2021 1216   HCT 45.7 09/03/2023 0922   HCT 41.6 01/11/2021 1216   PLT 166 09/03/2023 0922   PLT 179 01/11/2021 1216   MCV 93.6 09/03/2023 0922   MCV 96 01/11/2021 1216   MCH 33.2 09/03/2023 0922   MCHC 35.4 09/03/2023 0922   RDW 11.9 09/03/2023 0922   RDW 13.0 01/11/2021 1216   LYMPHSABS 0.2 (L) 07/24/2023 1945   MONOABS 0.3 07/24/2023 1945   EOSABS 0.2 07/24/2023 1945   BASOSABS 0.0 07/24/2023 1945    BMET    Component Value Date/Time   NA 140 09/03/2023 0922   NA 141 01/11/2021 1216   K 3.9 09/03/2023 0922   CL 104 09/03/2023 0922   CO2 28 09/03/2023 0922   GLUCOSE 128 (H) 09/03/2023 0922   BUN 12 09/03/2023 0922   BUN 16 01/11/2021 1216   CREATININE 1.18 09/03/2023 0922   CALCIUM 8.9 09/03/2023 0922   GFRNONAA >60 09/03/2023 0922   GFRAA >60 01/28/2019 2131    INR    Component Value Date/Time   INR 1.0 09/03/2023 0922     Intake/Output Summary (Last 24 hours) at 09/09/2023 1253 Last data filed at 09/09/2023 1200 Gross per 24 hour  Intake 1100 ml  Output 1130 ml  Net -30 ml      Assessment/Plan:  78 y.o. male is s/p: TEVAR with thoracic branch   -Patient is doing well in PACU. Denies any pain -Bilateral groin and left  radial cath sites are soft without hematoma -BLE warm and well perfused with intact PT doppler signals. Motor and sensation intact. Can wiggle toes and SLR -Left hand well perfused with intact radial doppler signal. Intact sensation. Grip strength 5/5 -Will move to 4E later this afternoon -Likely discharge tomorrow as long as there are no issues overnight and he is mobilizing well. Will arrange 4 week CTA chest follow up    Loel Dubonnet, PA-C [facility] [phone] 09/09/2023 12:53 PM

## 2023-09-09 NOTE — Anesthesia Procedure Notes (Signed)
Procedure Name: Intubation Date/Time: 09/09/2023 7:50 AM  Performed by: Darryl Nestle, CRNAPre-anesthesia Checklist: Patient identified, Emergency Drugs available, Suction available and Patient being monitored Patient Re-evaluated:Patient Re-evaluated prior to induction Oxygen Delivery Method: Circle system utilized Preoxygenation: Pre-oxygenation with 100% oxygen Induction Type: IV induction and Cricoid Pressure applied Ventilation: Mask ventilation with difficulty Laryngoscope Size: Mac and 4 Grade View: Grade I Tube type: Oral Tube size: 7.0 mm Number of attempts: 1 Airway Equipment and Method: Stylet and Oral airway Placement Confirmation: ETT inserted through vocal cords under direct vision, positive ETCO2 and breath sounds checked- equal and bilateral Secured at: 23 cm Tube secured with: Tape Dental Injury: Teeth and Oropharynx as per pre-operative assessment

## 2023-09-09 NOTE — Discharge Instructions (Signed)
[facility]   Discharge Instructions  Endovascular Aortic Aneurysm Repair  Please refer to the following instructions for your post-procedure care. Your surgeon or Physician Assistant will discuss any changes with you.  Activity  You are encouraged to walk as much as you can. You can slowly return to normal activities but must avoid strenuous activity and heavy lifting until your doctor tells you it's OK. Avoid activities such as vacuuming or swinging a gold club. It is normal to feel tired for several weeks after your surgery. Do not drive until your doctor gives the OK and you are no longer taking prescription pain medications. It is also normal to have difficulty with sleep habits, eating, and bowel movements after surgery. These will go away with time.  Bathing/Showering  Shower daily after you go home.  Do not soak in a bathtub, hot tub, or swim until the incision heals completely.  If you have incisions in your groin, wash the groin wounds with soap and water daily and pat dry. (No tub bath-only shower)  Then put a dry gauze or washcloth there to keep this area dry to help prevent wound infection daily and as needed.  Do not use Vaseline or neosporin on your incisions.  Only use soap and water on your incisions and then protect and keep dry.  Incision Care  Shower every day. Clean your incision with mild soap and water. Pat the area dry with a clean towel. You do not need a bandage unless otherwise instructed. Do not apply any ointments or creams to your incision. If you clothing is irritating, you may cover your incision with a dry gauze pad.  Diet  Resume your normal diet. There are no special food restrictions following this procedure. A low fat/low cholesterol diet is recommended for all patients with vascular disease. In order to heal from your surgery, it is CRITICAL to get adequate nutrition. Your body requires vitamins, minerals, and protein.  Vegetables are the best source of vitamins and minerals. Vegetables also provide the perfect balance of protein. Processed food has little nutritional value, so try to avoid this.  Medications  Resume taking all of your medications unless your doctor or nurse practitioner tells you not to. If your incision is causing pain, you may take over-the-counter pain relievers such as acetaminophen (Tylenol). If you were prescribed a stronger pain medication, please be aware these medications can cause nausea and constipation. Prevent nausea by taking the medication with a snack or meal. Avoid constipation by drinking plenty of fluids and eating foods with a high amount of fiber, such as fruits, vegetables, and grains.  Do not take Tylenol if you are taking prescription pain medications.   Follow up  Our office will schedule a follow-up appointment with a CT scan 3-4 weeks after your surgery.  Please call us immediately for any of the following conditions  Severe or worsening pain in your legs or feet or in your abdomen back or chest. Increased pain, redness, drainage (pus) from your incision site. Increased abdominal pain, bloating, nausea, vomiting or persistent diarrhea. Fever of 101 degrees or higher. Swelling in your leg (s),  Reduce your risk of vascular disease  Stop smoking. If you would like help call [facility] at 1-800-QUIT-NOW (747-660-9763) or [place] at [phone]. Manage your cholesterol Maintain a desired weight Control your diabetes Keep your blood pressure down  If you have questions, please call the office at [phone].

## 2023-09-09 NOTE — Op Note (Signed)
Patient name: Randall Moore. MRN: [id number] DOB: 05/13/1946 Sex: male  09/09/2023 Pre-operative Diagnosis: Thoracic aortic aneurysm Post-operative diagnosis:  Same Surgeon:  Durene Cal Assistants:  Antony Blackbird, PA Procedure:   #1:  Endovascular repair of thoracic aortic aneurysm with coverage of left subclavian artery (65784)   #2;  U/s guided access bilateral common femoral arteries   #3:  u/s guided access, left radial artery   #4:  Catheter selection I nleft SCA ([id number]) Anesthesia:  General Blood Loss:  minimal Specimens:  none  Findings:  complete exclusion  Devices used:  GORE TBE 34x34x15, 8mm portal.  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  We discussed treatment options including aortic arch reconstruction or thoracic sidebranch grafting.  He elected for the less invasive option.  He is here today for the procedure.  Procedure:  The patient was identified in the holding area and taken to [facility] OR ROOM 16  The patient was then placed supine on the table. general anesthesia was administered.  The patient was prepped and draped in the usual sterile fashion.  A time out was called and antibiotics were administered.  A PA was necessary to expedite the procedure and assist with technical details.  She help with wire exchanges gaining access and device deployment.  Ultrasound was used to evaluate bilateral common femoral arteries which are widely patent and easily compressible.  A micropuncture needle was used to access bilateral common femoral arteries.  An 018 wire was inserted without resistance.  On the right side, an 8 [place] dilator was used to dilate the subtenons tissue and Pro-glide devices were deployed at the 11:00 and 1 o'clock position followed by placement of a 8 [place] sheath.  On the left side, a 5 French sheath was placed.   Next, ultrasound was used to evaluate the left radial artery which was cannulated under ultrasound guidance with a micropuncture needle.  An 018 wire was inserted followed placement of micropuncture sheath.  A Bentson wire was then placed followed by a 5 [place] sheath.  Patient was then fully heparinized.  A Lunderquist double curve wire was advanced up the right side into the ascending aorta and a 22 French sheath was placed.  Next, a Al Pimple was advanced from the left wrist and directed into the descending thoracic aorta where it was snared with a tulip snare and brought out the sheath in the right groin.  Next, the main body device was prepared on the back table.  The Al Pimple was inserted into the device and the device was loaded on the Lunderquist wire and advanced into the aortic arch.  A pigtail catheter was advanced up the left side and placed in the ascending aorta and a aortic arch angiogram was performed locating the great vessels.  The device was then deployed landing at the level of the left carotid artery.  Next, a 8 mm balloon was advanced into the portal on the graft and inflated.  The balloon was removed and the sidebranch, 8 x 12 x 6 was then inserted and deployed into the left subclavian artery, landing proximal to the vertebral artery.  It was then molded with the 8 mm balloon.  A completion angiogram was not performed which showed preservation of blood flow through all 3 great vessels with complete exclusion of the ulceration.  At this point the  wires were all removed.  The sheath in the left groin was removed and closed with a Celt.  Pro-glide devices were delayed to close the right groin access and then manual pressure was held on the left radial artery when the sheath was removed.  We confirmed with Doppler that there were brisk pedal Doppler signals bilaterally.  The patient was given 50 mg of protamine to reverse the heparin.  He was then successfully extubated taken the recovery in stable  condition.  There were no immediate complications.   Disposition: To PACU stable   V. Durene Cal, M.D., [facility] [place] Office: [phone] Pager:  [phone]

## 2023-09-09 NOTE — Interval H&P Note (Signed)
History and Physical Interval Note:  09/09/2023 7:32 AM  Randall Moore.  has presented today for surgery, with the diagnosis of Aneurysm of descending thoracic aorta without rupture.  The various methods of treatment have been discussed with the patient and family. After consideration of risks, benefits and other options for treatment, the patient has consented to  Procedure(s): THORACIC AORTIC ENDOVASCULAR STENT GRAFT WITH THORACIC BRANCH ENDOPROSTHESIS (N/A) as a surgical intervention.  The patient's history has been reviewed, patient examined, no change in status, stable for surgery.  I have reviewed the patient's chart and labs.  Questions were answered to the patient's satisfaction.     Durene Cal

## 2023-09-09 NOTE — Transfer of Care (Signed)
Immediate Anesthesia Transfer of Care Note  Patient: Randall Moore.  Procedure(s) Performed: THORACIC AORTIC ENDOVASCULAR STENT GRAFT WITH THORACIC BRANCH ENDOPROSTHESIS (Bilateral: Groin) ULTRASOUND GUIDANCE FOR BILATERAL FEMORAL ACCESS (Bilateral: Groin) ULTRASOUND GUIDANCE LEFT RADIAL ARTERY (Left: Arm Lower)  Patient Location: PACU  Anesthesia Type:General  Level of Consciousness: awake and alert   Airway & Oxygen Therapy: Patient Spontanous Breathing  Post-op Assessment: Report given to RN and Post -op Vital signs reviewed and stable  Post vital signs: Reviewed and stable  Last Vitals:  Vitals Value Taken Time  BP 153/61 09/09/23 1008  Temp 98   Pulse 70 09/09/23 1015  Resp 12 09/09/23 1015  SpO2 96 % 09/09/23 1015  Vitals shown include unfiled device data.  Last Pain:  Vitals:   09/09/23 0620  TempSrc:   PainSc: 0-No pain      Patients Stated Pain Goal: 0 (09/09/23 1610)  Complications: No notable events documented.

## 2023-09-10 LAB — BASIC METABOLIC PANEL
Anion gap: 9 (ref 5–15)
BUN: 18 mg/dL (ref 8–23)
CO2: 22 mmol/L (ref 22–32)
Calcium: 8.3 mg/dL — ABNORMAL LOW (ref 8.9–10.3)
Chloride: 107 mmol/L (ref 98–111)
Creatinine, Ser: 1.52 mg/dL — ABNORMAL HIGH (ref 0.61–1.24)
GFR, Estimated: 47 mL/min — ABNORMAL LOW (ref >60)
Glucose, Bld: 122 mg/dL — ABNORMAL HIGH (ref 70–99)
Potassium: 3.7 mmol/L (ref 3.5–5.1)
Sodium: 138 mmol/L (ref 135–145)

## 2023-09-10 LAB — CBC
HCT: 38 % — ABNORMAL LOW (ref 39.0–52.0)
Hemoglobin: 13.6 g/dL (ref 13.0–17.0)
MCH: 32.9 pg (ref 26.0–34.0)
MCHC: 35.8 g/dL (ref 30.0–36.0)
MCV: 92 fL (ref 80.0–100.0)
Platelets: 149 10*3/uL — ABNORMAL LOW (ref 150–400)
RBC: 4.13 MIL/uL — ABNORMAL LOW (ref 4.22–5.81)
RDW: 11.9 % (ref 11.5–15.5)
WBC: 12.3 10*3/uL — ABNORMAL HIGH (ref 4.0–10.5)
nRBC: 0 % (ref 0.0–0.2)
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
## 2023-09-10 NOTE — TOC Transition Note (Signed)
Transition of Care [facility]) - Discharge Note Donn Pierini RN, BSN Transitions of Care Unit 4E- RN Case Manager See Treatment Team for direct phone #   Patient Details  Name: Randall Moore. MRN: [id number] Date of Birth: October 20, 1945  Transition of Care [facility]) CM/SW Contact:  Darrold Span, RN Phone Number: 09/10/2023, 10:20 AM   Clinical Narrative:    Pt stable for transition home today, CM notified by [facility] liaison that [facility]SS office made referral for [facility] needs. [facility] and will initiate [facility] protocol for HH. Liaison to reach out to pt.   No further TOC needs noted. Family to transport home.    Final next level of care: Home w Home Health Services Barriers to Discharge: No Barriers Identified   Patient Goals and CMS Choice     Choice offered to / list presented to : NA   [facility] office referral   Discharge Placement                 Home w/ [facility]      Discharge Plan and Services Additional resources added to the After Visit Summary for   In-house Referral: NA                          HH Agency: [facility] Date [facility] Agency Contacted: 09/10/23 Time HH Agency Contacted: 0900 Representative spoke with at [facility] Agency: Aggie Cosier  Social Drivers of Health (SDOH) Interventions SDOH Screenings   Food Insecurity: Patient Declined (09/10/2023)  Housing: Patient Declined (09/10/2023)  Transportation Needs: Patient Declined (09/10/2023)  Utilities: Patient Declined (09/10/2023)  Social Connections: Unknown (09/10/2023)  Tobacco Use: Medium Risk (09/09/2023)     Readmission Risk Interventions    09/10/2023   10:20 AM  Readmission Risk Prevention Plan  Post Dischage Appt Complete  Medication Screening Complete  Transportation Screening Complete

## 2023-09-10 NOTE — Progress Notes (Signed)
  Progress Note    09/10/2023 7:42 AM 1 Day Post-Op  Subjective:  says he feels great. Denies any pain. Got up to the bathroom without any issue. Tolerated dinner last night without difficulty swallowing    Vitals:   09/10/23 0021 09/10/23 0413  BP: 118/69 115/70  Pulse: 86 78  Resp: 16 15  Temp: 97.7 F (36.5 C) 97.9 F (36.6 C)  SpO2: 96% 96%    Physical Exam: General:  laying in bed comfortably, alert Cardiac:  regular Lungs:  nonlabored Incisions:  left radial cath site soft without hematoma. Bilateral groin cath sites soft without hematoma Extremities:  palpable left radial pulse and PT pulses bilaterally. Moving all extremities equally. Can SLR without issue Abdomen:  soft, NT, ND  CBC    Component Value Date/Time   WBC 12.3 (H) 09/10/2023 0420   RBC 4.13 (L) 09/10/2023 0420   HGB 13.6 09/10/2023 0420   HGB 14.5 01/11/2021 1216   HCT 38.0 (L) 09/10/2023 0420   HCT 41.6 01/11/2021 1216   PLT 149 (L) 09/10/2023 0420   PLT 179 01/11/2021 1216   MCV 92.0 09/10/2023 0420   MCV 96 01/11/2021 1216   MCH 32.9 09/10/2023 0420   MCHC 35.8 09/10/2023 0420   RDW 11.9 09/10/2023 0420   RDW 13.0 01/11/2021 1216   LYMPHSABS 0.2 (L) 07/24/2023 1945   MONOABS 0.3 07/24/2023 1945   EOSABS 0.2 07/24/2023 1945   BASOSABS 0.0 07/24/2023 1945    BMET    Component Value Date/Time   NA 138 09/10/2023 0420   NA 141 01/11/2021 1216   K 3.7 09/10/2023 0420   CL 107 09/10/2023 0420   CO2 22 09/10/2023 0420   GLUCOSE 122 (H) 09/10/2023 0420   BUN 18 09/10/2023 0420   BUN 16 01/11/2021 1216   CREATININE 1.52 (H) 09/10/2023 0420   CALCIUM 8.3 (L) 09/10/2023 0420   GFRNONAA 47 (L) 09/10/2023 0420   GFRAA >60 01/28/2019 2131    INR    Component Value Date/Time   INR 1.2 09/09/2023 1030     Intake/Output Summary (Last 24 hours) at 09/10/2023 0742 Last data filed at 09/10/2023 0422 Gross per 24 hour  Intake 1660 ml  Output 2330 ml  Net -670 ml       Assessment/Plan:  78 y.o. male is 1 day post op, s/p: TEVAR with thoracic branch    -Patient did well overnight without any issues. He has ambulated to the bathroom without issue. He is tolerating a normal diet -Left radial and bilateral groin cath sites are soft without hematoma -BLE well perfused with palpable PT pulses. Palpable left radial pulse -Moving all extremities equally. No sensory deficits. Can SLR bilaterally -Hemoglobin stable at 13.6 -Stable for d/c home today. Will arrange follow up with our office in 4 weeks with CTA. Continue ASA and statin   Loel Dubonnet, PA-C [facility] [phone] 09/10/2023 7:42 AM

## 2023-09-10 NOTE — Progress Notes (Signed)
D/c tele and IV. Went over AVS with pt and his wife. All questions were addressed.   Lawson Radar, RN

## 2023-09-10 NOTE — Plan of Care (Signed)
Problem: Clinical Measurements: Goal: Will remain free from infection Outcome: Progressing Goal: Cardiovascular complication will be avoided Outcome: Progressing

## 2023-09-11 ENCOUNTER — Emergency Department (HOSPITAL_COMMUNITY): Payer: No Typology Code available for payment source

## 2023-09-11 ENCOUNTER — Other Ambulatory Visit: Payer: Self-pay

## 2023-09-11 ENCOUNTER — Telehealth: Payer: Self-pay | Admitting: *Deleted

## 2023-09-11 ENCOUNTER — Encounter (HOSPITAL_COMMUNITY): Payer: Self-pay | Admitting: Surgery

## 2023-09-11 ENCOUNTER — Observation Stay (HOSPITAL_COMMUNITY)
Admission: EM | Admit: 2023-09-11 | Discharge: 2023-09-13 | Disposition: A | Discharge status: Home / Self Care | Payer: No Typology Code available for payment source | Attending: Surgery | Admitting: Surgery

## 2023-09-11 DIAGNOSIS — I716 Thoracoabdominal aortic aneurysm, without rupture, unspecified: Secondary | ICD-10-CM | POA: Insufficient documentation

## 2023-09-11 DIAGNOSIS — I639 Cerebral infarction, unspecified: Secondary | ICD-10-CM

## 2023-09-11 DIAGNOSIS — I2510 Atherosclerotic heart disease of native coronary artery without angina pectoris: Secondary | ICD-10-CM | POA: Diagnosis present

## 2023-09-11 DIAGNOSIS — I63532 Cerebral infarction due to unspecified occlusion or stenosis of left posterior cerebral artery: Secondary | ICD-10-CM | POA: Diagnosis not present

## 2023-09-11 DIAGNOSIS — E785 Hyperlipidemia, unspecified: Secondary | ICD-10-CM | POA: Diagnosis present

## 2023-09-11 DIAGNOSIS — R079 Chest pain, unspecified: Principal | ICD-10-CM

## 2023-09-11 DIAGNOSIS — Z79899 Other long term (current) drug therapy: Secondary | ICD-10-CM | POA: Insufficient documentation

## 2023-09-11 DIAGNOSIS — R0789 Other chest pain: Secondary | ICD-10-CM | POA: Diagnosis present

## 2023-09-11 DIAGNOSIS — I10 Essential (primary) hypertension: Secondary | ICD-10-CM | POA: Diagnosis present

## 2023-09-11 DIAGNOSIS — Z87891 Personal history of nicotine dependence: Secondary | ICD-10-CM | POA: Diagnosis not present

## 2023-09-11 DIAGNOSIS — Z7901 Long term (current) use of anticoagulants: Secondary | ICD-10-CM | POA: Diagnosis not present

## 2023-09-11 DIAGNOSIS — Z95828 Presence of other vascular implants and grafts: Secondary | ICD-10-CM

## 2023-09-11 DIAGNOSIS — G20A1 Parkinson's disease without dyskinesia, without mention of fluctuations: Secondary | ICD-10-CM | POA: Diagnosis present

## 2023-09-11 LAB — CBC WITH DIFFERENTIAL/PLATELET
Abs Immature Granulocytes: 0.03 10*3/uL (ref 0.00–0.07)
Basophils Absolute: 0.1 10*3/uL (ref 0.0–0.1)
Basophils Relative: 1 %
Eosinophils Absolute: 0.1 10*3/uL (ref 0.0–0.5)
Eosinophils Relative: 1 %
HCT: 36.9 % — ABNORMAL LOW (ref 39.0–52.0)
Hemoglobin: 13.3 g/dL (ref 13.0–17.0)
Immature Granulocytes: 0 %
Lymphocytes Relative: 13 %
Lymphs Abs: 1.2 10*3/uL (ref 0.7–4.0)
MCH: 33.4 pg (ref 26.0–34.0)
MCHC: 36 g/dL (ref 30.0–36.0)
MCV: 92.7 fL (ref 80.0–100.0)
Monocytes Absolute: 1.1 10*3/uL — ABNORMAL HIGH (ref 0.1–1.0)
Monocytes Relative: 12 %
Neutro Abs: 6.7 10*3/uL (ref 1.7–7.7)
Neutrophils Relative %: 73 %
Platelets: 110 10*3/uL — ABNORMAL LOW (ref 150–400)
RBC: 3.98 MIL/uL — ABNORMAL LOW (ref 4.22–5.81)
RDW: 12.2 % (ref 11.5–15.5)
WBC: 9.2 10*3/uL (ref 4.0–10.5)
nRBC: 0 % (ref 0.0–0.2)

## 2023-09-11 LAB — COMPREHENSIVE METABOLIC PANEL
ALT: 9 U/L (ref 0–44)
AST: 16 U/L (ref 15–41)
Albumin: 3.1 g/dL — ABNORMAL LOW (ref 3.5–5.0)
Alkaline Phosphatase: 77 U/L (ref 38–126)
Anion gap: 8 (ref 5–15)
BUN: 14 mg/dL (ref 8–23)
CO2: 24 mmol/L (ref 22–32)
Calcium: 7.9 mg/dL — ABNORMAL LOW (ref 8.9–10.3)
Chloride: 104 mmol/L (ref 98–111)
Creatinine, Ser: 1.18 mg/dL (ref 0.61–1.24)
GFR, Estimated: >60 mL/min (ref >60)
Glucose, Bld: 96 mg/dL (ref 70–99)
Potassium: 3.5 mmol/L (ref 3.5–5.1)
Sodium: 136 mmol/L (ref 135–145)
Total Bilirubin: 1.4 mg/dL — ABNORMAL HIGH (ref 0.0–1.2)
Total Protein: 5.3 g/dL — ABNORMAL LOW (ref 6.5–8.1)

## 2023-09-11 LAB — URINALYSIS, ROUTINE W REFLEX MICROSCOPIC
Bilirubin Urine: NEGATIVE
Glucose, UA: NEGATIVE mg/dL
Hgb urine dipstick: NEGATIVE
Ketones, ur: NEGATIVE mg/dL
Leukocytes,Ua: NEGATIVE
Nitrite: NEGATIVE
Protein, ur: NEGATIVE mg/dL
Specific Gravity, Urine: >1.046 — ABNORMAL HIGH (ref 1.005–1.030)
pH: 5 (ref 5.0–8.0)

## 2023-09-11 LAB — POCT ACTIVATED CLOTTING TIME
Activated Clotting Time: 199 s
Activated Clotting Time: 245 s

## 2023-09-11 LAB — TROPONIN I (HIGH SENSITIVITY)
Troponin I (High Sensitivity): 7 ng/L (ref <18)
Troponin I (High Sensitivity): 7 ng/L (ref <18)

## 2023-09-11 MED ORDER — SODIUM CHLORIDE 0.9 % IV BOLUS: 1000.0000 mL | Freq: Once | INTRAVENOUS | Status: DC

## 2023-09-11 MED ORDER — PHENOL 1.4 % MT LIQD: 1.0000 | OROMUCOSAL | Status: DC | PRN

## 2023-09-11 MED ORDER — CARBIDOPA-LEVODOPA 25-100 MG PO TABS
1.0000 | ORAL_TABLET | Freq: Two times a day (BID) | ORAL | Status: DC
  Administered 2023-09-12 – 2023-09-13 (×3): 1 via ORAL
  Filled 2023-09-11 (×4): qty 1

## 2023-09-11 MED ORDER — MORPHINE SULFATE (PF) 4 MG/ML IV SOLN
4.0000 mg | Freq: Once | INTRAVENOUS | Status: AC
  Administered 2023-09-11: 4 mg via INTRAVENOUS
  Filled 2023-09-11: qty 1

## 2023-09-11 MED ORDER — ALUM & MAG HYDROXIDE-SIMETH 200-200-20 MG/5ML PO SUSP
15.0000 mL | ORAL | Status: DC | PRN
Start: 2023-09-11 — End: 2023-09-13
  Administered 2023-09-12: 30 mL via ORAL
  Filled 2023-09-11: qty 30

## 2023-09-11 MED ORDER — ATORVASTATIN CALCIUM 80 MG PO TABS
80.0000 mg | ORAL_TABLET | Freq: Every evening | ORAL | Status: DC
  Administered 2023-09-11 – 2023-09-12 (×2): 80 mg via ORAL
  Filled 2023-09-11 (×2): qty 1

## 2023-09-11 MED ORDER — POLYETHYL GLYCOL-PROPYL GLYCOL 0.4-0.3 % OP SOLN: 1.0000 [drp] | Freq: Three times a day (TID) | OPHTHALMIC | Status: DC | PRN

## 2023-09-11 MED ORDER — CARVEDILOL 6.25 MG PO TABS
6.2500 mg | ORAL_TABLET | Freq: Two times a day (BID) | ORAL | Status: DC
  Administered 2023-09-11 – 2023-09-13 (×4): 6.25 mg via ORAL
  Filled 2023-09-11: qty 1
  Filled 2023-09-11 (×2): qty 2
  Filled 2023-09-11: qty 1

## 2023-09-11 MED ORDER — AMLODIPINE BESYLATE 5 MG PO TABS
5.0000 mg | ORAL_TABLET | Freq: Every evening | ORAL | Status: DC
  Administered 2023-09-11 – 2023-09-12 (×2): 5 mg via ORAL
  Filled 2023-09-11 (×2): qty 1

## 2023-09-11 MED ORDER — ASPIRIN 81 MG PO TBEC
81.0000 mg | DELAYED_RELEASE_TABLET | Freq: Every day | ORAL | Status: DC
  Administered 2023-09-11 – 2023-09-12 (×2): 81 mg via ORAL
  Filled 2023-09-11 (×2): qty 1

## 2023-09-11 MED ORDER — POLYETHYLENE GLYCOL 3350 17 G PO PACK
17.0000 g | PACK | Freq: Every day | ORAL | Status: DC
Start: 2023-09-12 — End: 2023-09-13
  Administered 2023-09-12 – 2023-09-13 (×2): 17 g via ORAL
  Filled 2023-09-11 (×2): qty 1

## 2023-09-11 MED ORDER — FAMOTIDINE IN NACL 20-0.9 MG/50ML-% IV SOLN
20.0000 mg | Freq: Once | INTRAVENOUS | Status: AC
  Administered 2023-09-11: 20 mg via INTRAVENOUS
  Filled 2023-09-11: qty 50

## 2023-09-11 MED ORDER — ADULT MULTIVITAMIN W/MINERALS CH
1.0000 | ORAL_TABLET | Freq: Every morning | ORAL | Status: DC
  Administered 2023-09-12: 1 via ORAL
  Filled 2023-09-11 (×2): qty 1

## 2023-09-11 MED ORDER — HYDROMORPHONE HCL 1 MG/ML IJ SOLN
0.5000 mg | INTRAMUSCULAR | Status: DC | PRN
Start: 2023-09-11 — End: 2023-09-13
  Administered 2023-09-11: 0.5 mg via INTRAVENOUS
  Administered 2023-09-12: 1 mg via INTRAVENOUS
  Filled 2023-09-11 (×2): qty 1

## 2023-09-11 MED ORDER — ONDANSETRON HCL 4 MG/2ML IJ SOLN
4.0000 mg | Freq: Once | INTRAMUSCULAR | Status: AC
  Administered 2023-09-11: 4 mg via INTRAVENOUS
  Filled 2023-09-11: qty 2

## 2023-09-11 MED ORDER — OMEGA-3-ACID ETHYL ESTERS 1 G PO CAPS
1.0000 g | ORAL_CAPSULE | Freq: Every day | ORAL | Status: DC
  Administered 2023-09-12 – 2023-09-13 (×2): 1 g via ORAL
  Filled 2023-09-11 (×3): qty 1

## 2023-09-11 MED ORDER — IOHEXOL 350 MG/ML SOLN
100.0000 mL | Freq: Once | INTRAVENOUS | Status: AC | PRN
  Administered 2023-09-11: 100 mL via INTRAVENOUS

## 2023-09-11 MED ORDER — METOPROLOL TARTRATE 5 MG/5ML IV SOLN: 2.0000 mg | INTRAVENOUS | Status: DC | PRN

## 2023-09-11 MED ORDER — HYDRALAZINE HCL 20 MG/ML IJ SOLN: 5.0000 mg | INTRAMUSCULAR | Status: DC | PRN

## 2023-09-11 MED ORDER — OMEGA-3 1000 MG PO CAPS: ORAL_CAPSULE | Freq: Every morning | ORAL | Status: DC

## 2023-09-11 MED ORDER — GUAIFENESIN-DM 100-10 MG/5ML PO SYRP: 15.0000 mL | ORAL_SOLUTION | ORAL | Status: DC | PRN

## 2023-09-11 MED ORDER — ACETAMINOPHEN 500 MG PO TABS
1000.0000 mg | ORAL_TABLET | Freq: Every day | ORAL | Status: DC | PRN
  Administered 2023-09-12: 1000 mg via ORAL
  Filled 2023-09-11: qty 2

## 2023-09-11 MED ORDER — CARBIDOPA-LEVODOPA ER 25-100 MG PO TBCR
1.0000 | EXTENDED_RELEASE_TABLET | Freq: Every day | ORAL | Status: DC
  Administered 2023-09-11 – 2023-09-12 (×2): 1 via ORAL
  Filled 2023-09-11 (×4): qty 1

## 2023-09-11 MED ORDER — SODIUM CHLORIDE 0.9 % IV SOLN: INTRAVENOUS | Status: AC

## 2023-09-11 MED ORDER — TAMSULOSIN HCL 0.4 MG PO CAPS: 0.4000 mg | ORAL_CAPSULE | Freq: Every day | ORAL | Status: DC

## 2023-09-11 MED ORDER — ZOLPIDEM TARTRATE 5 MG PO TABS: 10.0000 mg | ORAL_TABLET | Freq: Every evening | ORAL | Status: DC | PRN

## 2023-09-11 MED ORDER — ONDANSETRON HCL 4 MG/2ML IJ SOLN: 4.0000 mg | Freq: Four times a day (QID) | INTRAMUSCULAR | Status: DC | PRN

## 2023-09-11 MED ORDER — POLYVINYL ALCOHOL 1.4 % OP SOLN: 1.0000 [drp] | OPHTHALMIC | Status: DC | PRN

## 2023-09-11 MED ORDER — PANTOPRAZOLE SODIUM 40 MG PO TBEC
40.0000 mg | DELAYED_RELEASE_TABLET | Freq: Every day | ORAL | Status: DC
  Administered 2023-09-11 – 2023-09-13 (×3): 40 mg via ORAL
  Filled 2023-09-11 (×4): qty 1

## 2023-09-11 MED ORDER — CARBIDOPA-LEVODOPA 25-100 MG PO TABS: 1.0000 | ORAL_TABLET | ORAL | Status: DC

## 2023-09-11 MED ORDER — ISOSORBIDE MONONITRATE ER 30 MG PO TB24
30.0000 mg | ORAL_TABLET | Freq: Every day | ORAL | Status: DC
  Administered 2023-09-12 – 2023-09-13 (×2): 30 mg via ORAL
  Filled 2023-09-11 (×2): qty 1

## 2023-09-11 MED ORDER — NITROGLYCERIN 0.4 MG SL SUBL: 0.4000 mg | SUBLINGUAL_TABLET | SUBLINGUAL | Status: DC | PRN

## 2023-09-11 MED ORDER — HYDROMORPHONE HCL 1 MG/ML IJ SOLN
1.0000 mg | Freq: Once | INTRAMUSCULAR | Status: AC
  Administered 2023-09-11: 1 mg via INTRAVENOUS
  Filled 2023-09-11: qty 1

## 2023-09-11 MED ORDER — CARBIDOPA-LEVODOPA 25-100 MG PO TABS
2.0000 | ORAL_TABLET | Freq: Two times a day (BID) | ORAL | Status: DC
  Administered 2023-09-11 – 2023-09-13 (×4): 2 via ORAL
  Filled 2023-09-11 (×5): qty 2

## 2023-09-11 MED ORDER — PANTOPRAZOLE SODIUM 40 MG PO TBEC
40.0000 mg | DELAYED_RELEASE_TABLET | Freq: Every day | ORAL | Status: DC
Start: 2023-09-11 — End: 2023-09-11

## 2023-09-11 MED ORDER — FERROUS SULFATE 325 (65 FE) MG PO TABS
325.0000 mg | ORAL_TABLET | ORAL | Status: DC
  Administered 2023-09-12: 325 mg via ORAL
  Filled 2023-09-11 (×2): qty 1

## 2023-09-11 MED ORDER — POTASSIUM CHLORIDE CRYS ER 20 MEQ PO TBCR
20.0000 meq | EXTENDED_RELEASE_TABLET | Freq: Once | ORAL | Status: AC
  Administered 2023-09-11: 20 meq via ORAL
  Filled 2023-09-11: qty 2

## 2023-09-11 MED ORDER — LABETALOL HCL 5 MG/ML IV SOLN: 10.0000 mg | INTRAVENOUS | Status: DC | PRN

## 2023-09-11 MED ORDER — MORPHINE SULFATE (PF) 4 MG/ML IV SOLN: 4.0000 mg | Freq: Once | INTRAVENOUS | Status: DC

## 2023-09-11 NOTE — ED Provider Notes (Signed)
  Physical Exam  BP 109/69   Pulse 80   Temp 97.8 F (36.6 C)   Resp 18   Ht 6' (1.829 m)   Wt 81.6 kg   SpO2 95%   BMI 24.40 kg/m   Physical Exam  Procedures  Procedures  ED Course / MDM    Medical Decision Making Care assumed care assumed at 3 PM.  Patient is here with trouble speaking and back pain. POD # 2 s/p aortic stent placement Dissection study showed stent in place. Sign out pending MRi brain   5:54 PM MRI showed stroke. Dr. Myra Gianotti from vascular saw patient and will admit. Consulted Dr. Selina Cooley from neurology to see patient   Problems Addressed: Cerebrovascular accident (CVA), unspecified mechanism (HCC): acute illness or injury Left-sided chest pain: acute illness or injury  Amount and/or Complexity of Data Reviewed Labs: ordered. Decision-making details documented in ED Course. Radiology: ordered and independent interpretation performed. Decision-making details documented in ED Course.  Risk Prescription drug management. Decision regarding hospitalization.          Charlynne Pander, MD 09/11/23 [phone]

## 2023-09-11 NOTE — ED Triage Notes (Signed)
PT BIB [facility] with a c/o of CP. Stent placed Wed and this am began having left shoulder, left sided CP. PT took 4 baby ASA pe r protocol and became pain free. Pain started back when he arrived to the hospital.Since stent placed, PT has had periods of confusion as well.EMS recorded soft BP's and unifocal PVC's on EKG. PT was not given nitroglycerin due to soft BP

## 2023-09-11 NOTE — ED Provider Notes (Signed)
[place] EMERGENCY DEPARTMENT AT [facility] Provider Note   CSN: [id number] Arrival date & time: 09/11/23  1134     History  Chief Complaint  Patient presents with   Chest Pain    Randall Moore. is a 78 y.o. male.  78 yo M with a chief complaint of chest pain.  Started a little bit last night and then got much worse this morning.  The worst component of it was to the left lateral aspect of the chest and then he felt like that improved but now it is migrated to the center.  He just had a endovascular repair 48 hours ago.  He is have a little bit of trouble finding his words today as well.  Denies cough congestion or fever.  Denies history of MI.   Chest Pain      Home Medications Prior to Admission medications   Medication Sig Start Date End Date Taking? Authorizing Provider  acetaminophen (TYLENOL) 500 MG tablet Take 1,000 mg by mouth daily as needed for mild pain (pain score 1-3).   Yes [provider]  amLODipine (NORVASC) 5 MG tablet Take 1 tablet (5 mg total) by mouth every evening. 09/07/23  Yes Tolia, Sunit, DO  aspirin EC 81 MG tablet Take 81 mg by mouth at bedtime.   Yes [provider]  atorvastatin (LIPITOR) 80 MG tablet Take 80 mg by mouth every evening. 03/18/16  Yes [provider]  B Complex-C (SUPER B-C PO) Take 1 tablet by mouth every evening.   Yes [provider]  carbidopa-levodopa (SINEMET IR) 25-100 MG tablet Take 1-2 tablets by mouth as directed. Take 2 tablets in the morning, take 1 tablet daily at 11 am, take 1 tablet daily at 3 pm & take 2 tablets at 6 pm. 03/29/20  Yes [provider]  Carbidopa-Levodopa ER (SINEMET CR) 25-100 MG tablet controlled release Take 1 tablet by mouth at bedtime.   Yes [provider]  carvedilol (COREG) 6.25 MG tablet Take 1 tablet (6.25 mg total) by mouth 2 (two) times daily. Hold if systolic blood pressure (top number) less than 100 mmHg or pulse less than 60  bpm. 09/07/23  Yes Tolia, Sunit, DO  EPINEPHrine 0.3 mg/0.3 mL IJ SOAJ injection Inject 0.3 mg into the muscle as needed for anaphylaxis.    Yes [provider]  ferrous sulfate 325 (65 FE) MG tablet Take 325 mg by mouth every other day. At night.   Yes [provider]  isosorbide mononitrate (IMDUR) 30 MG 24 hr tablet Take 1 tablet (30 mg total) by mouth daily. 09/07/23  Yes Tolia, Sunit, DO  Multiple Vitamin (MULTIVITAMIN WITH MINERALS) TABS tablet Take 1 tablet by mouth in the morning.   Yes [provider]  nitroGLYCERIN (NITROSTAT) 0.4 MG SL tablet Place 0.4 mg under the tongue every 5 (five) minutes as needed for chest pain.  03/18/16  Yes [provider]  Omega-3 Fatty Acids (OMEGA-3 PO) Take 1 capsule by mouth in the morning.   Yes [provider]  omeprazole (PRILOSEC) 40 MG capsule Take 40 mg by mouth at bedtime.   Yes [provider]  Polyethyl Glycol-Propyl Glycol (LUBRICANT EYE DROPS) 0.4-0.3 % SOLN Place 1-2 drops into both eyes 3 (three) times daily as needed (dry/irritated eyes.).   Yes [provider]  polyethylene glycol (MIRALAX) 17 g packet Take 17 g by mouth in the morning.   Yes [provider]  tamsulosin [facility])  0.4 MG CAPS capsule Take 0.4 mg by mouth at bedtime. 08/27/22  Yes [provider]  zolpidem (AMBIEN) 10 MG tablet Take 10 mg by mouth at bedtime as needed for sleep.   Yes [provider]      Allergies    Ibuprofen, Other, Penicillins, and Tomato    Review of Systems   Review of Systems  Cardiovascular:  Positive for chest pain.    Physical Exam Updated Vital Signs BP 125/64   Pulse 67   Temp 97.9 F (36.6 C) (Oral)   Resp (!) 21   Ht 6' (1.829 m)   Wt 81.6 kg   SpO2 100%   BMI 24.40 kg/m  Physical Exam Vitals and nursing note reviewed.  Constitutional:      Appearance: He is well-developed.  HENT:     Head: Normocephalic and atraumatic.  Eyes:     Pupils:  Pupils are equal, round, and reactive to light.  Neck:     Vascular: No JVD.  Cardiovascular:     Rate and Rhythm: Normal rate and regular rhythm.     Heart sounds: No murmur heard.    No friction rub. No gallop.  Pulmonary:     Effort: No respiratory distress.     Breath sounds: No wheezing.  Abdominal:     General: There is no distension.     Tenderness: There is no abdominal tenderness. There is no guarding or rebound.  Musculoskeletal:        General: Normal range of motion.     Cervical back: Normal range of motion and neck supple.  Skin:    Coloration: Skin is not pale.     Findings: No rash.  Neurological:     Mental Status: He is alert and oriented to person, place, and time.  Psychiatric:        Behavior: Behavior normal.     ED Results / Procedures / Treatments   Labs (all labs ordered are listed, but only abnormal results are displayed) Labs Reviewed  CBC WITH DIFFERENTIAL/PLATELET - Abnormal; Notable for the following components:      Result Value   RBC 3.98 (*)    HCT 36.9 (*)    Platelets 110 (*)    Monocytes Absolute 1.1 (*)    All other components within normal limits  COMPREHENSIVE METABOLIC PANEL - Abnormal; Notable for the following components:   Calcium 7.9 (*)    Total Protein 5.3 (*)    Albumin 3.1 (*)    Total Bilirubin 1.4 (*)    All other components within normal limits  TROPONIN I (HIGH SENSITIVITY)  TROPONIN I (HIGH SENSITIVITY)    EKG EKG Interpretation Date/Time:  Friday September 11 2023 11:39:52 EST Ventricular Rate:  79 PR Interval:  148 QRS Duration:  96 QT Interval:  363 QTC Calculation: 417 R Axis:   78  Text Interpretation: Sinus rhythm No significant change since last tracing Confirmed by Melene Plan [phone]) on 09/11/2023 2:19:38 PM  Radiology DG Chest Port 1 View Result Date: 09/11/2023 CLINICAL DATA:  Chest pain EXAM: PORTABLE CHEST 1 VIEW COMPARISON:  07/24/2023 FINDINGS: Stable heart size. Interval placement of thoracic  aortic stent graft. No abnormal widening of the mediastinum. Minimal bibasilar atelectasis. Lungs appear otherwise clear. No pleural effusion or pneumothorax. IMPRESSION: No acute findings.  Minimal bibasilar atelectasis. Electronically Signed   By: Duanne Guess D.O.   On: 09/11/2023 13:44   CT Angio Chest/Abd/Pel for Dissection W and/or Wo Contrast Result  Date: 09/11/2023 CLINICAL DATA:  Acute aortic syndrome suspected, status post thoracic aortic stent endograft EXAM: CT ANGIOGRAPHY CHEST, ABDOMEN AND PELVIS TECHNIQUE: Non-contrast CT of the chest was initially obtained. Multidetector CT imaging through the chest, abdomen and pelvis was performed using the standard protocol during bolus administration of intravenous contrast. Multiplanar reconstructed images and MIPs were obtained and reviewed to evaluate the vascular anatomy. RADIATION DOSE REDUCTION: This exam was performed according to the departmental dose-optimization program which includes automated exposure control, adjustment of the mA and/or kV according to patient size and/or use of iterative reconstruction technique. CONTRAST:  OMNIPAQUE IOHEXOL 350 MG/ML SOLN COMPARISON:  07/25/2023 FINDINGS: CTA CHEST FINDINGS VASCULAR Aorta: Satisfactory opacification of the aorta. Status post interval stent endograft repair of the aortic arch and proximal descending thoracic aorta. 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 Mild mixed calcific atherosclerosis. No evidence of endoleak or acute aortic findings. Cardiovascular: No evidence of pulmonary embolism on limited non-tailored examination. Cardiomegaly. Left coronary artery calcifications no pericardial effusion. Review of the MIP images confirms the above findings. NON VASCULAR Mediastinum/Nodes: No enlarged mediastinal, hilar, or axillary lymph nodes. Thyroid gland, trachea, and esophagus demonstrate no significant findings. Lungs/Pleura: Bibasilar scarring or atelectasis. No pleural  effusion or pneumothorax. Musculoskeletal: No chest wall abnormality. No acute osseous findings. Review of the MIP images confirms the above findings. CTA ABDOMEN AND PELVIS FINDINGS VASCULAR Normal contour and caliber of the abdominal aorta. No evidence of aneurysm, dissection, or other acute aortic pathology. Standard branching pattern of the abdominal aorta with solitary bilateral renal arteries. Moderate mixed calcific atherosclerosis. Review of the MIP images confirms the above findings. NON-VASCULAR Hepatobiliary: No focal liver abnormality is seen. Status post cholecystectomy. Mild postoperative biliary ductal dilatation. Pancreas: Unremarkable. No pancreatic ductal dilatation or surrounding inflammatory changes. Spleen: Normal in size without significant abnormality. Adrenals/Urinary Tract: Adrenal glands are unremarkable. Kidneys are normal, without renal calculi, solid lesion, or hydronephrosis. Bladder is unremarkable. Stomach/Bowel: Stomach is within normal limits. Appendix appears normal. No evidence of bowel wall thickening, distention, or inflammatory changes. Moderate burden of stool throughout the colon and rectum. Lymphatic: No enlarged abdominal or pelvic lymph nodes. Reproductive: Prostatomegaly. Other: No abdominal wall hernia or abnormality. No ascites. Musculoskeletal: No acute osseous findings. IMPRESSION: 1. Status post interval stent endograft repair of the aortic arch and proximal descending thoracic aorta. 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 No acute aortic findings. 2. Normal contour and caliber of the abdominal aorta. No evidence of aneurysm, dissection, or other acute aortic pathology. 3. Cardiomegaly and coronary artery disease. 4. Prostatomegaly. Aortic Atherosclerosis (ICD10-I70.0). Electronically Signed   By: Jearld Lesch M.D.   On: 09/11/2023 12:46    Procedures Procedures    Medications Ordered in ED Medications  sodium chloride 0.9 % bolus 1,000 mL (0  mLs Intravenous Hold 09/11/23 1408)  morphine (PF) 4 MG/ML injection 4 mg (has no administration in time range)  famotidine (PEPCID) IVPB 20 mg premix (has no administration in time range)  HYDROmorphone (DILAUDID) injection 1 mg (has no administration in time range)  iohexol (OMNIPAQUE) 350 MG/ML injection 100 mL (100 mLs Intravenous Contrast Given 09/11/23 1220)  morphine (PF) 4 MG/ML injection 4 mg (4 mg Intravenous Given 09/11/23 1401)  ondansetron (ZOFRAN) injection 4 mg (4 mg Intravenous Given 09/11/23 1401)    ED Course/ Medical Decision Making/ A&P  Medical Decision Making Amount and/or Complexity of Data Reviewed Labs: ordered. Radiology: ordered.  Risk Prescription drug management.   78 yo M with a chief complaint of chest pain.  This started yesterday afternoon and the got much worse this morning.  Patient about 48 hours ago had a thoracic aorta repair.  He was made a code medical.  Urgent CT angiogram of the chest and pelvis was negative for dissection.  He also has been having some difficulty with word finding.  Will obtain a CT of the head.  Blood work.  Canceled CT as prior contrast will likely limit study.  Will obtain MRI.  Signed out to Dr. Silverio Lay, please see their note for further details of care in the ED..  The patients results and plan were reviewed and discussed.   Any x-rays performed were independently reviewed by myself.   Differential diagnosis were considered with the presenting HPI.  Medications  sodium chloride 0.9 % bolus 1,000 mL (0 mLs Intravenous Hold 09/11/23 1408)  morphine (PF) 4 MG/ML injection 4 mg (has no administration in time range)  famotidine (PEPCID) IVPB 20 mg premix (has no administration in time range)  HYDROmorphone (DILAUDID) injection 1 mg (has no administration in time range)  iohexol (OMNIPAQUE) 350 MG/ML injection 100 mL (100 mLs Intravenous Contrast Given 09/11/23 1220)  morphine (PF) 4 MG/ML  injection 4 mg (4 mg Intravenous Given 09/11/23 1401)  ondansetron (ZOFRAN) injection 4 mg (4 mg Intravenous Given 09/11/23 1401)    Vitals:   09/11/23 1315 09/11/23 1330 09/11/23 1345 09/11/23 1500  BP: 91/68 98/66 133/78 125/64  Pulse: 67 63 67   Resp: 12 15 (!) 21   Temp:      TempSrc:      SpO2: 99% 98% 100%   Weight:      Height:        Final diagnoses:  Left-sided chest pain            Final Clinical Impression(s) / ED Diagnoses Final diagnoses:  Left-sided chest pain    Rx / DC Orders ED Discharge Orders     None         Melene Plan, DO 09/11/23 1516

## 2023-09-11 NOTE — Telephone Encounter (Signed)
Pt daughter called in regards to her father. Mr. Gust  had a TEVAR with dr. Myra Gianotti 01-29/25 and is now having Left shoulder pain and chest pain 8-9 pain level out of 10. Informed daughter to call 911 to rule out heart attack. Daughter also states he had been more confused and sometimes his words don't make sense. Pt's daughter stated she will call 911.

## 2023-09-11 NOTE — ED Notes (Signed)
Help get patient into a gown on the monitor patient is resting with call bell in reach did EKG shown to er doctor

## 2023-09-11 NOTE — ED Notes (Signed)
Pt switched from ED stretcher to hospital bed. Pt ambulatory with this RN as standby assist. Pt c/o cramping CP. RR present and even. Family at the bedside.
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
## 2023-09-11 NOTE — ED Triage Notes (Signed)
Pt had thoracic aortic anuerism repaired 2 days ago. Started having L sided chest pressure that radiated to L arm.

## 2023-09-11 NOTE — ED Notes (Signed)
PT is visibly shaking due to his Parkinsons but the shaking intensifies whenever he has some chest pain

## 2023-09-12 ENCOUNTER — Observation Stay (HOSPITAL_COMMUNITY): Payer: No Typology Code available for payment source

## 2023-09-12 DIAGNOSIS — I63132 Cerebral infarction due to embolism of left carotid artery: Secondary | ICD-10-CM | POA: Diagnosis not present

## 2023-09-12 LAB — CBC
HCT: 38.9 % — ABNORMAL LOW (ref 39.0–52.0)
Hemoglobin: 13.4 g/dL (ref 13.0–17.0)
MCH: 32.9 pg (ref 26.0–34.0)
MCHC: 34.4 g/dL (ref 30.0–36.0)
MCV: 95.6 fL (ref 80.0–100.0)
Platelets: 113 10*3/uL — ABNORMAL LOW (ref 150–400)
RBC: 4.07 MIL/uL — ABNORMAL LOW (ref 4.22–5.81)
RDW: 12.6 % (ref 11.5–15.5)
WBC: 8.6 10*3/uL (ref 4.0–10.5)
nRBC: 0 % (ref 0.0–0.2)

## 2023-09-12 LAB — PROTIME-INR
INR: 1.1 (ref 0.8–1.2)
Prothrombin Time: 14.7 s (ref 11.4–15.2)

## 2023-09-12 LAB — HEMOGLOBIN A1C
Hgb A1c MFr Bld: 5.3 % (ref 4.8–5.6)
Mean Plasma Glucose: 105.41 mg/dL

## 2023-09-12 LAB — COMPREHENSIVE METABOLIC PANEL
ALT: <5 U/L (ref 0–44)
AST: 16 U/L (ref 15–41)
Albumin: 3.2 g/dL — ABNORMAL LOW (ref 3.5–5.0)
Alkaline Phosphatase: 79 U/L (ref 38–126)
Anion gap: 10 (ref 5–15)
BUN: 16 mg/dL (ref 8–23)
CO2: 21 mmol/L — ABNORMAL LOW (ref 22–32)
Calcium: 8.2 mg/dL — ABNORMAL LOW (ref 8.9–10.3)
Chloride: 105 mmol/L (ref 98–111)
Creatinine, Ser: 1.17 mg/dL (ref 0.61–1.24)
GFR, Estimated: >60 mL/min (ref >60)
Glucose, Bld: 111 mg/dL — ABNORMAL HIGH (ref 70–99)
Potassium: 4.1 mmol/L (ref 3.5–5.1)
Sodium: 136 mmol/L (ref 135–145)
Total Bilirubin: 2.4 mg/dL — ABNORMAL HIGH (ref 0.0–1.2)
Total Protein: 5.5 g/dL — ABNORMAL LOW (ref 6.5–8.1)

## 2023-09-12 MED ORDER — CLOPIDOGREL BISULFATE 75 MG PO TABS
75.0000 mg | ORAL_TABLET | Freq: Every day | ORAL | Status: DC
  Administered 2023-09-12 – 2023-09-13 (×2): 75 mg via ORAL
  Filled 2023-09-12 (×2): qty 1

## 2023-09-12 MED ORDER — IOHEXOL 350 MG/ML SOLN
75.0000 mL | Freq: Once | INTRAVENOUS | Status: AC | PRN
  Administered 2023-09-12: 75 mL via INTRAVENOUS

## 2023-09-12 MED ORDER — STROKE: EARLY STAGES OF RECOVERY BOOK
Freq: Once | Status: AC
  Filled 2023-09-12: qty 1

## 2023-09-12 MED ORDER — IOHEXOL 350 MG/ML SOLN: 75.0000 mL | Freq: Once | INTRAVENOUS | Status: DC | PRN

## 2023-09-12 MED ORDER — GABAPENTIN 100 MG PO CAPS
100.0000 mg | ORAL_CAPSULE | Freq: Three times a day (TID) | ORAL | Status: DC
  Administered 2023-09-12 – 2023-09-13 (×4): 100 mg via ORAL
  Filled 2023-09-12 (×4): qty 1

## 2023-09-12 NOTE — Evaluation (Addendum)
Physical Therapy Evaluation and Discharge Patient Details Name: Randall Moore. MRN: [id number] DOB: 10/09/1945 Today's Date: 09/12/2023  History of Present Illness  Pt is a 78 y.o. M who presents 09/12/2023 with speech difficulty and chest pain. MRI showing left thalamic stroke along with small punctuate left posterior temporal lobe stroke. Significant PMH: Parkinson's, thoracic aortic ulcer s/c repair on 09/09/2023, HLD, HTN.  Clinical Impression  Pt admitted with above. Presents with word finding difficulties and chest pain. Pt with intermittent chest pain at rest and with activity; VSS throughout evaluation and RN and MD aware (pt also premedicated). Pt appears to be close to his functional baseline. Pt ambulating household distances with no assistive device without physical assist. Bilateral strength equal and pt denies sensory deficits. Pt set up with HHPT following recent procedure. No further acute PT needs. Thank you for this consult.       If plan is discharge home, recommend the following: Assistance with cooking/housework;Help with stairs or ramp for entrance   Can travel by private vehicle        Equipment Recommendations None recommended by PT  Recommendations for Other Services       Functional Status Assessment Patient has had a recent decline in their functional status and demonstrates the ability to make significant improvements in function in a reasonable and predictable amount of time.     Precautions / Restrictions Precautions Precautions: Fall Restrictions Weight Bearing Restrictions Per Provider Order: No      Mobility  Bed Mobility Overal bed mobility: Modified Independent                  Transfers Overall transfer level: Independent Equipment used: None                    Ambulation/Gait Ambulation/Gait assistance: Supervision Gait Distance (Feet): 200 Feet Assistive device: None Gait Pattern/deviations: Step-through pattern,  Decreased stride length, Shuffle Gait velocity: decreased     General Gait Details: Mildly shuffling gait with decreased bilateral foot clearance, supervision for safety, no overt LOB  Stairs            Wheelchair Mobility     Tilt Bed    Modified Rankin (Stroke Patients Only) Modified Rankin (Stroke Patients Only) Pre-Morbid Rankin Score: No significant disability Modified Rankin: No significant disability     Balance Overall balance assessment: Mild deficits observed, not formally tested                                           Pertinent Vitals/Pain Pain Assessment Pain Assessment: 0-10 Pain Score: 4  Pain Location: chest (RN/MD) aware Pain Descriptors / Indicators: Spasm Pain Intervention(s): Limited activity within patient's tolerance, Monitored during session, Premedicated before session    Home Living Family/patient expects to be discharged to:: Private residence Living Arrangements: Spouse/significant other;Children;Other (Comment) (6 grandchildren) Available Help at Discharge: Family Type of Home: House Home Access: Stairs to enter   [employer] of Steps: 1   Home Layout: Able to live on main level with bedroom/bathroom Home Equipment: Agricultural consultant (2 wheels);Cane - single point;Wheelchair - manual      Prior Function Prior Level of Function : Independent/Modified Independent                     Extremity/Trunk Assessment   Upper Extremity Assessment Upper Extremity Assessment: Defer to  OT evaluation    Lower Extremity Assessment Lower Extremity Assessment: RLE deficits/detail;LLE deficits/detail RLE Deficits / Details: Strength 5/5 LLE Deficits / Details: Strength 5/5    Cervical / Trunk Assessment Cervical / Trunk Assessment: Normal  Communication   Communication Communication: Other (comment) (word finding difficulty)  Cognition Arousal: Alert Behavior During Therapy: WFL for tasks  assessed/performed Overall Cognitive Status: Within Functional Limits for tasks assessed                                          General Comments      Exercises     Assessment/Plan    PT Assessment Patient does not need any further PT services  PT Problem List Decreased balance;Pain       PT Treatment Interventions      PT Goals (Current goals can be found in the Care Plan section)  Acute Rehab PT Goals Patient Stated Goal: go home PT Goal Formulation: All assessment and education complete, DC therapy    Frequency       Co-evaluation               AM-PAC PT "6 Clicks" Mobility  Outcome Measure Help needed turning from your back to your side while in a flat bed without using bedrails?: None Help needed moving from lying on your back to sitting on the side of a flat bed without using bedrails?: None Help needed moving to and from a bed to a chair (including a wheelchair)?: None Help needed standing up from a chair using your arms (e.g., wheelchair or bedside chair)?: None Help needed to walk in hospital room?: A Little Help needed climbing 3-5 steps with a railing? : A Little 6 Click Score: 22    End of Session Equipment Utilized During Treatment: Gait belt Activity Tolerance: Patient tolerated treatment well Patient left: in bed;with call bell/phone within reach Nurse Communication: Mobility status PT Visit Diagnosis: Unsteadiness on feet (R26.81)    Time: 5784-6962 PT Time Calculation (min) (ACUTE ONLY): 25 min   Charges:   PT Evaluation $PT Eval Low Complexity: 1 Low PT Treatments $Therapeutic Activity: 8-22 mins PT General Charges $$ ACUTE PT VISIT: 1 Visit         Lillia Pauls, PT, DPT Acute Rehabilitation Services Office [phone]   Norval Morton 09/12/2023, 3:47 PM

## 2023-09-12 NOTE — Progress Notes (Signed)
  Progress Note    09/12/2023 10:05 AM * No surgery found *  Subjective:  feeling better today  Vitals:   09/12/23 0800 09/12/23 0954  BP: 137/73   Pulse: 75   Resp: 18   Temp:  99.4 F (37.4 C)  SpO2: 97%     Physical Exam: Aaox3 Moving all extremities without limitation  CBC    Component Value Date/Time   WBC 8.6 09/12/2023 0240   RBC 4.07 (L) 09/12/2023 0240   HGB 13.4 09/12/2023 0240   HGB 14.5 01/11/2021 1216   HCT 38.9 (L) 09/12/2023 0240   HCT 41.6 01/11/2021 1216   PLT 113 (L) 09/12/2023 0240   PLT 179 01/11/2021 1216   MCV 95.6 09/12/2023 0240   MCV 96 01/11/2021 1216   MCH 32.9 09/12/2023 0240   MCHC 34.4 09/12/2023 0240   RDW 12.6 09/12/2023 0240   RDW 13.0 01/11/2021 1216   LYMPHSABS 1.2 09/11/2023 1245   MONOABS 1.1 (H) 09/11/2023 1245   EOSABS 0.1 09/11/2023 1245   BASOSABS 0.1 09/11/2023 1245    BMET    Component Value Date/Time   NA 136 09/12/2023 0240   NA 141 01/11/2021 1216   K 4.1 09/12/2023 0240   CL 105 09/12/2023 0240   CO2 21 (L) 09/12/2023 0240   GLUCOSE 111 (H) 09/12/2023 0240   BUN 16 09/12/2023 0240   BUN 16 01/11/2021 1216   CREATININE 1.17 09/12/2023 0240   CALCIUM 8.2 (L) 09/12/2023 0240   GFRNONAA >60 09/12/2023 0240   GFRAA >60 01/28/2019 2131    INR    Component Value Date/Time   INR 1.1 09/12/2023 0240     Intake/Output Summary (Last 24 hours) at 09/12/2023 1005 Last data filed at 09/12/2023 0246 Gross per 24 hour  Intake --  Output 250 ml  Net -250 ml     Assessment:  78 y.o. male is s/p TBE complicated by cva  Plan: Continue pain control Appreciate neuro input   Phylliss Strege C. Randie Heinz, MD [facility] [place] Office: [phone] Pager: [phone]  09/12/2023 10:05 AM

## 2023-09-12 NOTE — Progress Notes (Addendum)
STROKE TEAM PROGRESS NOTE   BRIEF HPI [name] is a 78 y.o. male with PMH significant for GERD, HTN, HLD, Parkinson's, s/p thoracic aortic ulcer repair who presented with speech difficulty and chest pain since procedure. MRI showed acute left medial thalamic stroke with additional punctate left posterior temporal lobe white matter infarct, though to be peri-procedural in etiology.   NIH on Admission: 2  INTERIM HISTORY/SUBJECTIVE Patient's wife and daughter at bedside.  Plan of care and assessment thoroughly discussed with patient and family.  On exam, no focal weakness, confusion, sensory deficits seen on exam. Some intermittent aphasia seen in identifying objects and during discussion.  Patient does have right hand tremor at baseline.    Patient does complain of sharp shooting  neuropathic type pain in chest, states it has gotten better since it started yesterday morning.  Will start gabapentin to help treat this neuropathic pain.   OBJECTIVE  CBC    Component Value Date/Time   WBC 8.6 09/12/2023 0240   RBC 4.07 (L) 09/12/2023 0240   HGB 13.4 09/12/2023 0240   HGB 14.5 01/11/2021 1216   HCT 38.9 (L) 09/12/2023 0240   HCT 41.6 01/11/2021 1216   PLT 113 (L) 09/12/2023 0240   PLT 179 01/11/2021 1216   MCV 95.6 09/12/2023 0240   MCV 96 01/11/2021 1216   MCH 32.9 09/12/2023 0240   MCHC 34.4 09/12/2023 0240   RDW 12.6 09/12/2023 0240   RDW 13.0 01/11/2021 1216   LYMPHSABS 1.2 09/11/2023 1245   MONOABS 1.1 (H) 09/11/2023 1245   EOSABS 0.1 09/11/2023 1245   BASOSABS 0.1 09/11/2023 1245    BMET    Component Value Date/Time   NA 136 09/12/2023 0240   NA 141 01/11/2021 1216   K 4.1 09/12/2023 0240   CL 105 09/12/2023 0240   CO2 21 (L) 09/12/2023 0240   GLUCOSE 111 (H) 09/12/2023 0240   BUN 16 09/12/2023 0240   BUN 16 01/11/2021 1216   CREATININE 1.17 09/12/2023 0240   CALCIUM 8.2 (L) 09/12/2023 0240   EGFR 58.0 07/20/2023 1442   EGFR 66 01/11/2021 1216    GFRNONAA >60 09/12/2023 0240    IMAGING past 24 hours CT ANGIO HEAD NECK W WO CM Result Date: 09/12/2023 CLINICAL DATA:  Stroke workup EXAM: CT ANGIOGRAPHY HEAD AND NECK WITH AND WITHOUT CONTRAST TECHNIQUE: Multidetector CT imaging of the head and neck was performed using the standard protocol during bolus administration of intravenous contrast. Multiplanar CT image reconstructions and MIPs were obtained to evaluate the vascular anatomy. Carotid stenosis measurements (when applicable) are obtained utilizing NASCET criteria, using the distal internal carotid diameter as the denominator. RADIATION DOSE REDUCTION: This exam was performed according to the departmental dose-optimization program which includes automated exposure control, adjustment of the mA and/or kV according to patient size and/or use of iterative reconstruction technique. CONTRAST:  75mL OMNIPAQUE IOHEXOL 350 MG/ML SOLN COMPARISON:  Brain MRI from yesterday FINDINGS: CT HEAD FINDINGS Brain: Known acute infarct in the anterior left thalamus. No evidence of progression or hemorrhage. No new site of infarction seen. Vascular: See below Skull: Unremarkable Sinuses/Orbits: Unremarkable Review of the MIP images confirms the above findings CTA NECK FINDINGS Aortic arch: Minimal coverage shows a stent graft. Right carotid system: Vessels are smoothly contoured and diffusely patent. No significant atheromatous change for age Left carotid system: Limited atheromatous change. The common carotid origin is not covered. No stenosis or ulceration seen. Vertebral arteries: Left proximal subclavian stent associated with the  aortic stent graft, without stenosis seen. The left vertebral artery is dominant. Both vertebral arteries are smoothly contoured and widely patent. Skeleton: No acute finding Other neck: No acute finding Upper chest: Clear apical lungs Review of the MIP images confirms the above findings CTA HEAD FINDINGS Anterior circulation: Mild atheromatous  calcification of the cavernous carotids. No branch occlusion, beading, or aneurysm Posterior circulation: The left vertebral artery is dominant. Fetal type right PCA. No branch occlusion, beading, or aneurysm. Unremarkable appearance Venous sinuses: Unremarkable Anatomic variants: As above Review of the MIP images confirms the above findings IMPRESSION: 1. No emergent vascular finding. No irregularity or significant stenosis seen underlying the patient's thalamic infarct. 2. Unremarkable appearance of left subclavian artery stent. 3. Less than typical atheromatous change for age. Electronically Signed   By: Tiburcio Pea M.D.   On: 09/12/2023 09:41   MR BRAIN WO CONTRAST Result Date: 09/11/2023 CLINICAL DATA:  Stroke suspected EXAM: MRI HEAD WITHOUT CONTRAST TECHNIQUE: Multiplanar, multiecho pulse sequences of the brain and surrounding structures were obtained without intravenous contrast. COMPARISON:  None Available. FINDINGS: Brain: Restricted diffusion with ADC correlate in the medial left thalamus (series 3, images 26-29), consistent with acute infarct. Additional punctate focus of increased signal on diffusion-weighted imaging without ADC correlate in the left posterior temporal lobe (series 3, image 20), which may represent a subacute infarct. No acute hemorrhage, mass, mass effect, or midline shift. No hydrocephalus or extra-axial collection. Pituitary and craniocervical junction within normal limits. No hemosiderin deposition to suggest remote hemorrhage. Remote lacunar infarct in the posterior right lentiform nucleus. Normal cerebral volume for age. Vascular: Normal arterial flow voids. Skull and upper cervical spine: Normal marrow signal. Sinuses/Orbits: Clear paranasal sinuses. No acute finding in the orbits. Status post bilateral lens replacements. Other: The mastoid air cells are well aerated. IMPRESSION: 1. 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 2. Additional punctate focus of increased signal  on diffusion-weighted imaging without ADC correlate in the left posterior temporal lobe, which may represent a subacute infarct. These results were called by telephone at the time of interpretation on 09/11/2023 at 5:21 pm to provider YAO, who verbally acknowledged these results. Electronically Signed   By: Wiliam Ke M.D.   On: 09/11/2023 17:21    Vitals:   09/12/23 0954 09/12/23 1100 09/12/23 1300 09/12/23 1357  BP:  116/69 117/71   Pulse:  82 86   Resp:  16 (!) 25   Temp: 99.4 F (37.4 C)   98.5 F (36.9 C)  TempSrc: Oral   Oral  SpO2:  95% 97%   Weight:      Height:         PHYSICAL EXAM General:  Alert, well-nourished, well-developed patient in no acute distress Psych:  Mood and affect appropriate for situation CV: Regular rate and rhythm on monitor Respiratory:  Regular, unlabored respirations on room air GI: Abdomen soft and nontender   NEURO:  Mental Status: Orientated to place, people but not to time or age Speech/Language: No dysarthria present.  Intermittent expressive aphasia seen in trying to identify objects and during discussions.  Seems to increase with further conversation  Cranial Nerves:  II: PERRL. Visual fields full.  III, IV, VI: EOMI. Eyelids elevate symmetrically.  V: Sensation is intact to light touch and symmetrical to face.  VII: Face is symmetrical resting and smiling VIII: hearing intact to voice. IX, X: Palate elevates symmetrically. Phonation is normal.  EP:PIRJJOAC shrug 5/5. XII: tongue is midline without fasciculations. Motor: 5/5 strength  to all muscle groups tested.  Tone: is normal and bulk is normal Sensation- Intact to light touch bilaterally. Extinction absent to light touch to DSS.   Coordination: FTN intact bilaterally, HKS: no ataxia in BLE.No drift.  Right hand tremor at baseline Gait- deferred  Most Recent NIH:1    ASSESSMENT/PLAN  Acute Ischemic Infarct:  left thalamic/IC genu infarct and left temporal occipital  punctate infarct, etiology likely peri-procedural  MRI   Acute infarct medial left thalamus Small punctate left posterior temporal lobe CTA head & neck  No LVO Unremarkable appearance of left subclavian artery stent 2D Echo 1/22: LVEF 55 to 60%, grade 1 diastolic dysfunction, mild MVR, aortic dilatation noted, dilated IVC, No shunt LDL pending HgbA1c 5.3 VTE prophylaxis - SCDs aspirin 81 mg daily prior to admission, now on aspirin 81 mg daily and clopidogrel 75 mg daily for 3 monts due to recent subclavian artery stent and then aspirin alone. Therapy recommendations: Home health PT Disposition:  pending  Chest pain, likely neuropathic pain Patient stated that right after procedure, he had left lateral chest pain, but yesterday had post mediastinum sharp pain, constant and severe.  Today more of intermittent shooting pain in the middle chest Will consider with neuropathic pain Put on Neurontin 100 mg 3 times daily.  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 Myra Gianotti  CTA: Unremarkable appearance of left subclavian artery stent Discussed DAPT with [facility], recommend DAPT for 3 months.   Hypertension Home meds:  amloidipine 5mg , coreg 6.25mg  BID, imdur 30mg  Stable BP goal normotensive.  Avoid hypotension.   Hyperlipidemia Home meds:  lipitor 80mg  LDL pending, goal < 70 Resume Lipitor 80 Continue statin on discharge  Other Stroke Risk Factors Advanced age Coronary artery disease Former smoker  Other Active Problems Parkinson's Continue home medications  Hospital day # 0   Pt seen by Neuro NP/APP and later by MD. Note/plan to be edited by MD as needed.    Lynnae January, DNP, AGACNP-BC [facility] Please use AMION for contact information & EPIC for messaging.   ATTENDING NOTE: I reviewed above note and agree with the assessment and plan. Pt was seen and examined.   Wife and daughter at bedside.  Patient reclining in bed,  stated that his chest pain improved from yesterday constant chest pain to now intermittent shooting pain in the middle chest, more consistent with neuropathic pain at this time.  Will start Neurontin 100 mg 3 times daily.  Patient stroke more like embolic related to recent procedure.  Patient still not fully orientated, had difficulty with naming.  PT and OT recommend home health.  Discussed with [facility], put on DAPT for 3 months and then aspirin alone.  Continue statin.  Pending LDL.  Will follow.  For detailed assessment and plan, please refer to above/below as I have made changes wherever appropriate.   Marvel Plan, MD PhD Stroke Neurology 09/12/2023 6:12 PM  I discussed with Dr. Randie Heinz [facility]. I spent 30 minutes additional inpatient face-to-face time with the patient, more than 50% of which was spent in counseling and coordination of care, reviewing test results, images and medication, and discussing the diagnosis, treatment plan and potential prognosis. This patient's care requiresreview of multiple databases, neurological assessment, discussion with family, other specialists and medical decision making of high complexity.       To contact Stroke Continuity provider, please refer to [email or web address]. After hours, contact General Neurology

## 2023-09-12 NOTE — ED Notes (Signed)
Notified attending physician that pt passed swallow screen and requested diet order.

## 2023-09-12 NOTE — ED Notes (Signed)
Main lab contacted to possibly add on A1C to existing specimen collected for CBC. Lab to call back if they encounter any issues processing A1C.
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
## 2023-09-12 NOTE — Plan of Care (Signed)
   Problem: Education: Goal: Knowledge of disease or condition will improve Outcome: Progressing   Problem: Coping: Goal: Will verbalize positive feelings about self Outcome: Progressing

## 2023-09-12 NOTE — ED Notes (Signed)
Called [facility] at [phone] to verify pt is being monitored.

## 2023-09-13 DIAGNOSIS — I639 Cerebral infarction, unspecified: Secondary | ICD-10-CM | POA: Diagnosis not present

## 2023-09-13 DIAGNOSIS — I716 Thoracoabdominal aortic aneurysm, without rupture, unspecified: Secondary | ICD-10-CM | POA: Diagnosis not present

## 2023-09-13 DIAGNOSIS — R079 Chest pain, unspecified: Secondary | ICD-10-CM | POA: Diagnosis not present

## 2023-09-13 DIAGNOSIS — I6340 Cerebral infarction due to embolism of unspecified cerebral artery: Secondary | ICD-10-CM

## 2023-09-13 LAB — LIPID PANEL
Cholesterol: 91 mg/dL (ref 0–200)
HDL: 31 mg/dL — ABNORMAL LOW (ref >40)
LDL Cholesterol: 47 mg/dL (ref 0–99)
Total CHOL/HDL Ratio: 2.9 {ratio}
Triglycerides: 67 mg/dL (ref <150)
VLDL: 13 mg/dL (ref 0–40)

## 2023-09-13 MED ORDER — CLOPIDOGREL BISULFATE 75 MG PO TABS: 75.0000 mg | ORAL_TABLET | Freq: Every day | ORAL | 2 refills | Status: DC

## 2023-09-13 MED ORDER — GABAPENTIN 100 MG PO CAPS: 100.0000 mg | ORAL_CAPSULE | Freq: Three times a day (TID) | ORAL | 0 refills | Status: DC

## 2023-09-13 NOTE — Discharge Summary (Incomplete)
Discharge Summary  Patient ID: Randall Moore [id number] 78 y.o. 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 There are no complicating features surrounding his repair. In addition, his cardiac workup has been negative. At this point I suspect that his chest pain is related to the positioning of the stent graft and should resolve with time. We will admit him for pain control. On MRI noted to have acute left thalamic genu infarct and left temporal occipital punctate infarct  Hospital Course: Mr. Foushee was admitted on 09/11/23 with ***  Consults: neurology  Treatments: {[id number]}  Disposition: Discharge disposition: 01-Home or Self Care       Patient Instructions:  Allergies as of 09/13/2023       Reactions   Ibuprofen Hives, Swelling   tongue and lips swell   Other Itching   Pollen /Sneezing    Penicillins Hives, Swelling   tongue and lips swell, Has patient had a PCN reaction causing immediate rash, facial/tongue/throat swelling, SOB or lightheadedness with hypotension: Yes Has patient had a PCN reaction causing severe rash involving mucus membranes or skin necrosis: No Has patient had  a PCN reaction that required hospitalization No Has patient had a PCN reaction occurring within the last 10 years: No If all of the above answers are "NO", then may proceed with Cephalosporin use.   Tomato Hives        Medication List     TAKE these medications    acetaminophen 500 MG tablet Commonly known as: TYLENOL Take 1,000 mg by mouth daily as needed for mild pain (pain score 1-3).   amLODipine 5 MG tablet Commonly known as: NORVASC Take 1 tablet (5 mg total) by mouth every evening.   aspirin EC 81 MG tablet Take 81 mg by mouth at bedtime.   atorvastatin 80 MG tablet Commonly known as: LIPITOR Take 80 mg by mouth every evening.   Carbidopa-Levodopa ER 25-100 MG tablet controlled release Commonly known as: SINEMET CR Take 1 tablet by mouth at bedtime.   carbidopa-levodopa 25-100 MG tablet Commonly known as: SINEMET IR Take 1-2 tablets by mouth as directed. Take 2 tablets in the morning, take 1 tablet daily at 11 am, take 1 tablet daily at 3 pm & take 2 tablets at 6 pm.   carvedilol 6.25 MG tablet Commonly known as: COREG Take 1 tablet (6.25 mg total) by mouth 2 (two) times daily. Hold if systolic blood pressure (top number) less than 100 mmHg or pulse less than 60 bpm.   clopidogrel 75 MG tablet Commonly known as: PLAVIX Take 1 tablet (75 mg total) by mouth daily. Start taking on: September 14, 2023   EPINEPHrine 0.3 mg/0.3 mL Soaj injection Commonly known as: EPI-PEN Inject 0.3 mg into the muscle as needed for anaphylaxis.   ferrous sulfate 325 (65 FE) MG tablet Take 325 mg by  mouth every other day. At night.   gabapentin 100 MG capsule Commonly known as: NEURONTIN Take 1 capsule (100 mg total) by mouth 3 (three) times daily for 7 days.   isosorbide mononitrate 30 MG 24 hr tablet Commonly known as: IMDUR Take 1 tablet (30 mg total) by mouth daily.   Lubricant Eye Drops 0.4-0.3 % Soln Generic drug: Polyethyl Glycol-Propyl Glycol Place 1-2 drops into  both eyes 3 (three) times daily as needed (dry/irritated eyes.).   MiraLax 17 g packet Generic drug: polyethylene glycol Take 17 g by mouth in the morning.   multivitamin with minerals Tabs tablet Take 1 tablet by mouth in the morning.   nitroGLYCERIN 0.4 MG SL tablet Commonly known as: NITROSTAT Place 0.4 mg under the tongue every 5 (five) minutes as needed for chest pain.   OMEGA-3 PO Take 1 capsule by mouth in the morning.   omeprazole 40 MG capsule Commonly known as: PRILOSEC Take 40 mg by mouth at bedtime.   SUPER B-C PO Take 1 tablet by mouth every evening.   tamsulosin 0.4 MG Caps capsule Commonly known as: FLOMAX Take 0.4 mg by mouth at bedtime.   zolpidem 10 MG tablet Commonly known as: AMBIEN Take 10 mg by mouth at bedtime as needed for sleep.       Activity: activity as tolerated and no heavy lifting for 4 weeks Diet: low fat, low cholesterol diet Wound Care:  Okay to shower  Follow-up with Dr. Myra Gianotti in 4 weeks.  SignedGraceann Congress, PA-C 09/13/2023 4:22 PM [facility] Office: [phone]

## 2023-09-13 NOTE — Progress Notes (Addendum)
STROKE TEAM PROGRESS NOTE   INTERIM HISTORY/SUBJECTIVE Patient was seen in the hallway walking with PT and OT. He is doing well. The CP largely resolved but occasionally shocking pain and lasting short. Will continue neurontin for 5-7 days and then stop. LDL 47, continue DAPT and statin.   OBJECTIVE  CBC    Component Value Date/Time   WBC 8.6 09/12/2023 0240   RBC 4.07 (L) 09/12/2023 0240   HGB 13.4 09/12/2023 0240   HGB 14.5 01/11/2021 1216   HCT 38.9 (L) 09/12/2023 0240   HCT 41.6 01/11/2021 1216   PLT 113 (L) 09/12/2023 0240   PLT 179 01/11/2021 1216   MCV 95.6 09/12/2023 0240   MCV 96 01/11/2021 1216   MCH 32.9 09/12/2023 0240   MCHC 34.4 09/12/2023 0240   RDW 12.6 09/12/2023 0240   RDW 13.0 01/11/2021 1216   LYMPHSABS 1.2 09/11/2023 1245   MONOABS 1.1 (H) 09/11/2023 1245   EOSABS 0.1 09/11/2023 1245   BASOSABS 0.1 09/11/2023 1245    BMET    Component Value Date/Time   NA 136 09/12/2023 0240   NA 141 01/11/2021 1216   K 4.1 09/12/2023 0240   CL 105 09/12/2023 0240   CO2 21 (L) 09/12/2023 0240   GLUCOSE 111 (H) 09/12/2023 0240   BUN 16 09/12/2023 0240   BUN 16 01/11/2021 1216   CREATININE 1.17 09/12/2023 0240   CALCIUM 8.2 (L) 09/12/2023 0240   EGFR 58.0 07/20/2023 1442   EGFR 66 01/11/2021 1216   GFRNONAA >60 09/12/2023 0240    IMAGING past 24 hours No results found.   Vitals:   09/12/23 2001 09/12/23 2323 09/13/23 0350 09/13/23 0731  BP: 113/61 122/65 124/81 133/80  Pulse: 84 88 83   Resp: 16 16 16 18   Temp: 98.8 F (37.1 C) 98.4 F (36.9 C) 98.1 F (36.7 C) 99 F (37.2 C)  TempSrc: Oral Oral Oral Oral  SpO2: 95% 95% 94%   Weight:      Height:         PHYSICAL EXAM General:  Alert, well-nourished, well-developed patient in no acute distress Psych:  Mood and affect appropriate for situation CV: Regular rate and rhythm on monitor Respiratory:  Regular, unlabored respirations on room air GI: Abdomen soft and nontender Neuro - awake, alert,  eyes open, orientated to age, place, time and people. No aphasia, fluent language, following all simple commands. Able to name and repeat and read. No gaze palsy, tracking bilaterally, visual field full, PERRL. No facial droop. Tongue midline. Bilateral UEs 5/5, no drift. Bilaterally LEs 5/5, no drift. Sensation symmetrical bilaterally, b/l FTN intact but with right hand resting tremor, gait not tested.     ASSESSMENT/PLAN [name] is a 78 y.o. male with PMH significant for GERD, HTN, HLD, Parkinson's, s/p thoracic aortic ulcer repair who presented with speech difficulty and chest pain since procedure. MRI showed acute left medial thalamic stroke with additional punctate left posterior temporal lobe white matter infarct, though to be peri-procedural in etiology.   Acute Ischemic Infarct:  left thalamic/IC genu infarct and left temporal occipital punctate infarct, etiology likely peri-procedural  MRI   Acute infarct medial left thalamus Small punctate left posterior temporal lobe CTA head & neck  No LVO Unremarkable appearance of left subclavian artery stent 2D Echo 1/22: LVEF 55 to 60%, grade 1 diastolic dysfunction, mild MVR, aortic dilatation noted, dilated IVC, No shunt LDL 47 HgbA1c 5.3 VTE prophylaxis - SCDs aspirin 81 mg daily prior  to admission, now on aspirin 81 mg daily and clopidogrel 75 mg daily for 3 monts due to recent subclavian artery stent and then aspirin alone. Therapy recommendations: Home health PT Disposition:  pending  Chest pain, likely neuropathic pain, improved Patient stated that right after procedure, he had left lateral chest pain, but yesterday had post mediastinum sharp pain, constant and severe.  Today more of intermittent shooting pain in the middle chest Will consider with neuropathic pain Put on Neurontin 100 mg 3 times daily, continue for 5-7 days and then stop if symptoms resolve.  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 Myra Gianotti  CTA: Unremarkable appearance of left subclavian artery stent Discussed DAPT with [facility], recommend DAPT for 3 months.   Hypertension Home meds:  amloidipine 5mg , coreg 6.25mg  BID, imdur 30mg  Stable BP goal normotensive.  Avoid hypotension.   Hyperlipidemia Home meds:  lipitor 80mg  LDL 47, goal < 70 Resumed Lipitor 80 Continue statin on discharge  Other Stroke Risk Factors Advanced age Coronary artery disease Former smoker  Other Active Problems Parkinson's Continue home medications  Hospital day # 0  Neurology will sign off. Please call with questions. Pt will follow up with stroke clinic NP at [facility] in about 4 weeks. Thanks for the consult.   Marvel Plan, MD PhD Stroke Neurology 09/13/2023 11:42 AM        To contact Stroke Continuity provider, please refer to [email or web address]. After hours, contact General Neurology

## 2023-09-13 NOTE — Progress Notes (Addendum)
  Progress Note    09/13/2023 8:35 AM * No surgery found *  Subjective:  feeling much better this morning, Feels like he is back to his baseline. Mentating well. Tolerating diet.    Vitals:   09/13/23 0350 09/13/23 0731  BP: 124/81 133/80  Pulse: 83   Resp: 16 18  Temp: 98.1 F (36.7 C) 99 F (37.2 C)  SpO2: 94%    Physical Exam: Cardiac:  regular Lungs:  non labored Extremities: bilateral femoral access sites without swelling or hematoma. moving all extremities without deficits. 2+ left radial pulse Neurologic: Alert and oriented   CBC    Component Value Date/Time   WBC 8.6 09/12/2023 0240   RBC 4.07 (L) 09/12/2023 0240   HGB 13.4 09/12/2023 0240   HGB 14.5 01/11/2021 1216   HCT 38.9 (L) 09/12/2023 0240   HCT 41.6 01/11/2021 1216   PLT 113 (L) 09/12/2023 0240   PLT 179 01/11/2021 1216   MCV 95.6 09/12/2023 0240   MCV 96 01/11/2021 1216   MCH 32.9 09/12/2023 0240   MCHC 34.4 09/12/2023 0240   RDW 12.6 09/12/2023 0240   RDW 13.0 01/11/2021 1216   LYMPHSABS 1.2 09/11/2023 1245   MONOABS 1.1 (H) 09/11/2023 1245   EOSABS 0.1 09/11/2023 1245   BASOSABS 0.1 09/11/2023 1245    BMET    Component Value Date/Time   NA 136 09/12/2023 0240   NA 141 01/11/2021 1216   K 4.1 09/12/2023 0240   CL 105 09/12/2023 0240   CO2 21 (L) 09/12/2023 0240   GLUCOSE 111 (H) 09/12/2023 0240   BUN 16 09/12/2023 0240   BUN 16 01/11/2021 1216   CREATININE 1.17 09/12/2023 0240   CALCIUM 8.2 (L) 09/12/2023 0240   GFRNONAA >60 09/12/2023 0240   GFRAA >60 01/28/2019 2131    INR    Component Value Date/Time   INR 1.1 09/12/2023 0240    No intake or output data in the 24 hours ending 09/13/23 9147   Assessment/Plan:  78 y.o. male s/p recent TEVAR admitted with acute infarct of left medial thalamus and punctate left posterior temporal lobe infarct   Feels much improved today back to his baseline  No new neurological deficits B femoral access sites without swelling or  hematoma Appreciate neurology assistance [facility] PT already arranged from recent surgery Continue on Aspirin, Statin, Plavix x 3 months  Okay for discharge home today once cleared by Neurology Has follow up arranged on 3/10 with Dr. Metro Kung, PA-C [facility] [phone] 09/13/2023 8:35 AM   I have interviewed and examined patient with PA and agree with assessment and plan above.  Appreciate neurology input.  Plan for discharge today on Neurontin and follow-up with Dr. Myra Gianotti as previously scheduled.  Romana Deaton C. Randie Heinz, MD [facility] [place] Office: [phone] Pager: [phone]

## 2023-09-13 NOTE — TOC Transition Note (Signed)
Transition of Care [facility]) - Discharge Note   Patient Details  Name: Randall Moore. MRN: [id number] Date of Birth: March 17, 1946  Transition of Care [facility]) CM/SW Contact:  Ronny Bacon, RN Phone Number: 09/13/2023, 1:57 PM   Clinical Narrative:   Patient is being discharged today. Heather with [facility] made aware.          Patient Goals and CMS Choice            Discharge Placement                       Discharge Plan and Services Additional resources added to the After Visit Summary for                                       Social Drivers of Health (SDOH) Interventions SDOH Screenings   Food Insecurity: Patient Declined (09/11/2023)  Housing: Patient Declined (09/11/2023)  Transportation Needs: Patient Declined (09/11/2023)  Utilities: Patient Declined (09/11/2023)  Social Connections: Unknown (09/11/2023)  Tobacco Use: Medium Risk (09/11/2023)     Readmission Risk Interventions    09/10/2023   10:20 AM  Readmission Risk Prevention Plan  Post Dischage Appt Complete  Medication Screening Complete  Transportation Screening Complete

## 2023-09-13 NOTE — Progress Notes (Signed)
Orders received to discharge patient.  Telemetry monitor removed and CCMD notified.  No PIV access present at time of discharge.  Discharge instructions, follow up, medications and instructions for their use discussed with patient and family at bedside.

## 2023-09-13 NOTE — Evaluation (Addendum)
Occupational Therapy Evaluation and Discharge Patient Details Name: Randall Moore. MRN: [id number] DOB: [birth year] Today's Date: 09/13/2023   History of Present Illness Pt is a 78 y.o. male who presents 09/12/2023 with speech difficulty and chest pain. MRI showing left thalamic stroke along with small punctuate left posterior temporal lobe stroke. Significant PMH: Parkinson's, thoracic aortic ulcer s/c repair on 09/09/2023, HLD, HTN.   Clinical Impression   At baseline, pt is Independent with ADLs, IADLs, and functional mobility without an AD. Pt now presents with decreased activity tolerance and mild balance deficits noted in dynamic standing/stepping with no overt LOB. Pt currently presents close to baseline PLOF with functional tasks with pt demonstrating ability to complete UB ADLs Independent to Supervision, LB ADLs with Mod I to Supervision, and functional mobility/transfers without an AD with Mod I to Supervision for safety with no physical assist needed. Pt with cognition largely [facility] for tasks assessed (see details below) this day. OT completed vision screen with pt noted with decreased smoothness of tracking in the Right horizontal plane/Right superior field and mild additional eye shifts during screening with pt reporting no vision changes and no functional visual deficits noted during functional tasks this session. VSS on RA throughout session. Plan for pt to discharge home later this day with HHPT services to address balance and activity tolerance. No further benefit from acute skilled OT services at this time and no post-acute OT or equipment needs anticipated at this time. OT is signing off.       If plan is discharge home, recommend the following: A little help with walking and/or transfers;A little help with bathing/dressing/bathroom;Assist for transportation;Help with stairs or ramp for entrance    Functional Status Assessment  Patient has had a recent decline in their functional  status and demonstrates the ability to make significant improvements in function in a reasonable and predictable amount of time.  Equipment Recommendations  None recommended by OT (Pt already has needed equipment)    Recommendations for Other Services       Precautions / Restrictions Precautions Precautions: Fall Restrictions Weight Bearing Restrictions Per Provider Order: No      Mobility Bed Mobility               General bed mobility comments: Pt sitting in recliner at beginning and end of session    Transfers Overall transfer level: Modified independent (to Supervision) Equipment used: None               General transfer comment: Mod I to Supervision for safety with no overt LOB noted      Balance Overall balance assessment: Mild deficits observed, not formally tested                                         ADL either performed or assessed with clinical judgement   ADL Overall ADL's : Needs assistance/impaired;Modified independent Eating/Feeding: Independent;Sitting   Grooming: Independent;Sitting;Modified independent;Supervision/safety;Standing   Upper Body Bathing: Modified independent;Sitting   Lower Body Bathing: Supervison/ safety;Sit to/from stand   Upper Body Dressing : Modified independent;Sitting   Lower Body Dressing: Modified independent;Sitting/lateral leans;Supervision/safety;Sit to/from stand   Toilet Transfer: Modified Independent;Supervision/safety;Ambulation;Regular Toilet (without an AD)   Toileting- Clothing Manipulation and Hygiene: Independent;Sitting/lateral lean;Supervision/safety;Sit to/from stand       Functional mobility during ADLs: Modified independent;Supervision/safety (without an AD) General ADL Comments: Pt presents with decreased activity  tolerance and mild balance deficits observed during dynamic standing/stepping with no overt LOB noted. Supervision recommended in dynamic standing/stepping for  increased safety but with no physical assist needed this session.     Vision Baseline Vision/History: 1 Wears glasses (readers) Ability to See in Adequate Light: 0 Adequate (with glasses) Patient Visual Report: No change from baseline Vision Assessment?: Yes Eye Alignment: Within Functional Limits Ocular Range of Motion: Within Functional Limits Alignment/Gaze Preference: Within Defined Limits Tracking/Visual Pursuits: Able to track stimulus in all quads without difficulty;Decreased smoothness of horizontal tracking;Decreased smoothness of eye movement to RIGHT superior field Saccades: Additional eye shifts occurred during testing (mild) Convergence: Within functional limits Visual Fields: No apparent deficits Additional Comments: Pt with no funcitonal visual deficits noted during ADLs and functional mobility. Pt reports no double vision, no blurriness, and no other recent changes in vision.     Perception         Praxis         Pertinent Vitals/Pain       Extremity/Trunk Assessment Upper Extremity Assessment Upper Extremity Assessment: Right hand dominant;Overall WFL for tasks assessed (strength, ROM, and coordination symmetrical R/L)   Lower Extremity Assessment Lower Extremity Assessment: Defer to PT evaluation   Cervical / Trunk Assessment Cervical / Trunk Assessment: Normal   Communication Communication Communication: No apparent difficulties   Cognition Arousal: Alert Behavior During Therapy: WFL for tasks assessed/performed Overall Cognitive Status: Within Functional Limits for tasks assessed                                 General Comments: PT cognition largely [facility] for tasks assessed. AAOx4 with pt requiring mildly increased time to state year and using year of the founding of [employer] to calculate current year. Pt pleasant throughout session and able to follwo 1, 2, and 3-step instructions consistently. No difficulty with word finding  noted this day.     General Comments  VSS on RA. Wife and daughter present throughout session.    Exercises     Shoulder Instructions      Home Living Family/patient expects to be discharged to:: Private residence Living Arrangements: Spouse/significant other;Children;Other relatives (daughter and six grandchildren) Available Help at Discharge: Family Type of Home: House Home Access: Stairs to enter Secretary/administrator of Steps: 1   Home Layout: Able to live on main level with bedroom/bathroom     Bathroom Shower/Tub: Producer, television/film/video: Standard     Home Equipment: Agricultural consultant (2 wheels);Cane - single point;Wheelchair - manual;Grab bars - toilet;Grab bars - tub/shower;Shower seat;BSC/3in1          Prior Functioning/Environment Prior Level of Function : Independent/Modified Independent;Driving             Mobility Comments: Independent without an AD ADLs Comments: Independent with ADLs, IADLs, and drives. Active in church and enjoys doing yard work. Served as a Arts development officer.        OT Problem List: Decreased activity tolerance      OT Treatment/Interventions:      OT Goals(Current goals can be found in the care plan section) Acute Rehab OT Goals Patient Stated Goal: to return home and remain active in church and with yard work OT Goal Formulation: With patient/family  OT Frequency:      Co-evaluation              AM-PAC OT "6 Clicks" Daily Activity  Outcome Measure Help from another person eating meals?: None Help from another person taking care of personal grooming?: A Little Help from another person toileting, which includes using toliet, bedpan, or urinal?: A Little Help from another person bathing (including washing, rinsing, drying)?: A Little Help from another person to put on and taking off regular upper body clothing?: None Help from another person to put on and taking off regular lower body clothing?: A Little 6 Click  Score: 20   End of Session Equipment Utilized During Treatment: Gait belt Nurse Communication: Mobility status;Other (comment) (Pt and family have questions regarding MRI.)  Activity Tolerance: Patient tolerated treatment well Patient left: in chair;with call bell/phone within reach;with family/visitor present  OT Visit Diagnosis: Unsteadiness on feet (R26.81);Other (comment) (decreased activity tolerance)                Time: 1914-7829 OT Time Calculation (min): 19 min Charges:  OT General Charges $OT Visit: 1 Visit OT Evaluation $OT Eval Low Complexity: 1 Low  Daymion Nazaire "Orson Eva., OTR/L, MA Acute Rehab [phone]   Lendon Colonel 09/13/2023, 12:43 PM

## 2023-09-14 ENCOUNTER — Telehealth: Payer: Self-pay

## 2023-09-14 NOTE — Telephone Encounter (Signed)
Caller: Patient's daughter, Westley Gambles   Concern: Pt's right sided BP readings elevated 190's/70's, taking HTN meds  Denies any headache, dizziness, or any stroke-like symptoms  She took his BP on L side and it was comparatively less  She noted that he has some irregularity to his HR, noted to have PVC's, had several runs of VTach in the hospital noted on the monitor  Treatments:  Instructed her to take his BP only on the right side d/t subclavian stent coverage on the left, which will be significantly less,   Procedure:  TEVAR  Consulted: Matt, PA  Resolution:  Instructed to call cardiology to see if his f/u appt could be moved earlier  Instructed her to take his BP in the AM and PM after rest and journal the readings. If pt exhibits any stroke-like sx's, go to ED immediately.

## 2023-09-21 ENCOUNTER — Other Ambulatory Visit: Payer: Self-pay

## 2023-09-21 ENCOUNTER — Encounter: Payer: No Typology Code available for payment source | Admitting: Surgery

## 2023-09-21 DIAGNOSIS — I7123 Aneurysm of the descending thoracic aorta, without rupture: Secondary | ICD-10-CM

## 2023-09-22 ENCOUNTER — Encounter (HOSPITAL_COMMUNITY): Payer: Self-pay | Admitting: Surgery

## 2023-09-29 ENCOUNTER — Encounter: Payer: Self-pay | Admitting: Surgery

## 2023-09-30 ENCOUNTER — Ambulatory Visit
Admission: RE | Admit: 2023-09-30 | Discharge: 2023-09-30 | Disposition: A | Discharge status: Home / Self Care | Payer: No Typology Code available for payment source | Source: Ambulatory Visit | Attending: Surgery | Admitting: Surgery

## 2023-09-30 DIAGNOSIS — I7123 Aneurysm of the descending thoracic aorta, without rupture: Secondary | ICD-10-CM

## 2023-09-30 MED ORDER — IOPAMIDOL (ISOVUE-370) INJECTION 76%
100.0000 mL | Freq: Once | INTRAVENOUS | Status: AC | PRN
  Administered 2023-09-30: 100 mL via INTRAVENOUS

## 2023-10-09 ENCOUNTER — Ambulatory Visit: Payer: Self-pay | Admitting: Cardiology

## 2023-10-14 ENCOUNTER — Ambulatory Visit: Payer: Medicare Other | Attending: Cardiology | Admitting: Cardiology

## 2023-10-14 ENCOUNTER — Encounter: Payer: Self-pay | Admitting: Cardiology

## 2023-10-14 VITALS — BP 128/77 | Ht 72.0 in | Wt 178.4 lb

## 2023-10-14 DIAGNOSIS — I340 Nonrheumatic mitral (valve) insufficiency: Secondary | ICD-10-CM

## 2023-10-14 DIAGNOSIS — Z87891 Personal history of nicotine dependence: Secondary | ICD-10-CM

## 2023-10-14 DIAGNOSIS — I951 Orthostatic hypotension: Secondary | ICD-10-CM

## 2023-10-14 DIAGNOSIS — E782 Mixed hyperlipidemia: Secondary | ICD-10-CM

## 2023-10-14 DIAGNOSIS — I2510 Atherosclerotic heart disease of native coronary artery without angina pectoris: Secondary | ICD-10-CM | POA: Diagnosis not present

## 2023-10-14 DIAGNOSIS — I719 Aortic aneurysm of unspecified site, without rupture: Secondary | ICD-10-CM

## 2023-10-14 DIAGNOSIS — I351 Nonrheumatic aortic (valve) insufficiency: Secondary | ICD-10-CM

## 2023-10-14 DIAGNOSIS — I10 Essential (primary) hypertension: Secondary | ICD-10-CM

## 2023-10-14 DIAGNOSIS — G20A1 Parkinson's disease without dyskinesia, without mention of fluctuations: Secondary | ICD-10-CM

## 2023-10-14 MED ORDER — ISOSORBIDE MONONITRATE ER 30 MG PO TB24: 30.0000 mg | ORAL_TABLET | Freq: Every morning | ORAL | 3 refills | Status: DC

## 2023-10-14 MED ORDER — AMLODIPINE BESYLATE 2.5 MG PO TABS
2.5000 mg | ORAL_TABLET | Freq: Every evening | ORAL | 3 refills | Status: DC
Start: 2023-10-14 — End: 2023-12-01

## 2023-10-14 MED ORDER — AMLODIPINE BESYLATE 2.5 MG PO TABS: 2.5000 mg | ORAL_TABLET | Freq: Every evening | ORAL | 3 refills | Status: DC

## 2023-10-14 NOTE — Progress Notes (Signed)
 Cardiology Office Note:  .   Date:  10/14/2023  ID:  Rochele Pages., DOB 06-23-1946, MRN [id number] PCP:  System, Provider Not In  Former Cardiology Providers: Dr. Eden Emms and Dr. E[facility] Providers Cardiologist:  Tessa Lerner, DO , [facility] (established care 01/11/2021) Electrophysiologist:  None  Click to update primary MD,subspecialty MD or APP then REFRESH:1}    Chief Complaint  Patient presents with   Follow-up   Coronary Artery Disease    S/p procedure    History of Present Illness: .   Randall Moore. is a 78 y.o. 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  For which she is currently on amlodipine and Imdur.  In the past he had a mechanical fall while cutting trees was hospitalized due to orbital fractures, T2/T3 fractures, abrasions, and scalp lacerations.  In the recent past he had CT of the chest which noted penetrating aortic ulcer on the underside of the mid aortic arch with surrounding intramural hemorrhage/mural thickening.  He was evaluated by cardiothoracic surgeon Dr. Laneta Simmers as well as vascular surgery.  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  Patient was discharged home and later presents to the hospital and was diagnosed with acute/subacute stroke.  No obvious residual defects with the exception of some cognitive impairment per daughter who accompanies him at today's visit.  Patient was placed on aspirin and Plavix for 3 months  then aspirin alone as per neurology ideations.  Neurology felt that his stroke was likely embolic and periprocedural.  He was also started on gabapentin for what appears to be neuropathic chest pain and since then his symptoms have resolved.  He has an appointment in stroke clinic later this month and also with vascular surgery on 10/19/2023.  Patient denies any anginal chest pain or heart failure symptoms.  However recently he has been experiencing episodes of lightheaded and dizziness-and family is concerned that he may be having a TIA.  After getting his vital signs I walk into the patient room patient started feeling lightheaded and dizzy and based on the support staff he looked pale.  He was laid flat.  I entered the room and his neurological exam was intact.  I rose his legs up thinking that he may be experiencing orthostasis.  He shortly started feeling well.  Blood pressures at rest are within normal limits.  Her orthostatic vital signs are positive.  His daughter has been checking his home blood pressures and for the last 11 days average blood pressure is 114/69 with a pulse of 100 bpm.  He is currently taking amlodipine 5 mg p.o. every afternoon, Imdur 60 mg p.o. daily, and carvedilol 6.25 mg p.o. twice daily.  Review of Systems: .   Review of Systems  Cardiovascular:  Negative for chest pain, claudication, dyspnea on exertion, irregular heartbeat, leg swelling, near-syncope, orthopnea, palpitations, paroxysmal nocturnal dyspnea and syncope.  Respiratory:  Negative for shortness of breath.   Hematologic/Lymphatic: Negative for bleeding problem.  Musculoskeletal:  Positive for falls and joint pain. Negative for muscle cramps and myalgias.  Neurological:  Positive for  dizziness, light-headedness, tremors (Resting) and vertigo.    Studies Reviewed:    Echocardiogram: 09/02/2023 LVEF 55 to 60%, grade 1 diastolic dysfunction. Right ventricular size is mildly dilated, function normal. Mild  MR Trivial AR Ascending aorta 41 mm, dilated. Estimated RAP 8 mmHg.  Left heart catheterization 02/05/2021: LV 110/1, EDP 9 mmHg.  Ao 112/59, mean 81 mmHg.  There was no pressure clinical statical. LM: Smooth and normal. LAD: Proximal LAD smooth 40% stenosis.  There is minimal disease in the midsegment.  Moderate-sized D1 and D2. Ramus: Large vessel, has secondary branches.  Minimal disease is evident. RCA: Dominant.  Very mild disease in the PL branch.   Recommendation: Patient had very similar anatomy in 2017 angiography.  No change in the coronary anatomy, previously he has had FFR/PressureWire evaluation of the proximal LAD stenosis.  Hence I do not think he needs further evaluation of this lesion by physiologic means.  Suspect the lesion is only moderate.  Suspect microvascular angina or coronary spasm to be the etiology, we will try isosorbide mononitrate 60 mg daily.  30 mill contrast utilized.   RADIOLOGY: CT Angio Chest Aorta W and/or Wo Contrast 07/25/2023 1. Penetrating atherosclerotic ulcer with small amount of surrounding intramural hemorrhage or mural thickening demonstrating slight interval increase in size since prior examination of 02/07/2022. No mediastinal hematoma. No aortic dissection or aneurysm. 2. Fluid-filled esophagus suggesting changes of gastroesophageal reflux or esophageal dysmotility. 3. Stable compression deformities of T2 and T3.   Aortic Atherosclerosis (ICD10-I70.0).  MRI Brain w/o contrast  09/11/2023 1. 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 2. Additional punctate focus of increased signal on diffusion-weighted imaging without ADC correlate in the left posterior temporal lobe, which may represent a subacute infarct.  Risk Assessment/Calculations:   NA   Labs:       Latest Ref Rng & Units 09/12/2023    2:40 AM 09/11/2023   12:45 PM 09/10/2023    4:20 AM  CBC  WBC 4.0 - 10.5 K/uL 8.6  9.2  12.3   Hemoglobin 13.0 - 17.0 g/dL 14.7  82.9   56.2   Hematocrit 39.0 - 52.0 % 38.9  36.9  38.0   Platelets 150 - 400 K/uL 113  110  149        Latest Ref Rng & Units 09/12/2023    2:40 AM 09/11/2023   12:45 PM 09/10/2023    4:20 AM  BMP  Glucose 70 - 99 mg/dL 130  96  865   BUN 8 - 23 mg/dL 16  14  18    Creatinine 0.61 - 1.24 mg/dL 7.84  6.96  2.95   Sodium 135 - 145 mmol/L 136  136  138   Potassium 3.5 - 5.1 mmol/L 4.1  3.5  3.7   Chloride 98 - 111 mmol/L 105  104  107   CO2 22 - 32 mmol/L 21  24  22    Calcium 8.9 - 10.3 mg/dL 8.2  7.9  8.3       Latest Ref Rng & Units 09/12/2023    2:40 AM 09/11/2023   12:45 PM 09/10/2023    4:20 AM  CMP  Glucose 70 - 99 mg/dL 284  96  132   BUN 8 - 23 mg/dL 16  14  18    Creatinine 0.61 - 1.24 mg/dL 4.40  1.02  7.25   Sodium 135 - 145 mmol/L 136  136  138   Potassium 3.5 - 5.1 mmol/L 4.1  3.5  3.7  Chloride 98 - 111 mmol/L 105  104  107   CO2 22 - 32 mmol/L 21  24  22    Calcium 8.9 - 10.3 mg/dL 8.2  7.9  8.3   Total Protein 6.5 - 8.1 g/dL 5.5  5.3    Total Bilirubin 0.0 - 1.2 mg/dL 2.4  1.4    Alkaline Phos 38 - 126 U/L 79  77    AST 15 - 41 U/L 16  16    ALT 0 - 44 U/L <5  9      Lab Results  Component Value Date   CHOL 91 09/13/2023   HDL 31 (L) 09/13/2023   LDLCALC 47 09/13/2023   TRIG 67 09/13/2023   CHOLHDL 2.9 09/13/2023   No results for input(s): "LIPOA" in the last 8760 hours. No components found for: "NTPROBNP" No results for input(s): "PROBNP" in the last 8760 hours. No results for input(s): "TSH" in the last 8760 hours.  Physical Exam:    Today's Vitals   10/14/23 1002  BP: 128/77  SpO2: 98%  Weight: 178 lb 6.4 oz (80.9 kg)  Height: 6' (1.829 m)   Body mass index is 24.2 kg/m. Wt Readings from Last 3 Encounters:  10/14/23 178 lb 6.4 oz (80.9 kg)  09/11/23 179 lb 14.3 oz (81.6 kg)  09/09/23 180 lb (81.6 kg)   Orthostatic VS for the past 72 hrs (Last 3 readings):  Orthostatic BP Patient Position BP Location Cuff Size Orthostatic Pulse  10/14/23 1057  97/53 Standing Right Arm Normal 104  10/14/23 1056 (!) 87/55 Sitting Right Arm Normal 78  10/14/23 1055 105/64 Supine Right Arm Normal 57    Physical Exam  Constitutional: No distress.  Age appropriate, hemodynamically stable.   Neck: No JVD present.  Cardiovascular: Normal rate, regular rhythm, S1 normal, S2 normal, intact distal pulses and normal pulses. Exam reveals no gallop, no S3 and no S4.  No murmur heard. Pulmonary/Chest: Effort normal and breath sounds normal. No stridor. He has no wheezes. He has no rales.  Abdominal: Soft. Bowel sounds are normal. He exhibits no distension. There is no abdominal tenderness.  Musculoskeletal:        General: No edema.     Cervical back: Neck supple.  Neurological: He is alert and oriented to person, place, and time. He has intact cranial nerves (2-12).  Skin: Skin is warm and moist.    Impression & Recommendation(s):  Impression:   ICD-10-CM   1. Coronary artery disease involving native coronary artery of native heart without angina pectoris  I25.10 EKG 12-Lead    isosorbide mononitrate (IMDUR) 30 MG 24 hr tablet    amLODipine (NORVASC) 2.5 MG tablet    2. Essential hypertension  I10 EKG 12-Lead    3. Orthostatic hypotension  I95.1 isosorbide mononitrate (IMDUR) 30 MG 24 hr tablet    amLODipine (NORVASC) 2.5 MG tablet    4. Mixed hyperlipidemia  E78.2     5. Nonrheumatic aortic valve insufficiency  I35.1     6. Nonrheumatic mitral valve regurgitation  I34.0     7. Penetrating atherosclerotic ulcer of aorta (HCC)  I71.9     8. Parkinson's disease, unspecified whether dyskinesia present, unspecified whether manifestations fluctuate (HCC)  G20.A1     9. Former smoker  Z87.891        Recommendation(s):  Coronary artery disease involving native coronary artery of native heart without angina pectoris Denies anginal chest pain or heart failure symptoms. His heart catheterization back in  June 2022 noted nonobstructive disease in the  LAD suspecting microvascular angina/coronary spasm. He has been on amlodipine and Imdur for the same reasons. EKG today is nonischemic. Reemphasized the importance of secondary prevention with focus on improving her modifiable cardiovascular risk factors such as glycemic control, lipid management, blood pressure control.  Essential hypertension Orthostatic hypotension Symptomatic orthostatic hypotension. Will decrease amlodipine from 5 mg p.o. every afternoon to 2.5 mg p.o. every afternoon Will also decrease his Imdur from 60 mg p.o. daily to 30 mg p.o. every afternoon Both will holding parameters to hold the past meters of mercury. Since his pulse averages 100 bpm we will continue with 6.25 of carvedilol twice a day with holding Monitor for now  Mixed hyperlipidemia Currently on atorvastatin 80 mg p.o. daily. Cardiology following peripherally, managed by primary care provider.  Nonrheumatic aortic valve insufficiency Nonrheumatic mitral valve regurgitation Most recent echocardiogram from January 2025 noted trivial AR and mild MR. Clinically asymptomatic. 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 Home and office blood pressures are well-controlled - meds changes as discussed above. Noted on a CT scan from December 2024. S/p Endovascular repair of thoracic aortic aneurysm with coverage of left subclavian artery 09/09/2023 Will defer follow up /surveillance to vascular surgery  Parkinson's disease, unspecified whether dyskinesia present, unspecified whether manifestations fluctuate (HCC) Follows with neurology at the [place].  Orders Placed:  Orders Placed This Encounter  Procedures   EKG 12-Lead   Total time spent: 52 minutes. Patient presented for an acute visit for reasons mentioned above. EKG ordered and independently reviewed. Orthostatics performed and  independently reviewed Reviewed results of the MRI of the brain without contrast 09/11/2023 Reviewed the discharge summary from 09/13/2023. Prescription management. Addressed the patient and his daughter's questions and concerns. Coordination of care.  Final Medication List:    Meds ordered this encounter  Medications   DISCONTD: amLODipine (NORVASC) 2.5 MG tablet    Sig: Take 1 tablet (2.5 mg total) by mouth every evening.    Dispense:  90 tablet    Refill:  3   DISCONTD: isosorbide mononitrate (IMDUR) 30 MG 24 hr tablet    Sig: Take 1 tablet (30 mg total) by mouth in the morning.    Dispense:  90 tablet    Refill:  3   isosorbide mononitrate (IMDUR) 30 MG 24 hr tablet    Sig: Take 1 tablet (30 mg total) by mouth in the morning. Hold for SBP less than 120    Dispense:  90 tablet    Refill:  3   amLODipine (NORVASC) 2.5 MG tablet    Sig: Take 1 tablet (2.5 mg total) by mouth every evening. Hold for SBP less than 120    Dispense:  90 tablet    Refill:  3    Medications Discontinued During This Encounter  Medication Reason   gabapentin (NEURONTIN) 100 MG capsule    amLODipine (NORVASC) 5 MG tablet Reorder   isosorbide mononitrate (IMDUR) 30 MG 24 hr tablet Reorder   amLODipine (NORVASC) 2.5 MG tablet Reorder   isosorbide mononitrate (IMDUR) 30 MG 24 hr tablet Reorder     Current Outpatient Medications:    acetaminophen (TYLENOL) 500 MG tablet, Take 1,000 mg by mouth daily as needed for mild pain (pain score 1-3)., Disp: , Rfl:    aspirin EC 81 MG tablet, Take 81 mg by mouth at bedtime., Disp: ,  Rfl:    atorvastatin (LIPITOR) 80 MG tablet, Take 80 mg by mouth every evening., Disp: , Rfl:    B Complex-C (SUPER B-C PO), Take 1 tablet by mouth every evening., Disp: , Rfl:    carbidopa-levodopa (SINEMET IR) 25-100 MG tablet, Take 1-2 tablets by mouth as directed. Take 2 tablets in the morning, take 1 tablet daily at 11 am, take 1 tablet daily at 3 pm & take 2 tablets at 6 pm., Disp: ,  Rfl:    Carbidopa-Levodopa ER (SINEMET CR) 25-100 MG tablet controlled release, Take 1 tablet by mouth at bedtime., Disp: , Rfl:    carvedilol (COREG) 6.25 MG tablet, Take 1 tablet (6.25 mg total) by mouth 2 (two) times daily. Hold if systolic blood pressure (top number) less than 100 mmHg or pulse less than 60 bpm., Disp: 180 tablet, Rfl: 3   clopidogrel (PLAVIX) 75 MG tablet, Take 1 tablet (75 mg total) by mouth daily., Disp: 30 tablet, Rfl: 2   EPINEPHrine 0.3 mg/0.3 mL IJ SOAJ injection, Inject 0.3 mg into the muscle as needed for anaphylaxis. , Disp: , Rfl:    ferrous sulfate 325 (65 FE) MG tablet, Take 325 mg by mouth every other day. At night., Disp: , Rfl:    Multiple Vitamin (MULTIVITAMIN WITH MINERALS) TABS tablet, Take 1 tablet by mouth in the morning., Disp: , Rfl:    nitroGLYCERIN (NITROSTAT) 0.4 MG SL tablet, Place 0.4 mg under the tongue every 5 (five) minutes as needed for chest pain. , Disp: , Rfl:    Omega-3 Fatty Acids (OMEGA-3 PO), Take 1 capsule by mouth in the morning., Disp: , Rfl:    omeprazole (PRILOSEC) 40 MG capsule, Take 40 mg by mouth at bedtime., Disp: , Rfl:    Polyethyl Glycol-Propyl Glycol (LUBRICANT EYE DROPS) 0.4-0.3 % SOLN, Place 1-2 drops into both eyes 3 (three) times daily as needed (dry/irritated eyes.)., Disp: , Rfl:    polyethylene glycol (MIRALAX) 17 g packet, Take 17 g by mouth in the morning., Disp: , Rfl:    tamsulosin (FLOMAX) 0.4 MG CAPS capsule, Take 0.4 mg by mouth at bedtime., Disp: , Rfl:    zolpidem (AMBIEN) 10 MG tablet, Take 10 mg by mouth at bedtime as needed for sleep., Disp: , Rfl:    amLODipine (NORVASC) 2.5 MG tablet, Take 1 tablet (2.5 mg total) by mouth every evening. Hold for SBP less than 120, Disp: 90 tablet, Rfl: 3   isosorbide mononitrate (IMDUR) 30 MG 24 hr tablet, Take 1 tablet (30 mg total) by mouth in the morning. Hold for SBP less than 120, Disp: 90 tablet, Rfl: 3  Consent:   NA  Disposition:    6 month follow up   His  questions and concerns were addressed to his satisfaction. He voices understanding of the recommendations provided during this encounter.    Signed, Tessa Lerner, DO, [facility] [place]  [facility]  [address] [place], [place] [address] 10/14/2023 11:24 AM

## 2023-10-14 NOTE — Patient Instructions (Addendum)
 Medication Instructions:  CHANGE Amlodipine from 5 mg to 2.5 mg take one tablet at night  CHANGE Imdur to 30 mg take one tablet in the morning   Hold for SBP less than 120.   *If you need a refill on your cardiac medications before your next appointment, please call your pharmacy*  Follow-Up: At [facility], you and your health needs are our priority.  As part of our continuing mission to provide you with exceptional heart care, we have created designated Provider Care Teams.  These Care Teams include your primary Cardiologist (physician) and Advanced Practice Providers (APPs -  Physician Assistants and Nurse Practitioners) who all work together to provide you with the care you need, when you need it.   Your next appointment:   6 month(s)  Provider:   Tessa Lerner, DO     Other Instructions   1st Floor: - Lobby - Registration  - Pharmacy  - Lab - Cafe  2nd Floor: - PV Lab - Diagnostic Testing (echo, CT, nuclear med)  3rd Floor: - Vacant  4th Floor: - TCTS (cardiothoracic surgery) - AFib Clinic - Structural Heart Clinic - Vascular Surgery  - Vascular Ultrasound  5th Floor: - HeartCare Cardiology (general and EP) - Clinical Pharmacy for coumadin, hypertension, lipid, weight-loss medications, and med management appointments    Valet parking services will be available as well.

## 2023-10-19 ENCOUNTER — Encounter: Payer: Self-pay | Admitting: Surgery

## 2023-10-19 ENCOUNTER — Ambulatory Visit (INDEPENDENT_AMBULATORY_CARE_PROVIDER_SITE_OTHER): Payer: No Typology Code available for payment source | Admitting: Surgery

## 2023-10-19 VITALS — BP 112/70 | HR 75 | Temp 98.2°F | Ht 72.0 in | Wt 177.7 lb

## 2023-10-19 DIAGNOSIS — I7123 Aneurysm of the descending thoracic aorta, without rupture: Secondary | ICD-10-CM

## 2023-10-19 NOTE — Progress Notes (Signed)
 Patient name: Randall Moore. MRN: [id number] DOB: 1945/08/25 Sex: male  REASON FOR VISIT:     Post op  HISTORY OF PRESENT ILLNESS:   Randall Moore. is a 78 y.o. male who on 09/09/2023 underwent thoracic aortic aneurysm repair using a Gore TBE device.  This was done for penetrating ulcer which had increased in size.  His postoperative course was complicated by a small stroke as well as some chest pain.  His chest pain is resolved.  He is suffering from mild memory issues  The patient does suffer from Parkinson's. He is a former smoker. He is on a statin for hypercholesterolemia. He is medically managed for hypertension.   CURRENT MEDICATIONS:    Current Outpatient Medications  Medication Sig Dispense Refill   acetaminophen (TYLENOL) 500 MG tablet Take 1,000 mg by mouth daily as needed for mild pain (pain score 1-3).     amLODipine (NORVASC) 2.5 MG tablet Take 1 tablet (2.5 mg total) by mouth every evening. Hold for SBP less than 120 90 tablet 3   aspirin EC 81 MG tablet Take 81 mg by mouth at bedtime.     atorvastatin (LIPITOR) 80 MG tablet Take 80 mg by mouth every evening.     B Complex-C (SUPER B-C PO) Take 1 tablet by mouth every evening.     carbidopa-levodopa (SINEMET IR) 25-100 MG tablet Take 1-2 tablets by mouth as directed. Take 2 tablets in the morning, take 1 tablet daily at 11 am, take 1 tablet daily at 3 pm & take 2 tablets at 6 pm.     Carbidopa-Levodopa ER (SINEMET CR) 25-100 MG tablet controlled release Take 1 tablet by mouth at bedtime.     carvedilol (COREG) 6.25 MG tablet Take 1 tablet (6.25 mg total) by mouth 2 (two) times daily. Hold if systolic blood pressure (top number) less than 100 mmHg or pulse less than 60 bpm. 180 tablet 3   clopidogrel (PLAVIX) 75 MG tablet Take 1 tablet (75 mg total) by mouth daily. 30 tablet 2   EPINEPHrine 0.3 mg/0.3 mL IJ SOAJ injection Inject 0.3 mg into the muscle as needed for anaphylaxis.       ferrous sulfate 325 (65 FE) MG tablet Take 325 mg by mouth every other day. At night.     gabapentin (NEURONTIN) 300 MG capsule Take by mouth.     isosorbide mononitrate (IMDUR) 30 MG 24 hr tablet Take 1 tablet (30 mg total) by mouth in the morning. Hold for SBP less than 120 90 tablet 3   Multiple Vitamin (MULTIVITAMIN WITH MINERALS) TABS tablet Take 1 tablet by mouth in the morning.     nitroGLYCERIN (NITROSTAT) 0.4 MG SL tablet Place 0.4 mg under the tongue every 5 (five) minutes as needed for chest pain.      Omega-3 Fatty Acids (OMEGA-3 PO) Take 1 capsule by mouth in the morning.     omeprazole (PRILOSEC) 40 MG capsule Take 40 mg by mouth at bedtime.     Polyethyl Glycol-Propyl Glycol (LUBRICANT EYE DROPS) 0.4-0.3 % SOLN Place 1-2 drops into both eyes 3 (three) times daily as needed (dry/irritated eyes.).     polyethylene glycol (MIRALAX) 17 g packet Take 17 g by mouth in the morning.     tamsulosin (FLOMAX) 0.4 MG CAPS capsule Take 0.4 mg by mouth at bedtime.     zolpidem (AMBIEN) 10 MG tablet Take 10 mg by mouth at bedtime as needed for sleep.  No current facility-administered medications for this visit.    REVIEW OF SYSTEMS:   [X]  denotes positive finding, [ ]  denotes negative finding Cardiac  Comments:  Chest pain or chest pressure:    Shortness of breath upon exertion:    Short of breath when lying flat:    Irregular heart rhythm:    Constitutional    Fever or chills:      PHYSICAL EXAM:   Vitals:   10/19/23 0925  BP: 112/70  Pulse: 75  Temp: 98.2 F (36.8 C)  TempSrc: Temporal  SpO2: 97%  Weight: 177 lb 11.2 oz (80.6 kg)  Height: 6' (1.829 m)    GENERAL: The patient is a well-nourished male, in no acute distress. The vital signs are documented above. CARDIOVASCULAR: There is a regular rate and rhythm. PULMONARY: Non-labored respirations Palpable pedal pulses bilaterally.  Faint left radial pulse  STUDIES:   CT angiogram: VASCULAR   Similar  uncomplicated appearance of proximal descending thoracic aortic fenestrated endograft with left subclavian vein stent graft.   NON-VASCULAR   No acute or significant abdominopelvic abnormality.  MEDICAL ISSUES:   Status post endovascular repair of thoracic aneurysm: CT scan shows excellent position of the stent graft with no complicating features.  The aneurysm is resolved.  He will follow-up in 1 year with a repeat CT angiogram of the chest  Stroke: I am recommending cognitive therapy.  He is also working on keeping his blood pressure a little bit higher to help with cerebral perfusion  Durene Cal, IV, MD, FACS [facility] [phone] Pager [phone]
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
## 2023-10-21 ENCOUNTER — Encounter: Payer: Self-pay | Admitting: Adult Health

## 2023-10-21 ENCOUNTER — Ambulatory Visit (INDEPENDENT_AMBULATORY_CARE_PROVIDER_SITE_OTHER): Payer: 59 | Admitting: Adult Health

## 2023-10-21 VITALS — BP 106/64 | HR 62 | Ht 72.0 in | Wt 177.0 lb

## 2023-10-21 DIAGNOSIS — I69319 Unspecified symptoms and signs involving cognitive functions following cerebral infarction: Secondary | ICD-10-CM | POA: Diagnosis not present

## 2023-10-21 DIAGNOSIS — I951 Orthostatic hypotension: Secondary | ICD-10-CM | POA: Diagnosis not present

## 2023-10-21 DIAGNOSIS — I639 Cerebral infarction, unspecified: Secondary | ICD-10-CM | POA: Diagnosis not present

## 2023-10-21 DIAGNOSIS — G20A1 Parkinson's disease without dyskinesia, without mention of fluctuations: Secondary | ICD-10-CM

## 2023-10-21 DIAGNOSIS — R569 Unspecified convulsions: Secondary | ICD-10-CM | POA: Diagnosis not present

## 2023-10-21 NOTE — Patient Instructions (Signed)
 Referral placed to speech therapy for cognitive therapy - you can call on Friday to schedule if you are not called prior to them  Schedule EEG to evaluate for possible seizure activity  Increase water intake gradually - trying to increase to at least 64 oz of water per day which will help your blood pressure from going too low    Continue aspirin 81 mg daily  and atorvastatin 80mg  daily  for secondary stroke prevention  Continue to follow with vascular surgery - ongoing duration of Plavix will be determined by vascular surgery  Continue to follow with cardiology as scheduled  Continue to follow up with PCP regarding blood pressure and cholesterol management  Maintain strict control of hypertension with blood pressure goal below 130/90 and cholesterol with LDL cholesterol (bad cholesterol) goal below 70 mg/dL.   Signs of a Stroke? Follow the BEFAST method:  Balance Watch for a sudden loss of balance, trouble with coordination or vertigo Eyes Is there a sudden loss of vision in one or both eyes? Or double vision?  Face: Ask the person to smile. Does one side of the face droop or is it numb?  Arms: Ask the person to raise both arms. Does one arm drift downward? Is there weakness or numbness of a leg? Speech: Ask the person to repeat a simple phrase. Does the speech sound slurred/strange? Is the person confused ? Time: If you observe any of these signs, call 911.     Followup in the future with me in 4-6 months with Dr. Pearlean Brownie or call earlier if needed       Thank you for coming to see [place] at [facility]. I hope we have been able to provide you high quality care today.  You may receive a patient satisfaction survey over the next few weeks. We would appreciate your feedback and comments so that we may continue to improve ourselves and the health of our patients.     Local Driver Evaluation Programs:   Comprehensive Evaluation: includes clinical and in vehicle  behind the wheel testing by OCCUPATIONAL THERAPIST. Programs have varying levels of adaptive controls available for trial.   [email or web address]-[email or web address] Evaluator:  Elvera Lennox, OT/CDRS/CDI/SCDCM/Low Vision Certification   [facility] [address] [place], [place] [address] [phone] [email or web address] Evaluators:  Rockwell Alexandria, OT and Yves Dill, OT   [facility]able) [facility] [place] (ONLY SERVES VETERANS!!) [facility] [address] [place], [place]  [address] [phone] [email or web address] Evaluators:  Randall Hiss, KT; Rayburn Felt, KT;  Sheran Luz, KT (KT=kiniesotherapist)     Clinical evaluations only:  Includes clinical testing, refers to other programs or local certified driving instructor for behind the wheel testing.   [facility] (outpatient Rehab) Medical Okey Dupre [address] [place], [place] [address] [phone] for scheduling [email or web address] Evaluators:  Steward Drone, OT; Delma Post, OT   Other area clinical evaluators available upon request including [facility], [facility] and [facility].

## 2023-10-22 NOTE — Progress Notes (Signed)
 I agree with the above plan

## 2023-10-23 ENCOUNTER — Other Ambulatory Visit: Payer: Self-pay | Admitting: Cardiology

## 2023-10-23 DIAGNOSIS — I10 Essential (primary) hypertension: Secondary | ICD-10-CM

## 2023-10-26 ENCOUNTER — Encounter: Payer: Self-pay | Admitting: *Deleted

## 2023-10-26 ENCOUNTER — Other Ambulatory Visit: Admitting: *Deleted

## 2023-10-28 ENCOUNTER — Telehealth: Payer: Self-pay | Admitting: Adult Health

## 2023-10-28 ENCOUNTER — Ambulatory Visit (INDEPENDENT_AMBULATORY_CARE_PROVIDER_SITE_OTHER): Admitting: Neurology

## 2023-10-28 ENCOUNTER — Ambulatory Visit: Attending: Adult Health

## 2023-10-28 ENCOUNTER — Other Ambulatory Visit: Payer: Self-pay

## 2023-10-28 DIAGNOSIS — R41841 Cognitive communication deficit: Secondary | ICD-10-CM | POA: Insufficient documentation

## 2023-10-28 DIAGNOSIS — I69319 Unspecified symptoms and signs involving cognitive functions following cerebral infarction: Secondary | ICD-10-CM | POA: Insufficient documentation

## 2023-10-28 DIAGNOSIS — R569 Unspecified convulsions: Secondary | ICD-10-CM

## 2023-10-28 NOTE — Telephone Encounter (Signed)
 [name]) called patient is here for speech therapy, but we do not have VA authorization. Patient saying VA is the payor. We would need a VA authorization. Have faxed VA authorization to Department of [facility] Phone: [phone] ext: [phone], Fax: [phone]
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
## 2023-10-28 NOTE — Patient Instructions (Signed)
   CONSTANT THERAPY - free for 14 days Trouble areas: Attention and concentration  Understanding what people say  Memory

## 2023-10-29 ENCOUNTER — Encounter: Payer: Self-pay | Admitting: Adult Health

## 2023-11-02 ENCOUNTER — Other Ambulatory Visit: Admitting: *Deleted

## 2023-11-04 ENCOUNTER — Ambulatory Visit

## 2023-11-04 DIAGNOSIS — R41841 Cognitive communication deficit: Secondary | ICD-10-CM | POA: Diagnosis not present
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
## 2023-11-09 ENCOUNTER — Ambulatory Visit

## 2023-11-09 DIAGNOSIS — R41841 Cognitive communication deficit: Secondary | ICD-10-CM | POA: Diagnosis not present
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
## 2023-11-11 ENCOUNTER — Ambulatory Visit: Attending: Adult Health

## 2023-11-11 DIAGNOSIS — R41841 Cognitive communication deficit: Secondary | ICD-10-CM | POA: Insufficient documentation
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
## 2023-11-13 ENCOUNTER — Other Ambulatory Visit: Payer: Self-pay | Admitting: Cardiology

## 2023-11-13 DIAGNOSIS — I951 Orthostatic hypotension: Secondary | ICD-10-CM

## 2023-11-13 DIAGNOSIS — I2510 Atherosclerotic heart disease of native coronary artery without angina pectoris: Secondary | ICD-10-CM

## 2023-11-16 ENCOUNTER — Ambulatory Visit

## 2023-11-18 ENCOUNTER — Telehealth: Payer: Self-pay | Admitting: Adult Health

## 2023-11-18 ENCOUNTER — Ambulatory Visit

## 2023-11-18 DIAGNOSIS — R41841 Cognitive communication deficit: Secondary | ICD-10-CM | POA: Diagnosis not present
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
## 2023-11-18 NOTE — Telephone Encounter (Signed)
 Called Pt to discuss the issue of anxiety. Patient verbally gave consent to speak with his daughter, Victorino Dike as Pt was in the same room. Informed Pt daughter Pt will need to see his PCP in regards to treatment for his anxiety. Dtr asked if it is in relation to his PD will we be able to treat him for anxiety. Discussed that Pt is currently under the care of a neurologist at the [facility] for treatment of his PD and therefore they would need to manage his care for anxiety if it is in fact related to his PD. Dtr stated understanding.

## 2023-11-18 NOTE — Telephone Encounter (Signed)
 Pt's daughter is asking pt be called to discuss his anxiety attacks, daughter (not on [place]) states the attacks are so bad it affects his ability to function, please call pt to discuss.  Daughter reports they will do an updated DPR so she is able to speak for him in the future

## 2023-11-23 ENCOUNTER — Ambulatory Visit

## 2023-11-23 DIAGNOSIS — R41841 Cognitive communication deficit: Secondary | ICD-10-CM

## 2023-11-23 NOTE — Patient Instructions (Signed)
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
## 2023-11-24 NOTE — Telephone Encounter (Signed)
 Pt's wife called to discuss pt's anxiety, one DPR was checked to confirm wife was on there, the message from [place], [place] was read to her.

## 2023-11-25 ENCOUNTER — Ambulatory Visit

## 2023-11-25 DIAGNOSIS — R41841 Cognitive communication deficit: Secondary | ICD-10-CM | POA: Diagnosis not present
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
## 2023-11-30 ENCOUNTER — Ambulatory Visit

## 2023-12-01 ENCOUNTER — Telehealth: Payer: Self-pay | Admitting: Cardiology

## 2023-12-01 ENCOUNTER — Other Ambulatory Visit: Payer: Self-pay

## 2023-12-01 DIAGNOSIS — I2510 Atherosclerotic heart disease of native coronary artery without angina pectoris: Secondary | ICD-10-CM

## 2023-12-01 DIAGNOSIS — I10 Essential (primary) hypertension: Secondary | ICD-10-CM

## 2023-12-01 DIAGNOSIS — I951 Orthostatic hypotension: Secondary | ICD-10-CM

## 2023-12-01 MED ORDER — AMLODIPINE BESYLATE 2.5 MG PO TABS: 2.5000 mg | ORAL_TABLET | Freq: Every evening | ORAL | 3 refills | Status: DC

## 2023-12-01 MED ORDER — CARVEDILOL 6.25 MG PO TABS: 6.2500 mg | ORAL_TABLET | Freq: Two times a day (BID) | ORAL | 3 refills | Status: DC

## 2023-12-01 MED ORDER — ISOSORBIDE MONONITRATE ER 30 MG PO TB24: 30.0000 mg | ORAL_TABLET | Freq: Every morning | ORAL | 3 refills | Status: DC

## 2023-12-01 NOTE — Telephone Encounter (Signed)
*  STAT* If patient is at the pharmacy, call can be transferred to refill team.   1. Which medications need to be refilled? (please list name of each medication and dose if known) carvedilol  (COREG ) 6.25 MG tablet  amLODipine  (NORVASC ) 2.5 MG tablet  isosorbide  mononitrate (IMDUR ) 30 MG 24 hr tablet   2. Which pharmacy/location (including street and city if local pharmacy) is medication to be sent to?  3. Do they need a 30 day or 90 day supply? 90 Needs scripts faxed over to [facility]  @ [phone]  Spouse requesting cb to advise when done

## 2023-12-01 NOTE — Telephone Encounter (Signed)
 Spoke with pt's wife per DPR on file from 08/2023 and explained that the pt's prescriptions have been faxed to the [place] at the fax number provided. Pt's wife verbalized understanding and had no further questions.

## 2023-12-02 ENCOUNTER — Ambulatory Visit

## 2023-12-07 ENCOUNTER — Ambulatory Visit

## 2023-12-09 ENCOUNTER — Ambulatory Visit

## 2023-12-14 ENCOUNTER — Ambulatory Visit

## 2023-12-16 ENCOUNTER — Encounter

## 2023-12-21 ENCOUNTER — Encounter: Admitting: Speech Pathology

## 2023-12-23 ENCOUNTER — Encounter

## 2023-12-30 ENCOUNTER — Telehealth: Payer: Self-pay | Admitting: Adult Health

## 2023-12-30 NOTE — Telephone Encounter (Signed)
 Pt's wife called stating that that the pt is having dizzy spells and being light headed when he tries to walk. She would like to be called back to discuss. Pt's daughter was trying to have nurse call her back but daughter Kolleen Perone is not on the [facility]. Please advise.

## 2023-12-30 NOTE — Telephone Encounter (Signed)
 Spoke w/Pt and wife regarding call about "dizzy spells". Wife stated when Pt tries to go from sitting to standing and tries to walk somewhere he gets dizzy and has to hurry back to chair to sit down. Wife stated the cardiologist had performed orthostatic BP and made some changes to medication but could not remember which medication was changed. Wife reports the Pt was started on an antidepressant duloxetine and has been sleeping more. Discussed possible BP changes since the dizziness occurs when Pt stands up, advised wife to call cardiology office as sounds like BP may be dropping when Pt changes position and may need medication adjustments that cardiologist manages. Wife voiced understanding. Wife also asked about a referral to [facility] neurology for PD management since a provider there specializes in PD. Informed wife since PD is managed by provider at [facility] Pt PCP can give the referral to [place]. Wife voiced understanding and thankful for the call back.

## 2024-01-27 ENCOUNTER — Telehealth: Payer: Self-pay | Admitting: Adult Health

## 2024-01-27 NOTE — Telephone Encounter (Signed)
 Wife is asking for a call to discuss dizziness pt is having,  once last week and once this week.

## 2024-01-28 ENCOUNTER — Telehealth: Payer: Self-pay | Admitting: Cardiology

## 2024-01-28 NOTE — Telephone Encounter (Signed)
 Pt wife called asking about Pt being able to drive. Informed wife noted in Pt visit on 10/21/23 pursuing driving was discussed driving rehab available and additional info was provided. Wife stated she does not recall receiving info about that. Informed wife will f/u with NP and get back to her with information. Wife voiced understanding and thanks for the f/u.

## 2024-01-28 NOTE — Telephone Encounter (Signed)
 STAT if patient feels like he/she is going to faint   1. Are you feeling dizzy, lightheaded, or faint right now? No     2. Have you passed out?  No (If yes move to .[facility])   3. Do you have any other symptoms? No    4. Have you checked your HR and BP (record if available)? 06/19 9:00 AM 93/51 HR 70  Now 107/61 HR 52 while sitting  Dizziness when standing up. Happened last week and yesterday. Wife reports they contacted neurology and they were admit they believe it is his BP. Unable to get standing BP, due to dizziness.   Spouse asked if she should attempt standing BP again and if it was okay for arm to be hanging when taken. Advised I cannot give medical advise, so the should wait for cb.

## 2024-01-28 NOTE — Telephone Encounter (Signed)
 Spoke with pt and his wife and explained Dr. Jesus Morones recommendations of holding the Amlodipine  to see if the pt's symptoms subside. Pt verbalized understanding of plan and had no further questions at this time.

## 2024-01-28 NOTE — Telephone Encounter (Signed)
 Spoke with pt who stated that he has felt better as the day has gone on but had a bad day with the dizziness yesterday and Monday. Pt verified that he takes his Carvedilol  6.25 mg BID as long as his SBP is above 100, takes Imdur  30 mg once daily in the AM as long as SBP is above 120 and takes Amlodipine  2.5 mg once daily in the PM as long as SBP is above 120. Pt denies any recent illness and states he has been very diligent on making sure he gets enough water intake.

## 2024-01-28 NOTE — Telephone Encounter (Signed)
 Spoke w/Pt wife who stated in past couple of weeks Pt has had episodes of dizziness when he stands up to walk to the kitchen. She stated Pt gets so dizzy he has to go back to his chair and sit down. Sometimes the dizziness lasts all day and sometimes it just suddenly stops. Discussed Pt hypotensive episodes in the past and this sounds very similar to what he has had in the past. Wife stated she says the dizziness starts quicker now. Discussed that with orthostatic hypotension it can cause dizziness as soon as the person stands up because the blood pressure drops quickly. Referred to the notes in Pt last ofc visit where NP recommended Pt continue close f/u with PCP for hypotension monitoring. Wife stated Pt has a new PCP and saw them recently but they didn't discuss the dizziness. Encouraged wife to reach out to PCP or cardiologist that possibly Pt BP meds may need to be adjusted again. Wife voiced understanding and thankful for the call back.

## 2024-01-28 NOTE — Telephone Encounter (Signed)
 Wife has called, asking to speak back with RN

## 2024-01-28 NOTE — Telephone Encounter (Signed)
 Please call and find out how he is doing. He has holding parameters on majority of all the blood pressure medications. Find out how they are taking it morning and evening. Any recent sick illnesses, dehydration, etc. Olinda Bertrand, DO, [facility]

## 2024-01-28 NOTE — Telephone Encounter (Signed)
 Spoke with Pt. Pt asked me to speak to his wife. Wife states [name] has been getting dizzy spells. Starts after standing up. Pt does check Bps before taking medications. Yesterday was a bad dizzy spell day. Pt denies any of symptoms. BP before medication was 121/68 HR 72. Last night was BP 128/65 HR 54. This morning BP 93/51 HR 70. They believe they just held the coreg  this morning. Did not hold medications yesterday. Spoke to them about increase fluid intake as they stated he only drank 1 or 2 bottles of water. Spoke about standing slowly and sitting slowly. Call disconnected at the end. Forwarding to provider for review.

## 2024-01-28 NOTE — Telephone Encounter (Signed)
 Can hold amlodipine  for now given his symptoms.   Shantai Tiedeman Escalon, DO, [facility]

## 2024-02-01 ENCOUNTER — Encounter (HOSPITAL_BASED_OUTPATIENT_CLINIC_OR_DEPARTMENT_OTHER): Payer: Self-pay

## 2024-02-01 ENCOUNTER — Emergency Department (HOSPITAL_BASED_OUTPATIENT_CLINIC_OR_DEPARTMENT_OTHER)
Admission: EM | Admit: 2024-02-01 | Discharge: 2024-02-01 | Disposition: A | Discharge status: Home / Self Care | Attending: Emergency Medicine | Admitting: Emergency Medicine

## 2024-02-01 ENCOUNTER — Other Ambulatory Visit: Payer: Self-pay

## 2024-02-01 ENCOUNTER — Emergency Department (HOSPITAL_BASED_OUTPATIENT_CLINIC_OR_DEPARTMENT_OTHER): Admitting: Radiology

## 2024-02-01 DIAGNOSIS — I959 Hypotension, unspecified: Secondary | ICD-10-CM | POA: Diagnosis present

## 2024-02-01 DIAGNOSIS — I951 Orthostatic hypotension: Secondary | ICD-10-CM | POA: Insufficient documentation

## 2024-02-01 DIAGNOSIS — Z7982 Long term (current) use of aspirin: Secondary | ICD-10-CM | POA: Insufficient documentation

## 2024-02-01 DIAGNOSIS — G20C Parkinsonism, unspecified: Secondary | ICD-10-CM | POA: Diagnosis not present

## 2024-02-01 DIAGNOSIS — Z79899 Other long term (current) drug therapy: Secondary | ICD-10-CM | POA: Diagnosis not present

## 2024-02-01 DIAGNOSIS — I2510 Atherosclerotic heart disease of native coronary artery without angina pectoris: Secondary | ICD-10-CM | POA: Diagnosis not present

## 2024-02-01 DIAGNOSIS — Z7902 Long term (current) use of antithrombotics/antiplatelets: Secondary | ICD-10-CM | POA: Insufficient documentation

## 2024-02-01 DIAGNOSIS — I10 Essential (primary) hypertension: Secondary | ICD-10-CM | POA: Insufficient documentation

## 2024-02-01 LAB — CBC WITH DIFFERENTIAL/PLATELET
Abs Immature Granulocytes: 0.01 10*3/uL (ref 0.00–0.07)
Basophils Absolute: 0.1 10*3/uL (ref 0.0–0.1)
Basophils Relative: 1 %
Eosinophils Absolute: 0.2 10*3/uL (ref 0.0–0.5)
Eosinophils Relative: 4 %
HCT: 40.3 % (ref 39.0–52.0)
Hemoglobin: 14.6 g/dL (ref 13.0–17.0)
Immature Granulocytes: 0 %
Lymphocytes Relative: 20 %
Lymphs Abs: 1 10*3/uL (ref 0.7–4.0)
MCH: 33.9 pg (ref 26.0–34.0)
MCHC: 36.2 g/dL — ABNORMAL HIGH (ref 30.0–36.0)
MCV: 93.5 fL (ref 80.0–100.0)
Monocytes Absolute: 0.5 10*3/uL (ref 0.1–1.0)
Monocytes Relative: 9 %
Neutro Abs: 3.3 10*3/uL (ref 1.7–7.7)
Neutrophils Relative %: 66 %
Platelets: 134 10*3/uL — ABNORMAL LOW (ref 150–400)
RBC: 4.31 MIL/uL (ref 4.22–5.81)
RDW: 12.9 % (ref 11.5–15.5)
WBC: 5.1 10*3/uL (ref 4.0–10.5)
nRBC: 0 % (ref 0.0–0.2)

## 2024-02-01 LAB — COMPREHENSIVE METABOLIC PANEL WITH GFR
ALT: 8 U/L (ref 0–44)
AST: 19 U/L (ref 15–41)
Albumin: 4.2 g/dL (ref 3.5–5.0)
Alkaline Phosphatase: 89 U/L (ref 38–126)
Anion gap: 10 (ref 5–15)
BUN: 23 mg/dL (ref 8–23)
CO2: 27 mmol/L (ref 22–32)
Calcium: 9.1 mg/dL (ref 8.9–10.3)
Chloride: 102 mmol/L (ref 98–111)
Creatinine, Ser: 1.22 mg/dL (ref 0.61–1.24)
GFR, Estimated: >60 mL/min (ref >60)
Glucose, Bld: 103 mg/dL — ABNORMAL HIGH (ref 70–99)
Potassium: 4.4 mmol/L (ref 3.5–5.1)
Sodium: 139 mmol/L (ref 135–145)
Total Bilirubin: 1 mg/dL (ref 0.0–1.2)
Total Protein: 6.3 g/dL — ABNORMAL LOW (ref 6.5–8.1)

## 2024-02-01 LAB — URINALYSIS, ROUTINE W REFLEX MICROSCOPIC
Bilirubin Urine: NEGATIVE
Glucose, UA: NEGATIVE mg/dL
Hgb urine dipstick: NEGATIVE
Leukocytes,Ua: NEGATIVE
Nitrite: NEGATIVE
Protein, ur: NEGATIVE mg/dL
Specific Gravity, Urine: 1.028 (ref 1.005–1.030)
pH: 5.5 (ref 5.0–8.0)

## 2024-02-01 LAB — TROPONIN T, HIGH SENSITIVITY: Troponin T High Sensitivity: <15 ng/L (ref <19)

## 2024-02-01 MED ORDER — LACTATED RINGERS IV BOLUS
1000.0000 mL | Freq: Once | INTRAVENOUS | Status: AC
  Administered 2024-02-01: 1000 mL via INTRAVENOUS

## 2024-02-01 NOTE — ED Notes (Signed)
 Reviewed discharge instructions and home care with pt and family. Pt verbalized understanding and had no further questions. Pt exited ED without complications.

## 2024-02-01 NOTE — ED Notes (Signed)
 ED Provider at bedside.

## 2024-02-01 NOTE — ED Triage Notes (Addendum)
 Pt c/o hypotension x a couple days, daughter advised that pt's BP has been bottoming out all day- 80's, advised by cardiology to come be monitored & get fluids.  Pt a&o4, GCS 15 at time of triage  Daughter advises a couple stumble/ falls today.
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
## 2024-02-01 NOTE — Telephone Encounter (Addendum)
 104/65 99/50 100/52 97/50 - most recent   Pts wife says that he has been stumbling and staggering today more than his usual... he says that his head does not feel right.   He has eaten and drank fluids but his BP still dropped to 81/45.   She has agreed that she needs to take him to the ED for more immediate assessment.

## 2024-02-01 NOTE — Discharge Instructions (Signed)
 You were seen today for orthostatic hypotension.  With you having been provided 2 L of fluid, your blood pressure had significantly improved.  Your other labs and imaging as well as your physical exam were reassuring I have low suspicion for any emergent causes of your symptoms.  However recommend continued follow-up with your cardiologist for blood pressure management.  Return to the ED if you continue any new or worsening symptoms.  Recommend you continue to stay hydrated, ingesting at least 8 cups of water daily.

## 2024-02-01 NOTE — Telephone Encounter (Signed)
 Pt c/o BP issue: STAT if pt c/o blurred vision, one-sided weakness or slurred speech.  STAT if BP is GREATER than 180/120 TODAY.  STAT if BP is LESS than 90/60 and SYMPTOMATIC TODAY  1. What is your BP concern? Concerned pt bp has been low since holding Amlodipine    2. Have you taken any BP medication today? Yes   3. What are your last 5 BP readings? All of these taken 3hrs after medication  104/65 99/50 100/52 97/50 - most recent   4. Are you having any other symptoms (ex. Dizziness, headache, blurred vision, passed out)? No, she said he just said my head doesn't feel right. She said He has been stumbling when walking today. Denies blurry vision or headache.

## 2024-02-01 NOTE — ED Notes (Signed)
Patient transported to X-Ray 

## 2024-02-02 NOTE — Telephone Encounter (Signed)
 Spoke with pt as well as wife and daughter over the phone and explained Dr. Tyree recommendations of continuing to hold Amlodipine  and to also hold Imdur  going forward but to continue Carvedilol . Encouraged increasing oral hydration as well. Family stated that vascular sx was adamant that this issue was not from recent procedure but pt also has a 6 month f/u appt within the next week with their office as well and will be evaluated there too. No further questions or concerns at this time.

## 2024-02-02 NOTE — Telephone Encounter (Signed)
 Pt was seen in the ED yesterday and he was very orthostatic.... he felt better after fluids... he is hydrating well at home.. his CMET in the ED showed a normal Creatinine.   I made him an appt with Dr Michele 02/15/24 and I will send to him for his review and any changes needed prior to the appt.

## 2024-02-02 NOTE — Telephone Encounter (Signed)
 Patient's wife is following up. They went to the ED as advised. Yesterday at the ED BP was 110/64 laying down, 85/59 sitting, 63/45 standing. Then at night it was 140/78 128/74 sitting, 124/70 standing. Patient has been discharged. Wife would like to know what he needs to do now.

## 2024-02-02 NOTE — Telephone Encounter (Signed)
 Randall Moore,  Reviewed his recent labs, degree of hemoconcentration, increased fluid intake. He already has holding parameters on his blood pressure medications-continue to follow. Continue carvedilol  as prescribed. Already recommended to hold amlodipine . Hold Imdur  going forward due to continued episodes of orthostasis. This may be secondary to progression of Parkinson's disease which can lead to autonomic dysfunction-discussed further with his neurology.   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 Will see him back in follow-up on February 15, 2024.  Randall Freeze [place], DO, FACC

## 2024-02-15 ENCOUNTER — Encounter: Payer: Self-pay | Admitting: Cardiology

## 2024-02-15 ENCOUNTER — Ambulatory Visit: Attending: Cardiology | Admitting: Cardiology

## 2024-02-15 VITALS — BP 100/63 | HR 89 | Resp 16 | Ht 72.0 in | Wt 165.2 lb

## 2024-02-15 DIAGNOSIS — I719 Aortic aneurysm of unspecified site, without rupture: Secondary | ICD-10-CM

## 2024-02-15 DIAGNOSIS — E782 Mixed hyperlipidemia: Secondary | ICD-10-CM

## 2024-02-15 DIAGNOSIS — G20A1 Parkinson's disease without dyskinesia, without mention of fluctuations: Secondary | ICD-10-CM

## 2024-02-15 DIAGNOSIS — I10 Essential (primary) hypertension: Secondary | ICD-10-CM | POA: Diagnosis not present

## 2024-02-15 DIAGNOSIS — I951 Orthostatic hypotension: Secondary | ICD-10-CM

## 2024-02-15 DIAGNOSIS — I2510 Atherosclerotic heart disease of native coronary artery without angina pectoris: Secondary | ICD-10-CM

## 2024-02-15 MED ORDER — CARVEDILOL 3.125 MG PO TABS
3.1250 mg | ORAL_TABLET | Freq: Two times a day (BID) | ORAL | 3 refills | Status: DC
Start: 2024-02-15 — End: 2024-02-15

## 2024-02-15 NOTE — Patient Instructions (Addendum)
 Medication Instructions:  STOP Carvedilol    *If you need a refill on your cardiac medications before your next appointment, please call your pharmacy*  Lab Work: None. If you have labs (blood work) drawn today and your tests are completely normal, you will receive your results only by: MyChart Message (if you have MyChart) OR A paper copy in the mail If you have any lab test that is abnormal or we need to change your treatment, we will call you to review the results.  Testing/Procedures: None.  Follow-Up: At [facility], you and your health needs are our priority.  As part of our continuing mission to provide you with exceptional heart care, our providers are all part of one team.  This team includes your primary Cardiologist (physician) and Advanced Practice Providers or APPs (Physician Assistants and Nurse Practitioners) who all work together to provide you with the care you need, when you need it.  Your next appointment:   6 months.  Provider:   Madonna Large, DO    We recommend signing up for the patient portal called MyChart.  Sign up information is provided on this After Visit Summary.  MyChart is used to connect with patients for Virtual Visits (Telemedicine).  Patients are able to view lab/test results, encounter notes, upcoming appointments, etc.  Non-urgent messages can be sent to your provider as well.   To learn more about what you can do with MyChart, go to [email or web address].

## 2024-02-15 NOTE — Progress Notes (Signed)
 Cardiology Office Note:  .   Date:  02/15/2024  ID:  Randall Moore., DOB November 05, 1945, MRN [id number] PCP:  System, Provider Not In  Former Cardiology Providers: Dr. Delford and Dr. Lad[facility] Providers Cardiologist:  Madonna Large, DO , [facility] (established care 01/11/2021) Electrophysiologist:  None  Click to update primary MD,subspecialty MD or APP then REFRESH:1}    Chief Complaint  Patient presents with   Orthostatic hypotension    History of Present Illness: .   Randall Moore. is a 78 y.o. 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  For which he was placed on amlodipine  and Imdur .    In the past he had a mechanical fall while cutting trees was hospitalized due to orbital fractures, T2/T3 fractures, abrasions, and scalp lacerations.  In the recent past he had CT of the chest which noted penetrating aortic ulcer on the underside of the mid aortic arch with surrounding intramural hemorrhage/mural thickening.  He was evaluated by cardiothoracic surgeon Dr. Lucas as well as vascular surgery.  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  Patient was discharged home and later presents to the hospital and was diagnosed with acute/subacute stroke.  No obvious residual defects with the exception of some cognitive impairment per daughter would mention her concerns during her prior visits.  Neurology had placed him on 3 months of dual antiplatelet therapy  followed by aspirin  alone.  Working diagnosis from the neurology standpoint was that his stroke was likely embolic or periprocedural.    In the recent past he been having issues with what appears to be orthostatic hypotension.  Since last office visit he has been weaned off of amlodipine  and eventually discontinued.  Imdur  has also been discontinued.  He is only on carvedilol  6.25 mg p.o. twice daily with holding parameters.  Home blood pressures are acceptable based on his wife's documentation.  His daughter who accompanies and provides collateral history and is concerned that he has been having progressive symptoms from his Parkinson's disease as well.  He also gets lightheaded and dizzy predominantly with sitting to standing.  She also mentions that he is dehydrated but is also reluctant to increase his water intake.  His lightheaded dizziness usually improves after he receives IV fluids, suggestive of dehydration etiology.  He has an appointment with vascular surgery this coming Wednesday as well.    Review of Systems: .   Review of Systems  Cardiovascular:  Negative for chest pain, claudication, dyspnea on exertion, irregular heartbeat, leg swelling, near-syncope, orthopnea, palpitations, paroxysmal nocturnal dyspnea and syncope.  Respiratory:  Negative for shortness of breath.   Hematologic/Lymphatic: Negative for bleeding problem.  Musculoskeletal:  Positive for falls and joint pain. Negative for muscle cramps and myalgias.  Neurological:  Positive for dizziness, light-headedness, tremors (Resting) and vertigo.    Studies Reviewed:   EKG Interpretation Date/Time:  Monday February 15 2024 15:32:41 EDT Ventricular Rate:  67 PR Interval:  158 QRS Duration:  92 QT Interval:  412 QTC Calculation: 435 R Axis:   68  Text Interpretation: Normal sinus rhythm with sinus  arrhythmia Normal ECG When compared with ECG of 01-Feb-2024 17:35, No significant change since last tracing Confirmed by Michele Richardson [phone]) on 02/15/2024 4:18:17 PM  Echocardiogram: 09/02/2023 LVEF 55 to 60%, grade 1 diastolic dysfunction. Right ventricular size is mildly dilated, function normal. Mild MR Trivial AR Ascending aorta 41 mm, dilated. Estimated RAP 8 mmHg.  Left heart catheterization 02/05/2021: LV 110/1, EDP 9 mmHg.  Ao 112/59, mean 81 mmHg.  There was no pressure clinical statical. LM: Smooth and normal. LAD: Proximal LAD smooth 40% stenosis.  There is minimal disease in the midsegment.  Moderate-sized D1 and D2. Ramus: Large vessel, has secondary branches.  Minimal disease is evident. RCA: Dominant.  Very mild disease in the PL branch.   Recommendation: Patient had very similar anatomy in 2017 angiography.  No change in the coronary anatomy, previously he has had FFR/PressureWire evaluation of the proximal LAD stenosis.  Hence I do not think he needs further evaluation of this lesion by physiologic means.  Suspect the lesion is only moderate.  Suspect microvascular angina or coronary spasm to be the etiology, we will try isosorbide  mononitrate 60 mg daily.  30 mill contrast utilized.   RADIOLOGY: CT Angio Chest Aorta W and/or Wo Contrast 07/25/2023 1. Penetrating atherosclerotic ulcer with small amount of surrounding intramural hemorrhage or mural thickening demonstrating slight interval increase in size since prior examination of 02/07/2022. No mediastinal hematoma. No aortic dissection or aneurysm. 2. Fluid-filled esophagus suggesting changes of gastroesophageal reflux or esophageal dysmotility. 3. Stable compression deformities of T2 and T3.   Aortic Atherosclerosis (ICD10-I70.0).  MRI Brain w/o contrast  09/11/2023 1. 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 2. Additional punctate focus of increased signal on diffusion-weighted imaging without ADC correlate in the left posterior temporal lobe, which may represent a subacute infarct.  Risk Assessment/Calculations:   NA   Labs:        Latest Ref Rng & Units 02/01/2024    6:55 PM 09/12/2023    2:40 AM 09/11/2023   12:45 PM  CBC  WBC 4.0 - 10.5 K/uL 5.1  8.6  9.2   Hemoglobin 13.0 - 17.0 g/dL 85.3  86.5  86.6   Hematocrit 39.0 - 52.0 % 40.3  38.9  36.9   Platelets 150 - 400 K/uL 134  113  110        Latest Ref Rng & Units 02/01/2024    6:55 PM 09/12/2023    2:40 AM 09/11/2023   12:45 PM  BMP  Glucose 70 - 99 mg/dL 896  888  96   BUN 8 - 23 mg/dL 23  16  14    Creatinine 0.61 - 1.24 mg/dL 8.77  8.82  8.81   Sodium 135 - 145 mmol/L 139  136  136   Potassium 3.5 - 5.1 mmol/L 4.4  4.1  3.5   Chloride 98 - 111 mmol/L 102  105  104   CO2 22 - 32 mmol/L 27  21  24    Calcium  8.9 - 10.3 mg/dL 9.1  8.2  7.9       Latest Ref Rng & Units 02/01/2024    6:55 PM 09/12/2023    2:40 AM 09/11/2023   12:45 PM  CMP  Glucose 70 - 99 mg/dL 896  888  96   BUN 8 - 23 mg/dL 23  16  14    Creatinine 0.61 - 1.24 mg/dL 8.77  8.82  8.81   Sodium 135 - 145 mmol/L 139  136  136  Potassium 3.5 - 5.1 mmol/L 4.4  4.1  3.5   Chloride 98 - 111 mmol/L 102  105  104   CO2 22 - 32 mmol/L 27  21  24    Calcium  8.9 - 10.3 mg/dL 9.1  8.2  7.9   Total Protein 6.5 - 8.1 g/dL 6.3  5.5  5.3   Total Bilirubin 0.0 - 1.2 mg/dL 1.0  2.4  1.4   Alkaline Phos 38 - 126 U/L 89  79  77   AST 15 - 41 U/L 19  16  16    ALT 0 - 44 U/L 8  <5  9     Lab Results  Component Value Date   CHOL 91 09/13/2023   HDL 31 (L) 09/13/2023   LDLCALC 47 09/13/2023   TRIG 67 09/13/2023   CHOLHDL 2.9 09/13/2023   No results for input(s): LIPOA in the last 8760 hours. No components found for: NTPROBNP No results for input(s): PROBNP in the last 8760 hours. No results for input(s): TSH in the last 8760 hours.  Physical Exam:    Today's Vitals   02/15/24 1532 02/15/24 1638 02/15/24 1641 02/15/24 1642  BP: 100/63     Pulse: 89     Resp: 16     SpO2: 97% 95% 97% 96%  Weight: 165 lb 3.2 oz (74.9 kg)     Height: 6' (1.829 m)      Body mass index is 22.41  kg/m. Wt Readings from Last 3 Encounters:  02/15/24 165 lb 3.2 oz (74.9 kg)  10/21/23 177 lb (80.3 kg)  10/19/23 177 lb 11.2 oz (80.6 kg)   Orthostatic VS for the past 72 hrs (Last 3 readings):  Orthostatic BP Patient Position BP Location Cuff Size Orthostatic Pulse  02/15/24 1642 (!) 68/45 Standing Right Arm Normal 79  02/15/24 1641 92/60 Sitting Right Arm Normal 63  02/15/24 1638 129/75 Supine Right Arm Normal 63  Measurements performed on right upper extremity   Physical Exam  Constitutional: No distress.  Age appropriate, hemodynamically stable.   Neck: No JVD present.  Cardiovascular: Normal rate, regular rhythm, S1 normal, S2 normal, intact distal pulses and normal pulses. Exam reveals no gallop, no S3 and no S4.  No murmur heard. Pulmonary/Chest: Effort normal and breath sounds normal. No stridor. He has no wheezes. He has no rales.  Abdominal: Soft. Bowel sounds are normal. He exhibits no distension. There is no abdominal tenderness.  Musculoskeletal:        General: No edema.     Cervical back: Neck supple.  Neurological: He is alert and oriented to person, place, and time. He has intact cranial nerves (2-12).  Skin: Skin is warm and moist.    Impression & Recommendation(s):  Impression:   ICD-10-CM   1. Orthostatic hypotension  I95.1 EKG 12-Lead    2. Benign hypertension  I10 DISCONTINUED: carvedilol  (COREG ) 3.125 MG tablet    3. Coronary artery disease involving native coronary artery of native heart without angina pectoris  I25.10     4. Mixed hyperlipidemia  E78.2     5. Penetrating atherosclerotic ulcer of aorta (HCC)  I71.9     6. Parkinson's disease, unspecified whether dyskinesia present, unspecified whether manifestations fluctuate (HCC)  G20.A1        Recommendation(s):   Orthostatic hypotension Orthostatic vital signs are profoundly positive. We will likely discontinue amlodipine  and Imdur . Recommend carvedilol  6.25 mg p.o. twice daily, recommend  discontinuation of it as well. Continue to monitor blood  pressures at home, if he continues to remain symptomatic we will need to discuss midodrine. Other contributing factors: Dehydration, has felt better with IV fluids in the past and most recent blood work also notes hemoconcentration.  Reemphasized the importance of appropriate hydration on regular basis.  Especially when he feels lightheaded and dizzy also increase salt intake And daughter is also noted progression of Parkinson's symptoms -recommended that he follow-up with neurology as this is very well a contributing factor as it leads to dysautonomia Has an upcoming appointment with vascular surgery as well to make sure that the recent intervention with endovascular repair there is no contributing factor to these episodes Reemphasized importance of changing positions slowly Recommended talking to insurance company/DME with regards to consideration and access to compression stockings as well as abdominal binders. Avoiding straining, coughing, and walking in hot weather.These activities reduce venous return and worsen orthostatic hypotension. Avoiding over-heating. Raising the head of the bed 30 to 45 degrees decreases renal perfusion, thereby activating the renin-angiotensin-aldosterone system and decreasing nocturnal diuresis, which can be pronounced in these patients. Exercise - walking 30 minutes a day, exercise in a swimming pool, exercise in a recumbent or seated position (using a stationary bike or rowing machine) Increased salt and water intake to 2 L to 2.5 L of water a day Modification of meals: Avoiding large meals Ingesting meals low in carbohydrates Alcohol  should be avoided during the day as it is a vasodilator Drink water with meals Avoiding activities or sudden standing immediately after eating Physical Countermaneuvers during daily activities: leg crossing, standing on tip toes, squatting  Benign hypertension History of  hypertension, not orthostatic. Medications as discussed above  Coronary artery disease involving native coronary artery of native heart without angina pectoris Denies anginal chest pain or heart failure symptoms. Left heart catheterization June 2022-nonobstructive disease in the LAD likely secondary to microvascular angina/coronary spasm Placed on Imdur  as well as amlodipine  which are now held secondary to orthostasis  Mixed hyperlipidemia Currently on Lipitor  80 mg p.o. nightly.  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 Home and office blood pressures are well-controlled - meds changes as discussed above. Noted on a CT scan from December 2024. 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 Follows with neurology at the [facility].  Orders Placed:  Orders Placed This Encounter  Procedures   EKG 12-Lead     Final Medication List:    Meds ordered this encounter  Medications   DISCONTD: carvedilol  (COREG ) 3.125 MG tablet    Sig: Take 1 tablet (3.125 mg total) by mouth 2 (two) times daily. Hold if systolic blood pressure (top number) less than 100 mmHg or pulse less than 60 bpm.    Dispense:  30 tablet    Refill:  3    Decreasing to 3.125 mg    Medications Discontinued During This Encounter  Medication Reason   clopidogrel  (PLAVIX ) 75 MG tablet Completed Course   gabapentin  (NEURONTIN ) 300 MG capsule Completed Course   isosorbide  mononitrate (IMDUR ) 30 MG 24 hr tablet Side effect (s)   omeprazole  (PRILOSEC) 40 MG capsule    Polyethyl Glycol-Propyl Glycol (LUBRICANT EYE DROPS) 0.4-0.3  % SOLN Patient Preference   tamsulosin  (  FLOMAX ) 0.4 MG CAPS capsule Completed Course   carvedilol  (COREG ) 6.25 MG tablet    carvedilol  (COREG ) 3.125 MG tablet Discontinued by provider     Current Outpatient Medications:    acetaminophen  (TYLENOL ) 500 MG tablet, Take 1,000 mg by mouth daily as needed for mild pain (pain score 1-3)., Disp: , Rfl:    aspirin  EC 81 MG tablet, Take 81 mg by mouth at bedtime., Disp: , Rfl:    atorvastatin  (LIPITOR ) 80 MG tablet, Take 80 mg by mouth every evening., Disp: , Rfl:    B Complex-C (SUPER B-C PO), Take 1 tablet by mouth every evening., Disp: , Rfl:    carbidopa -levodopa  (SINEMET  IR) 25-100 MG tablet, Take 1-2 tablets by mouth as directed. Take 2 tablets in the morning, take 1 tablet daily at 11 am, take 1 tablet daily at 3 pm & take 2 tablets at 6 pm., Disp: , Rfl:    Carbidopa -Levodopa  ER (SINEMET  CR) 25-100 MG tablet controlled release, Take 1 tablet by mouth at bedtime., Disp: , Rfl:    EPINEPHrine 0.3 mg/0.3 mL IJ SOAJ injection, Inject 0.3 mg into the muscle as needed for anaphylaxis. , Disp: , Rfl:    ferrous sulfate  325 (65 FE) MG tablet, Take 325 mg by mouth every other day. At night., Disp: , Rfl:    Multiple Vitamin (MULTIVITAMIN WITH MINERALS) TABS tablet, Take 1 tablet by mouth in the morning., Disp: , Rfl:    nitroGLYCERIN  (NITROSTAT ) 0.4 MG SL tablet, Place 0.4 mg under the tongue every 5 (five) minutes as needed for chest pain. , Disp: , Rfl:    Omega-3 Fatty Acids (OMEGA-3 PO), Take 1 capsule by mouth in the morning., Disp: , Rfl:    polyethylene glycol (MIRALAX ) 17 g packet, Take 17 g by mouth in the morning., Disp: , Rfl:    zolpidem  (AMBIEN ) 10 MG tablet, Take 10 mg by mouth at bedtime as needed for sleep. (Patient not taking: Reported on 02/15/2024), Disp: , Rfl:   Consent:   NA  Disposition:    6 month follow up   His questions and concerns were addressed to his satisfaction. He voices understanding of the recommendations provided  during this encounter.    Signed, Madonna Michele HAS, [facility]  A Division of [facility] [address] [place], [place] [address]  [place], [place] [address]  02/15/2024

## 2024-02-17 ENCOUNTER — Encounter: Payer: Self-pay | Admitting: Neurology

## 2024-02-17 ENCOUNTER — Ambulatory Visit (INDEPENDENT_AMBULATORY_CARE_PROVIDER_SITE_OTHER): Admitting: Neurology

## 2024-02-17 ENCOUNTER — Telehealth: Payer: Self-pay | Admitting: Neurology

## 2024-02-17 VITALS — BP 80/52 | HR 66 | Ht 70.0 in | Wt 165.0 lb

## 2024-02-17 DIAGNOSIS — G20C Parkinsonism, unspecified: Secondary | ICD-10-CM | POA: Diagnosis not present

## 2024-02-17 DIAGNOSIS — Z8673 Personal history of transient ischemic attack (TIA), and cerebral infarction without residual deficits: Secondary | ICD-10-CM

## 2024-02-17 MED ORDER — CARBIDOPA-LEVODOPA 25-100 MG PO TABS: 1.0000 | ORAL_TABLET | ORAL | 2 refills | Status: DC

## 2024-02-17 NOTE — Telephone Encounter (Signed)
 Referral for neurology, Dr. 

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
## 2024-02-17 NOTE — Patient Instructions (Signed)
 I had a long discussion with the patient, his wife and daughter regarding his parkinsonian tremor which appears to have gotten worse recently.  I recommend we change medication regimen to Sinemet  CR 25/100 mg 1 tablet with breakfast and 1 at bedtime along with Sinemet  IR 100 mg 1 tablet at 8 AM, noon and 4 PM.  Refer to Dr. 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  Continue aspirin  for stroke prevention and maintain aggressive risk factor modifications to control hypertension blood pressure goal below 130/90, lipids with LDL cholesterol goal below 70 mg percent and diabetes hemoglobin A1c goal below 6.5%.  He will continue follow-up with practitioner in 6 months or call earlier if necessary.  Parkinson's Disease Parkinson's disease causes problems with movement. It makes it harder for you to walk or control your body. Each person with the disease is affected differently. Treatments can help you manage your symptoms. Parkinson's disease can range from mild to very bad, but it gets worse over time. This often happens slowly over many years. What are the causes? Parkinson's disease is caused by a loss of brain cells called neurons. These brain cells make a substance called dopamine, which is needed to control body movement. It's not known what causes the brain cells to die. What increases the risk? Being male. Being 60 years of age or older. Having a family history of Parkinson's disease. Having an injury to the brain in the past. Being around things that are harmful or poisonous, such as pesticides. Having depression. This is when you feel sad or hopeless. What are the signs or symptoms? Symptoms can vary and get worse over time. The main symptoms can be seen in your movement. These include: Shaking or tremors that you can't control. This happens while you're resting. Stiffness in your arms and legs. Losing facial expressions. Walking in a way that isn't normal. You may  walk with short, shuffling steps. Loss of balance when standing. You may sway, fall backward, or have trouble making turns. Other symptoms include: Being very sad, worried, or nervous. Having delusions. These are strong beliefs that aren't true. Having hallucinations. This is when you see, hear, taste, smell, or feel things that aren't real. Trouble speaking or swallowing. Trouble pooping (constipation). Needing to pee (urinate) right away, peeing often, or losing control of when you pee or poop. Sleep problems. How is this diagnosed? A diagnosis may be made based on symptoms, your medical history, and a physical exam. You may also have imaging tests that make pictures of your brain. How is this treated? There is no cure for Parkinson's disease. The goal of treatment is to manage your symptoms. It may include: Medicines. Therapy to help with talking or movement. Surgery to reduce shaking and other movements that you can't control. Follow these instructions at home: Medicines Take your medicines only as told by your health care provider. Avoid taking pain or sleeping medicines. These can affect your thinking. Activity Ask your provider if it's safe for you to drive. Exercise as told by your provider or physical therapist. Lifestyle  Put grab bars and railings in your home, especially near the toilet and in the shower. These help prevent falls. Do not smoke, vape, or use products with nicotine or tobacco in them. If you need help quitting, talk with your provider. Do not drink alcohol . General instructions Talk with your provider to find out what kind of help you need at home. Ask about ways to stay safe. Join  a support group for people with Parkinson's disease. Where to find more information [employer] of Neurological Disorders and Stroke: BasicFM.no Parkinson's Foundation: parkinson.org Contact a health care provider if: Medicines don't help your symptoms. You have a  lot of side effects from your medicines. You keep losing your balance or you fall. You need more help at home. You have trouble swallowing. You have a very hard time pooping. You feel very sad, worried, or confused. You see, hear, taste, smell, or feel things that aren't real. Get help right away if: You were hurt in a fall. You can't swallow without choking. You have chest pain or trouble breathing. You don't feel safe at home. These symptoms may be an emergency. Call 911 right away. Do not wait to see if the symptoms will go away. Do not drive yourself to the hospital. Also, get help right away if: You feel like you may hurt yourself or others. You have thoughts about taking your own life. Take one of these steps: Go to your nearest emergency room. Call 911. Call the National Suicide Prevention Lifeline at 210-476-6091 or 988. Text the Crisis Text Line at 670-211-9622. This information is not intended to replace advice given to you by your health care provider. Make sure you discuss any questions you have with your health care provider. Document Revised: 04/30/2023 Document Reviewed: 10/13/2022 Elsevier Patient Education  2024 [employer].

## 2024-03-10 ENCOUNTER — Telehealth: Payer: Self-pay | Admitting: Cardiology

## 2024-03-10 ENCOUNTER — Telehealth: Payer: Self-pay | Admitting: Neurology

## 2024-03-10 MED ORDER — CARBIDOPA-LEVODOPA ER 25-100 MG PO TBCR: 1.0000 | EXTENDED_RELEASE_TABLET | Freq: Two times a day (BID) | ORAL | 2 refills | Status: DC

## 2024-03-10 NOTE — Telephone Encounter (Signed)
 I had d/c'd coreg  at the last office visit due to soft BP.  Not sure if it was added back by another provider.   Jizel Cheeks [place], DO, FACC

## 2024-03-10 NOTE — Telephone Encounter (Signed)
 Patient's wife, Kunal Levario dosage was changed at last office visit for carbidopa -levodopa  (SINEMET  IR) 25-100 MG tablet would be the long acting one (wife unsure if this is the right one). Need prescription sent to the pharmacy:[facility] [phone]. Ms. Bong asking for a call back from the nurse to make sure this is the right medication that was changed.

## 2024-03-10 NOTE — Telephone Encounter (Signed)
 Spoke to patient's wife.She stated at last office visit with Dr.Tolia they were told can take Coreg  3.125 mg if B/P elevated.Stated husband does not have any Coreg  3.125 mg tablets.Stated for the past 3 days B/P 198/69,135/73,172/76,153/80.Pulse ranging in 60's.Stated he has been taking Coreg  6.25 mg daily the past 3 days and B/P still elevated.I will send message to Dr.Tolia for advice.

## 2024-03-10 NOTE — Addendum Note (Signed)
 Addended by: Liesa Tsan K on: 03/10/2024 09:59 AM   Modules accepted: Orders

## 2024-03-10 NOTE — Telephone Encounter (Signed)
 Spoke w/[facility] pharmacy who reported they did not receive a script for carbidopa /levodopa  25-100mg  CR 1 tab BID. Will send in new script for medication. Pharmacy did confirm the carbidopa /levodopa  25-100mg  IR was filled 90 tabs with 2 refills with instructions to take 1 tab at 8am, noon, and 4pm.

## 2024-03-10 NOTE — Telephone Encounter (Signed)
 Spoke w/Pt wife regarding medications. Discussed carbidopa /levodopa  dosage and how Pt is to take the ER as well as the IR. Pt wife stated the pharmacy reported they did not get a script for either medication. Informed wife we show scripts were sent for both on 02/17/24 and will contact pharmacy to determine if we need to send scripts again. Wife voiced understanding and thanks for the call.

## 2024-03-10 NOTE — Telephone Encounter (Signed)
 Pt spouse called in for a refill on Carvedilol  for 3.125mg  once daily. No longer on med list, please advise. He uses [facility]  [facility] [phone] - [place], [place] - [address] Phone: [phone]  Fax: [phone]

## 2024-03-10 NOTE — Telephone Encounter (Signed)
 Please clarify if patient should be on carvedilol . AVS and office visit state different things.

## 2024-03-11 MED ORDER — CARVEDILOL 6.25 MG PO TABS: 6.2500 mg | ORAL_TABLET | Freq: Two times a day (BID) | ORAL | 3 refills | Status: DC

## 2024-03-11 NOTE — Telephone Encounter (Signed)
 Spoke with pt and pt's wife and explained that Dr. Michele wanted pt to stay on current dose of Carvedilol  6.25 mg BID. Asked pt's wife if pt needed a refill or if they had enough and she stated they needed a refill. A new prescription was placed and sent to pt preferred pharmacy. Advised pt and pt's wife to continue to use the same hold parameters as before if SBP less than 100 and/ or if HR is less than 55. Pt and pt's wife both verbalized understanding of plan.

## 2024-03-11 NOTE — Telephone Encounter (Signed)
 Spoke with pt's wife who states these were the below readings and dates:  7/26-  AM 129/70 PM 155/75  7/27-  127/63   7/28-  141/74   7/30-  AM 198/69  *10 mins later* 135/73 PM 116/46   8/1-  AM 128/71 PM 142/75  *no carvedilol  today*

## 2024-03-11 NOTE — Telephone Encounter (Signed)
 If you remember he was having severe symptomatic orthostatic hypotension and blood pressure medications were weaned off. It looks like now he is hypertensive. According to the prior message he is taking carvedilol  6.25 twice daily, please asked the patient's daughter that his blood pressures were running on the current dosage.  Do not want to overcorrect given his history of of orthostasis.  Randall Gorton [place], DO, FACC

## 2024-03-11 NOTE — Telephone Encounter (Signed)
 Continue current dose of coreg  and update the [facility]  Devlyn Retter [place], DO, FACC

## 2024-03-28 ENCOUNTER — Telehealth: Payer: Self-pay | Admitting: Neurology

## 2024-03-28 ENCOUNTER — Other Ambulatory Visit: Payer: Self-pay | Admitting: *Deleted

## 2024-03-28 MED ORDER — CARBIDOPA-LEVODOPA 25-100 MG PO TABS: 1.0000 | ORAL_TABLET | ORAL | 0 refills | Status: DC

## 2024-03-28 MED ORDER — CARBIDOPA-LEVODOPA ER 25-100 MG PO TBCR: 1.0000 | EXTENDED_RELEASE_TABLET | Freq: Two times a day (BID) | ORAL | 1 refills | Status: DC

## 2024-03-28 NOTE — Telephone Encounter (Signed)
 Spoke to wife (checked DPR) both rx for sinemet  has been sent to pharmacy

## 2024-03-28 NOTE — Telephone Encounter (Signed)
 Spoke to wife made her aware both rx for sinemet   has been sent to pharmacy

## 2024-03-28 NOTE — Telephone Encounter (Signed)
 Pt wife called to request Medication refill Carbidopa -Levodopa  ER (SINEMET  CR) 25-100 MG tablet controlled release   Pt would like medication to be sent to   [id number] - [place], [place] - [address] (Ph: [phone])

## 2024-04-26 ENCOUNTER — Ambulatory Visit: Admitting: Neurology

## 2024-04-26 ENCOUNTER — Encounter: Payer: Self-pay | Admitting: Neurology

## 2024-04-26 VITALS — BP 131/82 | HR 69 | Ht 70.0 in | Wt 166.0 lb

## 2024-04-26 DIAGNOSIS — G20A1 Parkinson's disease without dyskinesia, without mention of fluctuations: Secondary | ICD-10-CM | POA: Diagnosis not present

## 2024-04-26 DIAGNOSIS — Z9181 History of falling: Secondary | ICD-10-CM

## 2024-04-26 DIAGNOSIS — Z8673 Personal history of transient ischemic attack (TIA), and cerebral infarction without residual deficits: Secondary | ICD-10-CM

## 2024-04-26 NOTE — Patient Instructions (Signed)
 It was nice to meet you all today.  Here is what we discussed today and I will make my recommendations in detail to Dr. Rosemarie as well.  Please keep your appointment with Harlene in January and see Dr. Rosemarie about 6 months after that.  You may be able to get a rolling walker through the [facility].  You can talk to your PCP at the [facility] about an order for a walker. Increase your water intake to about 4 bottles of water per day, 16.9 ounce size each. Continue with Sinemet  CR 1 pill twice daily at 10 AM and 10 PM daily. Use Sinemet  IR 3 times a day as previously discussed but take it at 7 AM, 11 AM and 4 PM daily. Try to stay active mentally and physically and try to exercise regularly in the form of walking if possible.
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
## 2024-04-27 ENCOUNTER — Telehealth: Payer: Self-pay | Admitting: Neurology

## 2024-04-27 MED ORDER — CARBIDOPA-LEVODOPA ER 25-100 MG PO TBCR: 1.0000 | EXTENDED_RELEASE_TABLET | Freq: Two times a day (BID) | ORAL | 5 refills | Status: DC

## 2024-04-27 NOTE — Telephone Encounter (Signed)
 Pt's wife called wanting to know if the pt's Carbidopa -Levodopa  ER (SINEMET  CR) 25-100 MG tablet controlled release can be called in for him to the [facility] in [place].

## 2024-04-27 NOTE — Telephone Encounter (Signed)
 Refilled for 6 months. Has appt in January with Harlene NP.

## 2024-04-28 ENCOUNTER — Other Ambulatory Visit (HOSPITAL_COMMUNITY): Payer: Self-pay

## 2024-04-28 ENCOUNTER — Telehealth: Payer: Self-pay

## 2024-04-28 NOTE — Telephone Encounter (Addendum)
 Spoke to pharmacy pt is needing a PA for r Carbidopa -Levodopa  ER (SINEMET  CR) 25-100 MG tablet controlled release  Per pharmacy since pt is completely out they can sell them some tablets for a couple of  days . Wife  ( DPR) aware and is calling pharmacy now and wife aware that PA has been sent as high priority

## 2024-04-28 NOTE — Telephone Encounter (Signed)
 Pharmacy Patient Advocate Encounter   Received notification from Physician's Office that prior authorization for Carbidopa -levodopa  is required/requested.   Insurance verification completed.   The patient is insured through [facility] .   Per test claim: PA required; PA submitted to above mentioned insurance via Latent Key/confirmation #/EOC [id number] Status is pending  Submitted as an URGENT request.

## 2024-04-28 NOTE — Telephone Encounter (Signed)
 Noted

## 2024-04-28 NOTE — Telephone Encounter (Signed)
   I will outreach insurance plan to submit PA-we do not have a copy of the PT pharmacy insurance benefits card so I cannot see how his name is entered on the card.

## 2024-04-28 NOTE — Telephone Encounter (Signed)
 Patient's wife said pharmacy informed need a Prior Authorization for Carbidopa -Levodopa  ER (SINEMET  CR) 25-100 MG tablet controlled release. Would like a call back.

## 2024-04-29 ENCOUNTER — Other Ambulatory Visit (HOSPITAL_COMMUNITY): Payer: Self-pay

## 2024-04-29 NOTE — Telephone Encounter (Signed)
 Spoke with patient. He states he now has [facility] aarp advantage plan that has drug coverage. I asked him to give this to his pharmacy today so they can try to fill the sinemet . He can pay cash for it if not, using a good rx coupon. I asked him to call us  back Monday and let us  know what he finds out.

## 2024-04-29 NOTE — Telephone Encounter (Signed)
 So upon investigating this PT doesn't have part D coverage which is what pays for the prescription coverage. He had [employer] but it expired April of 2025. When I run the eligibility tracker it states the only coverage it can pull for this PT is Part B coverage which is for medical not pharmacy. Unfortunately at this time I am unable to do a prior authorization unless the PT has part D coverage. Thank you!

## 2024-05-03 ENCOUNTER — Other Ambulatory Visit (HOSPITAL_COMMUNITY): Payer: Self-pay

## 2024-05-03 ENCOUNTER — Telehealth: Payer: Self-pay | Admitting: Cardiology

## 2024-05-03 MED ORDER — CARBIDOPA-LEVODOPA ER 25-100 MG PO TBCR: 1.0000 | EXTENDED_RELEASE_TABLET | Freq: Two times a day (BID) | ORAL | 5 refills | Status: DC

## 2024-05-03 MED ORDER — CARVEDILOL 6.25 MG PO TABS
6.2500 mg | ORAL_TABLET | Freq: Two times a day (BID) | ORAL | 2 refills | Status: DC
  Filled 2024-05-03: qty 180, 90d supply, fill #0

## 2024-05-03 NOTE — Telephone Encounter (Signed)
 Called pt and relayed message to him from [place]. Patient wanted me to call his wife. Spoke with wife and explained message to her. She verbalized understanding and said to send his Rx to [facility] pharmacy instead. Discussed options for discount (good rx or [email or web address]) if needed in future. She was appreciative.   Refills of sinemet  cr sent to [facility] pharmacy.

## 2024-05-03 NOTE — Telephone Encounter (Signed)
 I called pt.  He has [facility] UCARD  ID # [id number] PCN [id number] (HMO-POS) GRPT # [id number] Payor ID [id number] Pharmacy # [phone] I relayed to upload to mychart front and back of card as well.

## 2024-05-03 NOTE — Telephone Encounter (Signed)
 I called [facility] to get pharmacy benefit info-they gave me the same info that I already had and it is the part B coverage only it did not include part D for pharmacy medications. I am unable to perform a PA with that insurance.

## 2024-05-03 NOTE — Telephone Encounter (Signed)
 RX sent in

## 2024-05-03 NOTE — Telephone Encounter (Signed)
 I called [employer] and they state that the PT only has part B coverage with them. I would advise PT to call his insurance to discuss and maybe they can better explain it to the patient.

## 2024-05-03 NOTE — Telephone Encounter (Signed)
 Patient's wife, Kashon Kraynak said Contacted the pharmacy and they have not received PA for Carbidopa -levodopa . Would like to know what's taking so long because we are having to buy medication by the week.

## 2024-05-03 NOTE — Telephone Encounter (Signed)
*  STAT* If patient is at the pharmacy, call can be transferred to refill team.   1. Which medications need to be refilled? (please list name of each medication and dose if known) carvedilol  (COREG ) 6.25 MG tablet   NEW PHARMACY   4. Which pharmacy/location (including street and city if local pharmacy) is medication to be sent to?  [facility] - [place], [place] - [address] Phone: [phone]  Fax: [phone]       5. Do they need a 30 day or 90 day supply? 90

## 2024-05-04 ENCOUNTER — Telehealth: Payer: Self-pay

## 2024-05-04 ENCOUNTER — Telehealth: Payer: Self-pay | Admitting: Neurology

## 2024-05-04 MED ORDER — CARBIDOPA-LEVODOPA 25-100 MG PO TABS: ORAL_TABLET | ORAL | 0 refills | Status: DC

## 2024-05-04 NOTE — Telephone Encounter (Signed)
 RFS Form req. Back dated auth to cover 04/26/24 appt and future appts emailed to the [facility]

## 2024-05-04 NOTE — Telephone Encounter (Signed)
 Spoke w/Linda at [employer]. She wanted to verify Pt to be on both CR and IR of carbidopa -levodopa . Informed Linda per notes in last visit he is to continue both. Rock stated a new script is needed for sinemet  IR 25-100 mg with the dosage times as noted in OV. Advised a new script will be sent. Rock voiced thanks for the info and call back.

## 2024-05-04 NOTE — Telephone Encounter (Signed)
 Linda from Pharmacy called to  get clarification on Pt medication   carbidopa -levodopa  (SINEMET  IR) 25-100 MG tablet  Carbidopa -Levodopa  ER (SINEMET  CR) 25-100 MG tablet controlled releas    [facility] callback number is  [phone] ext [phone]

## 2024-05-12 ENCOUNTER — Other Ambulatory Visit (HOSPITAL_COMMUNITY): Payer: Self-pay

## 2024-06-02 ENCOUNTER — Telehealth: Payer: Self-pay | Admitting: Cardiology

## 2024-06-02 MED ORDER — CARVEDILOL 6.25 MG PO TABS: 6.2500 mg | ORAL_TABLET | Freq: Two times a day (BID) | ORAL | 2 refills | Status: DC

## 2024-06-02 NOTE — Telephone Encounter (Signed)
 RX sent in

## 2024-06-02 NOTE — Telephone Encounter (Signed)
*  STAT* If patient is at the pharmacy, call can be transferred to refill team.   1. Which medications need to be refilled? (please list name of each medication and dose if known) carvedilol  (COREG ) 6.25 MG tablet    2. Would you like to learn more about the convenience, safety, & potential cost savings by using the [facility]?     3. Are you open to using the [facility] (Type [facility].  ).   4. Which pharmacy/location (including street and city if local pharmacy) is medication to be sent to? [facility] - [place], [place] - [address]    5. Do they need a 30 day or 90 day supply? 90 day

## 2024-06-23 ENCOUNTER — Telehealth: Payer: Self-pay | Admitting: Cardiology

## 2024-06-23 NOTE — Telephone Encounter (Signed)
 Wife Quentin) wants to know if patient should continue to monitor his BP twice daily going forward.

## 2024-06-23 NOTE — Telephone Encounter (Signed)
 Called patient's wife (DPR) she stated patient's HR goes up and down and if it's lower than 60 she holds patient's medication. She stated she is going to continue checking BP and HR before she give medication, because she does not want to give him anything if it's already to low. Encouraged her to keep a log, so Dr. Michele can review it at his next office visit.

## 2024-07-04 ENCOUNTER — Telehealth: Payer: Self-pay | Admitting: Neurology

## 2024-07-04 NOTE — Telephone Encounter (Signed)
 Whom would you suggest?

## 2024-07-04 NOTE — Telephone Encounter (Signed)
 Pt's daughter is asking for a call to discuss bouts of anxiety pt is having asa result of his Parkinson's and 2 strokes , she is asking who can see pt for this and where are suggested places pt can be seen, please call.

## 2024-07-04 NOTE — Telephone Encounter (Signed)
 This is a patient of Dr. Bucky.  Forwarding to him.

## 2024-07-05 NOTE — Telephone Encounter (Signed)
 I called and spoke to daughter, Delon, ok per DPR.  Relayed to her from Dr. Rosemarie that he recommended to have pcp address issues with anxiety/ PD.  [facility].  She appreciated call back, and will reach out to them again, (has to go to [facility]).

## 2024-08-17 ENCOUNTER — Ambulatory Visit: Attending: Cardiovascular Disease | Admitting: Cardiology

## 2024-08-17 ENCOUNTER — Encounter: Payer: Self-pay | Admitting: Cardiology

## 2024-08-17 VITALS — BP 122/71 | HR 67 | Resp 16 | Ht 70.0 in | Wt 163.2 lb

## 2024-08-17 DIAGNOSIS — G20A1 Parkinson's disease without dyskinesia, without mention of fluctuations: Secondary | ICD-10-CM | POA: Diagnosis not present

## 2024-08-17 DIAGNOSIS — I719 Aortic aneurysm of unspecified site, without rupture: Secondary | ICD-10-CM | POA: Diagnosis not present

## 2024-08-17 DIAGNOSIS — I951 Orthostatic hypotension: Secondary | ICD-10-CM

## 2024-08-17 DIAGNOSIS — E782 Mixed hyperlipidemia: Secondary | ICD-10-CM

## 2024-08-17 DIAGNOSIS — I2510 Atherosclerotic heart disease of native coronary artery without angina pectoris: Secondary | ICD-10-CM

## 2024-08-17 DIAGNOSIS — I10 Essential (primary) hypertension: Secondary | ICD-10-CM

## 2024-08-17 MED ORDER — CARVEDILOL 6.25 MG PO TABS: 6.2500 mg | ORAL_TABLET | Freq: Two times a day (BID) | ORAL | 2 refills | Status: AC

## 2024-08-17 NOTE — Patient Instructions (Signed)
 Medication Instructions:  Your physician recommends that you continue on your current medications as directed. Please refer to the Current Medication list given to you today.  *If you need a refill on your cardiac medications before your next appointment, please call your pharmacy*   Follow-Up: At [facility], you and your health needs are our priority.  As part of our continuing mission to provide you with exceptional heart care, our providers are all part of one team.  This team includes your primary Cardiologist (physician) and Advanced Practice Providers or APPs (Physician Assistants and Nurse Practitioners) who all work together to provide you with the care you need, when you need it.  Your next appointment:   6 month(s)  Provider:   Madonna Large, DO

## 2024-08-17 NOTE — Progress Notes (Signed)
 " Cardiology Office Note:  .   Date:  08/17/2024  ID:  Randall JINNY Janit Mickey., DOB [birth year], MRN [id number] PCP:  System, Provider Not In  Former Cardiology Providers: Dr. Delford and Dr. Lad[facility] Providers Cardiologist:  Madonna Large, DO , [facility] (established care 01/11/2021) Electrophysiologist:  None  Click to update primary MD,subspecialty MD or APP then REFRESH:1}    Chief Complaint  Patient presents with   Orthostatic hypotension   Follow-up    History of Present Illness: .   Randall Fedor. is a 79 y.o. 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   He has nonobstructive CAD in the LAD distribution with a component of coronary vasospasm/microvascular angina based on his last coronary angiogram.  He was placed on amlodipine /Imdur .  Aortic discontinued due to soft blood pressures.  In the past he had a mechanical fall while cutting trees was hospitalized due to orbital fractures, T2/T3 fractures, abrasions, and scalp lacerations.  In the past he had CT of the chest which noted penetrating aortic ulcer on the underside of the mid aortic arch with surrounding intramural hemorrhage/mural thickening.  He was evaluated by cardiothoracic surgeon Dr. Lucas as well as vascular surgery.  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  Patient was discharged home and later presented to the hospital and was diagnosed with acute/subacute stroke.  No obvious residual defects with the exception of some cognitive impairment.    In the recent past he been having issues with orthostatic hypotension.  Amlodipine  and Imdur  were weaned off.  He remains on carvedilol  with holding parameters.  The component of his lightheaded and dizziness may also be secondary to his underlying Parkinson's disease.  Patient was recommended to increase fluids, change positions slowly, avoid dehydration, wear compression stockings.  Patient presents today for 20-month follow-up visit.  He is accompanied by his wife and daughter.  He denies anginal chest pain or heart failure symptoms. No near-syncope or syncopal events. Occasionally when he lays supine he may have 1 episode of spinning and then the symptoms resolved. He is doing well on carvedilol . Home blood pressure log reviewed.   Review of Systems: .   Review of Systems  Cardiovascular:  Negative for chest pain, claudication, dyspnea on exertion, irregular heartbeat, leg swelling, near-syncope, orthopnea, palpitations, paroxysmal nocturnal dyspnea and syncope.  Respiratory:  Negative for shortness of breath.   Hematologic/Lymphatic: Negative for bleeding problem.  Musculoskeletal:  Negative for muscle cramps and myalgias.  Neurological:  Positive for tremors (Resting) and vertigo.    Studies Reviewed:   EKG Interpretation Date/Time:  Wednesday August 17 2024 13:44:07 EST Ventricular Rate:  66 PR Interval:  172 QRS Duration:  90 QT Interval:  394 QTC Calculation: 413 R Axis:   77  Text Interpretation: Sinus rhythm with marked sinus arrhythmia When compared with ECG of 15-Feb-2024 15:32, No significant change was found Confirmed by Large Madonna [phone]) on 08/17/2024 2:04:16 PM  Echocardiogram: 09/02/2023 LVEF 55 to 60%, grade 1 diastolic dysfunction. Right ventricular size is mildly dilated, function normal. Mild MR Trivial AR Ascending aorta 41 mm, dilated. Estimated RAP 8 mmHg.  Left heart catheterization  02/05/2021: LV 110/1, EDP 9 mmHg.  Ao 112/59, mean 81 mmHg.   There was no pressure clinical statical. LM: Smooth and normal. LAD: Proximal LAD smooth 40% stenosis.  There is minimal disease in the midsegment.  Moderate-sized D1 and D2. Ramus: Large vessel, has secondary branches.  Minimal disease is evident. RCA: Dominant.  Very mild disease in the PL branch.   Recommendation: Patient had very similar anatomy in 2017 angiography.  No change in the coronary anatomy, previously he has had FFR/PressureWire evaluation of the proximal LAD stenosis.  Hence I do not think he needs further evaluation of this lesion by physiologic means.  Suspect the lesion is only moderate.  Suspect microvascular angina or coronary spasm to be the etiology, we will try isosorbide  mononitrate 60 mg daily.  30 mill contrast utilized.   RADIOLOGY: CT Angio Chest Aorta W and/or Wo Contrast 07/25/2023 1. Penetrating atherosclerotic ulcer with small amount of surrounding intramural hemorrhage or mural thickening demonstrating slight interval increase in size since prior examination of 02/07/2022. No mediastinal hematoma. No aortic dissection or aneurysm. 2. Fluid-filled esophagus suggesting changes of gastroesophageal reflux or esophageal dysmotility. 3. Stable compression deformities of T2 and T3.   Aortic Atherosclerosis (ICD10-I70.0).  MRI Brain w/o contrast  09/11/2023 1. 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 2. Additional punctate focus of increased signal on diffusion-weighted imaging without ADC correlate in the left posterior temporal lobe, which may represent a subacute infarct.  Risk Assessment/Calculations:   NA   Labs:       Latest Ref Rng & Units 02/01/2024    6:55 PM 09/12/2023    2:40 AM 09/11/2023   12:45 PM  CBC  WBC 4.0 - 10.5 K/uL 5.1  8.6  9.2   Hemoglobin 13.0 - 17.0 g/dL 85.3  86.5  86.6   Hematocrit 39.0 - 52.0 % 40.3  38.9  36.9   Platelets 150 - 400 K/uL 134  113  110        Latest Ref Rng & Units 02/01/2024    6:55 PM 09/12/2023    2:40  AM 09/11/2023   12:45 PM  BMP  Glucose 70 - 99 mg/dL 896  888  96   BUN 8 - 23 mg/dL 23  16  14    Creatinine 0.61 - 1.24 mg/dL 8.77  8.82  8.81   Sodium 135 - 145 mmol/L 139  136  136   Potassium 3.5 - 5.1 mmol/L 4.4  4.1  3.5   Chloride 98 - 111 mmol/L 102  105  104   CO2 22 - 32 mmol/L 27  21  24    Calcium  8.9 - 10.3 mg/dL 9.1  8.2  7.9       Latest Ref Rng & Units 02/01/2024    6:55 PM 09/12/2023    2:40 AM 09/11/2023   12:45 PM  CMP  Glucose 70 - 99 mg/dL 896  888  96   BUN 8 - 23 mg/dL 23  16  14    Creatinine 0.61 - 1.24 mg/dL 8.77  8.82  8.81   Sodium 135 - 145 mmol/L 139  136  136   Potassium 3.5 - 5.1 mmol/L 4.4  4.1  3.5   Chloride 98 - 111 mmol/L 102  105  104   CO2 22 - 32 mmol/L 27  21  24    Calcium  8.9 - 10.3 mg/dL 9.1  8.2  7.9   Total Protein 6.5 - 8.1 g/dL 6.3  5.5  5.3  Total Bilirubin 0.0 - 1.2 mg/dL 1.0  2.4  1.4   Alkaline Phos 38 - 126 U/L 89  79  77   AST 15 - 41 U/L 19  16  16    ALT 0 - 44 U/L 8  <5  9     Lab Results  Component Value Date   CHOL 91 09/13/2023   HDL 31 (L) 09/13/2023   LDLCALC 47 09/13/2023   TRIG 67 09/13/2023   CHOLHDL 2.9 09/13/2023   No results for input(s): LIPOA in the last 8760 hours. No components found for: NTPROBNP No results for input(s): PROBNP in the last 8760 hours. No results for input(s): TSH in the last 8760 hours.  Physical Exam:    Today's Vitals   08/17/24 1343  BP: 122/71  Pulse: 67  Resp: 16  SpO2: 98%  Weight: 163 lb 3.2 oz (74 kg)  Height: 5' 10 (1.778 m)   Body mass index is 23.42 kg/m. Wt Readings from Last 3 Encounters:  08/17/24 163 lb 3.2 oz (74 kg)  04/26/24 166 lb (75.3 kg)  02/17/24 165 lb (74.8 kg)   Orthostatic VS for the past 72 hrs (Last 3 readings):  Patient Position BP Location Cuff Size  08/17/24 1343 Sitting Right Arm Normal  Measurements performed on right upper extremity   Physical Exam  Constitutional: No distress.  Age appropriate, hemodynamically stable.    Neck: No JVD present.  Cardiovascular: Normal rate, regular rhythm, S1 normal, S2 normal, intact distal pulses and normal pulses. Exam reveals no gallop, no S3 and no S4.  No murmur heard. Pulmonary/Chest: Effort normal and breath sounds normal. No stridor. He has no wheezes. He has no rales.  Abdominal: Soft. Bowel sounds are normal. He exhibits no distension. There is no abdominal tenderness.  Musculoskeletal:        General: No edema.     Cervical back: Neck supple.  Neurological: He is alert and oriented to person, place, and time. He has intact cranial nerves (2-12).  Skin: Skin is warm and moist.    Impression & Recommendation(s):  Impression:   ICD-10-CM   1. Orthostatic hypotension  I95.1 EKG 12-Lead    2. Benign hypertension  I10     3. Coronary artery disease involving native coronary artery of native heart without angina pectoris  I25.10     4. Mixed hyperlipidemia  E78.2     5. Penetrating atherosclerotic ulcer of aorta  I71.9     6. Parkinson's disease, unspecified whether dyskinesia present, unspecified whether manifestations fluctuate (HCC)  G20.A1        Recommendation(s):   Orthostatic hypotension Chronic and stable. No reoccurrence. Continue carvedilol  6.25 mg p.o. twice daily hold if SBP less than 120 mmHg or heart rate less than 50 bpm Reemphasized importance of adequate hydration and changing positions slowly Avoid dehydration. Compression stockings for 6-8 hours/day as tolerated during awakening hours Avoiding straining, coughing, and walking in hot weather.These activities reduce venous return and worsen orthostatic hypotension. Avoiding over-heating. Raising the head of the bed 30 to 45 degrees decreases renal perfusion, thereby activating the renin-angiotensin-aldosterone system and decreasing nocturnal diuresis, which can be pronounced in these patients. Exercise - walking 30 minutes a day, exercise in a swimming pool, exercise in a recumbent or  seated position (using a stationary bike or rowing machine) May increase salt intake on days when symptoms occur.  Increase water intake to 2 L to 2.5 L of water a day Physical Countermaneuvers during daily  activities: leg crossing, standing on tip toes, squatting Modification of meals: Avoiding large meals Ingesting meals low in carbohydrates Alcohol  should be avoided during the day as it is a vasodilator Drink water with meals Avoiding activities or sudden standing immediately after eating   Benign hypertension Medications as discussed above  Coronary artery disease involving native coronary artery of native heart without angina pectoris Denies anginal chest pain or heart failure symptoms. Left heart catheterization June 2022-nonobstructive disease in the LAD likely secondary to microvascular angina/coronary spasm Imdur  and amlodipine  discontinued in the past due to orthostasis  Mixed hyperlipidemia Currently on Lipitor  80 mg p.o. nightly.  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 Home and office blood pressures are well-controlled - meds changes as discussed above. Noted on a CT scan from December 2024. S/p Endovascular repair of thoracic aortic aneurysm with coverage of left subclavian artery 09/09/2023 Will defer surveillance to vascular surgery, patient will call their office for follow-up  Parkinson's disease, unspecified whether dyskinesia present, unspecified whether manifestations fluctuate (HCC) Encouraged the family to follow-up with neurology as well given the symptoms. Follows with neurology at the [facility].  Orders Placed:  Orders Placed This Encounter  Procedures   EKG 12-Lead     Final Medication List:    Meds ordered this encounter  Medications   carvedilol  (COREG ) 6.25 MG tablet    Sig: Take 1 tablet (6.25 mg total) by mouth 2 (two) times daily.     Dispense:  180 tablet    Refill:  2    Medications Discontinued During This Encounter  Medication Reason   zolpidem  (AMBIEN ) 10 MG tablet Patient Preference   polyethylene glycol (MIRALAX ) 17 g packet Patient Preference   carvedilol  (COREG ) 6.25 MG tablet Reorder     Current Outpatient Medications:    acetaminophen  (TYLENOL ) 500 MG tablet, Take 1,000 mg by mouth daily as needed for mild pain (pain score 1-3)., Disp: , Rfl:    aspirin  EC 81 MG tablet, Take 81 mg by mouth at bedtime., Disp: , Rfl:    atorvastatin  (LIPITOR ) 80 MG tablet, Take 80 mg by mouth every evening., Disp: , Rfl:    B Complex-C (SUPER B-C PO), Take 1 tablet by mouth every evening., Disp: , Rfl:    carbidopa -levodopa  (SINEMET  IR) 25-100 MG tablet, Take 1 tablet by mouth at 7 AM, 1 tablet at 11 AM and 1 tablet at 4 PM, Disp: 90 tablet, Rfl: 0   Carbidopa -Levodopa  ER (SINEMET  CR) 25-100 MG tablet controlled release, Take 1 tablet by mouth 2 (two) times daily. Take 1 tablet with breakfast and bedtime, Disp: 60 tablet, Rfl: 5   EPINEPHrine 0.3 mg/0.3 mL IJ SOAJ injection, Inject 0.3 mg into the muscle as needed for anaphylaxis. , Disp: , Rfl:    ferrous sulfate  325 (65 FE) MG tablet, Take 325 mg by mouth every other day. At night., Disp: , Rfl:    hydrOXYzine (VISTARIL) 25 MG capsule, Take 75 mg by mouth., Disp: , Rfl:    Multiple Vitamin (MULTIVITAMIN WITH MINERALS) TABS tablet, Take 1 tablet by mouth in the morning., Disp: , Rfl:    nitroGLYCERIN  (NITROSTAT ) 0.4 MG SL tablet, Place 0.4 mg under the tongue every 5 (five) minutes as needed for chest pain. , Disp: , Rfl:    Omega-3 Fatty Acids (OMEGA-3 PO), Take 1 capsule by mouth in the morning., Disp: , Rfl:    paroxetine (PAXIL) 10  MG/5ML suspension, Take by mouth., Disp: , Rfl:    traZODone (DESYREL) 50 MG tablet, Take 50 mg by mouth at bedtime as needed. for sleep, Disp: , Rfl:    carvedilol  (COREG ) 6.25 MG tablet, Take 1 tablet (6.25 mg total) by mouth 2 (two) times  daily., Disp: 180 tablet, Rfl: 2  Consent:   NA  Disposition:    67-month follow-up sooner if needed  His questions and concerns were addressed to his satisfaction. He voices understanding of the recommendations provided during this encounter.    Signed, Madonna Michele HAS, [facility]  A Division of [facility] [address], [place], [place] [address]  08/17/2024  "

## 2024-08-29 ENCOUNTER — Ambulatory Visit: Admitting: Adult Health

## 2024-08-29 ENCOUNTER — Encounter: Payer: Self-pay | Admitting: Adult Health

## 2024-08-29 VITALS — BP 112/66 | HR 71 | Ht 70.0 in | Wt 164.8 lb

## 2024-08-29 DIAGNOSIS — G3184 Mild cognitive impairment, so stated: Secondary | ICD-10-CM

## 2024-08-29 DIAGNOSIS — G20A1 Parkinson's disease without dyskinesia, without mention of fluctuations: Secondary | ICD-10-CM | POA: Diagnosis not present

## 2024-08-29 DIAGNOSIS — Z8673 Personal history of transient ischemic attack (TIA), and cerebral infarction without residual deficits: Secondary | ICD-10-CM

## 2024-08-29 MED ORDER — CARBIDOPA-LEVODOPA 25-100 MG PO TABS: ORAL_TABLET | ORAL | 3 refills | Status: AC

## 2024-08-29 MED ORDER — CARBIDOPA-LEVODOPA ER 25-100 MG PO TBCR: 1.0000 | EXTENDED_RELEASE_TABLET | Freq: Two times a day (BID) | ORAL | 3 refills | Status: AC
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
## 2024-08-29 NOTE — Patient Instructions (Addendum)
 Your Plan:  Continue Sinemet  IR and CR at current dosages for now  Referral placed to physical therapy at [facility] - they will call you to get this scheduled         Follow up in 6 months or call earlier if needed      Thank you for coming to see us  at [facility]. I hope we have been able to provide you high quality care today.  You may receive a patient satisfaction survey over the next few weeks. We would appreciate your feedback and comments so that we may continue to improve ourselves and the health of our patients.

## 2024-09-09 ENCOUNTER — Other Ambulatory Visit: Payer: Self-pay | Admitting: *Deleted

## 2024-09-09 ENCOUNTER — Encounter: Payer: Self-pay | Admitting: Surgery

## 2024-09-09 DIAGNOSIS — I7123 Aneurysm of the descending thoracic aorta, without rupture: Secondary | ICD-10-CM

## 2024-09-27 ENCOUNTER — Ambulatory Visit: Admitting: Neurology

## 2024-10-07 ENCOUNTER — Other Ambulatory Visit

## 2024-10-31 ENCOUNTER — Ambulatory Visit: Admitting: Surgery

## 2025-02-22 ENCOUNTER — Ambulatory Visit: Admitting: Adult Health

## 2025-03-13 ENCOUNTER — Ambulatory Visit: Admitting: Neurology
